# Patient Record
Sex: Female | Born: 1950
Health system: Southern US, Community
[De-identification: ages and names within clinical notes are randomized; demographics above are authoritative.]

## PROBLEM LIST (undated history)

## (undated) DIAGNOSIS — T7840XA Allergy, unspecified, initial encounter: Secondary | ICD-10-CM

## (undated) DIAGNOSIS — I10 Essential (primary) hypertension: Secondary | ICD-10-CM

## (undated) DIAGNOSIS — E119 Type 2 diabetes mellitus without complications: Secondary | ICD-10-CM

## (undated) DIAGNOSIS — R42 Dizziness and giddiness: Secondary | ICD-10-CM

## (undated) DIAGNOSIS — Z5189 Encounter for other specified aftercare: Secondary | ICD-10-CM

## (undated) DIAGNOSIS — T8859XA Other complications of anesthesia, initial encounter: Secondary | ICD-10-CM

## (undated) DIAGNOSIS — M1990 Unspecified osteoarthritis, unspecified site: Secondary | ICD-10-CM

## (undated) HISTORY — PX: FOOT SURGERY: SHX648

## (undated) HISTORY — DX: Encounter for other specified aftercare: Z51.89

## (undated) HISTORY — PX: ABDOMINAL HYSTERECTOMY: SHX81

## (undated) HISTORY — PX: TONSILLECTOMY: SUR1361

## (undated) HISTORY — DX: Allergy, unspecified, initial encounter: T78.40XA

---

## 2000-09-05 ENCOUNTER — Emergency Department (HOSPITAL_COMMUNITY): Admission: EM | Admit: 2000-09-05 | Discharge: 2000-09-05 | Payer: Self-pay | Admitting: *Deleted

## 2000-10-05 ENCOUNTER — Ambulatory Visit (HOSPITAL_COMMUNITY): Admission: RE | Admit: 2000-10-05 | Discharge: 2000-10-05 | Payer: Self-pay | Admitting: Internal Medicine

## 2001-03-16 ENCOUNTER — Encounter: Payer: Self-pay | Admitting: *Deleted

## 2001-03-16 ENCOUNTER — Encounter: Payer: Self-pay | Admitting: Emergency Medicine

## 2001-03-16 ENCOUNTER — Emergency Department (HOSPITAL_COMMUNITY): Admission: EM | Admit: 2001-03-16 | Discharge: 2001-03-16 | Payer: Self-pay | Admitting: *Deleted

## 2001-03-28 ENCOUNTER — Encounter: Payer: Self-pay | Admitting: Emergency Medicine

## 2001-03-28 ENCOUNTER — Emergency Department (HOSPITAL_COMMUNITY): Admission: EM | Admit: 2001-03-28 | Discharge: 2001-03-28 | Payer: Self-pay | Admitting: Emergency Medicine

## 2011-07-31 ENCOUNTER — Other Ambulatory Visit (HOSPITAL_COMMUNITY): Payer: Self-pay | Admitting: Internal Medicine

## 2011-07-31 DIAGNOSIS — Z1231 Encounter for screening mammogram for malignant neoplasm of breast: Secondary | ICD-10-CM

## 2011-08-26 ENCOUNTER — Ambulatory Visit (HOSPITAL_COMMUNITY): Payer: Self-pay

## 2011-09-16 ENCOUNTER — Ambulatory Visit (HOSPITAL_COMMUNITY): Payer: Self-pay | Attending: Internal Medicine

## 2012-05-03 ENCOUNTER — Encounter (HOSPITAL_BASED_OUTPATIENT_CLINIC_OR_DEPARTMENT_OTHER): Payer: Self-pay | Admitting: *Deleted

## 2012-05-03 ENCOUNTER — Emergency Department (HOSPITAL_BASED_OUTPATIENT_CLINIC_OR_DEPARTMENT_OTHER)
Admission: EM | Admit: 2012-05-03 | Discharge: 2012-05-03 | Disposition: A | Discharge status: Home / Self Care | Payer: Self-pay | Attending: Emergency Medicine | Admitting: Emergency Medicine

## 2012-05-03 ENCOUNTER — Emergency Department (HOSPITAL_BASED_OUTPATIENT_CLINIC_OR_DEPARTMENT_OTHER): Payer: Self-pay

## 2012-05-03 DIAGNOSIS — J9801 Acute bronchospasm: Secondary | ICD-10-CM | POA: Insufficient documentation

## 2012-05-03 DIAGNOSIS — B349 Viral infection, unspecified: Secondary | ICD-10-CM

## 2012-05-03 DIAGNOSIS — R05 Cough: Secondary | ICD-10-CM | POA: Insufficient documentation

## 2012-05-03 DIAGNOSIS — J3489 Other specified disorders of nose and nasal sinuses: Secondary | ICD-10-CM | POA: Insufficient documentation

## 2012-05-03 DIAGNOSIS — Z79899 Other long term (current) drug therapy: Secondary | ICD-10-CM | POA: Insufficient documentation

## 2012-05-03 DIAGNOSIS — I10 Essential (primary) hypertension: Secondary | ICD-10-CM | POA: Insufficient documentation

## 2012-05-03 DIAGNOSIS — R059 Cough, unspecified: Secondary | ICD-10-CM | POA: Insufficient documentation

## 2012-05-03 DIAGNOSIS — R062 Wheezing: Secondary | ICD-10-CM | POA: Insufficient documentation

## 2012-05-03 DIAGNOSIS — Z87891 Personal history of nicotine dependence: Secondary | ICD-10-CM | POA: Insufficient documentation

## 2012-05-03 DIAGNOSIS — E119 Type 2 diabetes mellitus without complications: Secondary | ICD-10-CM | POA: Insufficient documentation

## 2012-05-03 DIAGNOSIS — B9789 Other viral agents as the cause of diseases classified elsewhere: Secondary | ICD-10-CM | POA: Insufficient documentation

## 2012-05-03 HISTORY — DX: Dizziness and giddiness: R42

## 2012-05-03 HISTORY — DX: Essential (primary) hypertension: I10

## 2012-05-03 HISTORY — DX: Type 2 diabetes mellitus without complications: E11.9

## 2012-05-03 MED ORDER — ALBUTEROL SULFATE HFA 108 (90 BASE) MCG/ACT IN AERS
2.0000 | INHALATION_SPRAY | Freq: Once | RESPIRATORY_TRACT | Status: AC
  Administered 2012-05-03: 2 via RESPIRATORY_TRACT

## 2012-05-03 MED ORDER — PREDNISONE 20 MG PO TABS: ORAL_TABLET | ORAL | Status: DC

## 2012-05-03 MED ORDER — PREDNISONE 50 MG PO TABS
60.0000 mg | ORAL_TABLET | Freq: Once | ORAL | Status: AC
  Administered 2012-05-03: 60 mg via ORAL
  Filled 2012-05-03: qty 1

## 2012-05-03 MED ORDER — ALBUTEROL SULFATE HFA 108 (90 BASE) MCG/ACT IN AERS
INHALATION_SPRAY | RESPIRATORY_TRACT | Status: AC
  Filled 2012-05-03: qty 6.7

## 2012-05-03 NOTE — ED Notes (Signed)
Pt EMS transport for work= c/o SOB- 5 albuterol given pta

## 2012-05-03 NOTE — ED Provider Notes (Signed)
History     CSN: [id number]  Arrival date & time 05/03/12  0941   First MD Initiated Contact with Patient 05/03/12 [phone]      Chief Complaint  Patient presents with  . Shortness of Breath    (Consider location/radiation/quality/duration/timing/severity/associated sxs/prior treatment) HPI This 62 year old female has chronic nasal congestion but today history of increasing clear rhinorrhea and increasing is a congestion and a nonproductive cough and this morning felt like she was wheezing a little short of breath, EMS found her with a normal room-air pulse oximetry 100% for wheezing on examination which cleared with one albuterol nebulizer prior to arrival, patient is no longer short of breath, she does have a slight twinge of right posterior back pain with coughing or taking a deep breath but no anterior chest pain no exertional chest pain no abdominal pain no vomiting no diarrhea no fever no rashes no body aches no confusion. Treatment prior to arrival consisted of albuterol nebulizer. Patient is still coughing upon arrival but feels much better. Past Medical History  Diagnosis Date  . Hypertension   . Vertigo   . Diabetes mellitus without complication     "pre-diabetes"    Past Surgical History  Procedure Laterality Date  . Tonsillectomy    . Foot surgery    . Abdominal hysterectomy      No family history on file.  History  Substance Use Topics  . Smoking status: Former Games developer  . Smokeless tobacco: Never Used  . Alcohol Use: No    OB History   Grav Para Term Preterm Abortions TAB SAB Ect Mult Living                  Review of Systems 10 Systems reviewed and are negative for acute change except as noted in the HPI. Allergies  Red dye  Home Medications   Current Outpatient Rx  Name  Route  Sig  Dispense  Refill  . lisinopril (PRINIVIL,ZESTRIL) 10 MG tablet   Oral   Take 20 mg by mouth daily.         . predniSONE (DELTASONE) 20 MG tablet      2 tabs po  daily x 3 days   6 tablet   0     BP 135/86  Pulse 78  Temp(Src) 98.1 F (36.7 C) (Oral)  Resp 16  SpO2 99%  Physical Exam  Nursing note and vitals reviewed. Constitutional:  Awake, alert, nontoxic appearance.  HENT:  Head: Atraumatic.  Mouth/Throat: Oropharynx is clear and moist.  Eyes: Right eye exhibits no discharge. Left eye exhibits no discharge.  Neck: Neck supple.  Cardiovascular: Normal rate and regular rhythm.   No murmur heard. Pulmonary/Chest: Effort normal and breath sounds normal. No respiratory distress. She has no wheezes. She has no rales. She exhibits no tenderness.  Patient is still coughing upon arrival to the ED with slight prolonged exp phase but no more overt wheezing since neb from EMS  Abdominal: Soft. Bowel sounds are normal. She exhibits no distension and no mass. There is no tenderness. There is no rebound and no guarding.  Musculoskeletal: She exhibits no edema and no tenderness.  Baseline ROM, no obvious new focal weakness.  Neurological: She is alert.  Mental status and motor strength appears baseline for patient and situation.  Skin: No rash noted.  Psychiatric: She has a normal mood and affect.    ED Course  Procedures (including critical care time) Pt feels improved after observation and/or treatment in  ED. Labs Reviewed - No data to display No results found.   1. Acute bronchospasm due to viral infection       MDM   Patient / Family / Caregiver informed of clinical course, understand medical decision-making process, and agree with plan.  I doubt any other EMC precluding discharge at this time including, but not necessarily limited to the following: Sepsis.       Hurman Horn, MD 05/07/12 [phone]

## 2012-05-03 NOTE — ED Notes (Signed)
Patient arrived to ED via EMS receiving a nebulizer treatment with Albuterol 5.0 mg. Patient states she has no history of pulmonary disease. BBS-clear and patient is currently in no acute distress. Patient is stable and MD is currently at bedside assessing patient. Rt will continue to monitor.

## 2012-05-03 NOTE — ED Notes (Signed)
Patient transported to X-ray 

## 2012-06-28 ENCOUNTER — Ambulatory Visit: Payer: Self-pay | Admitting: Internal Medicine

## 2014-03-28 ENCOUNTER — Ambulatory Visit: Payer: Self-pay | Admitting: Family Medicine

## 2014-05-07 ENCOUNTER — Ambulatory Visit (INDEPENDENT_AMBULATORY_CARE_PROVIDER_SITE_OTHER): Payer: Managed Care, Other (non HMO) | Admitting: Family Medicine

## 2014-05-07 VITALS — BP 156/92 | HR 75 | Temp 97.7°F | Resp 19 | Ht 64.0 in | Wt 201.6 lb

## 2014-05-07 DIAGNOSIS — I10 Essential (primary) hypertension: Secondary | ICD-10-CM | POA: Insufficient documentation

## 2014-05-07 DIAGNOSIS — Z8639 Personal history of other endocrine, nutritional and metabolic disease: Secondary | ICD-10-CM

## 2014-05-07 DIAGNOSIS — E669 Obesity, unspecified: Secondary | ICD-10-CM

## 2014-05-07 DIAGNOSIS — R42 Dizziness and giddiness: Secondary | ICD-10-CM

## 2014-05-07 LAB — POCT URINALYSIS DIPSTICK
BILIRUBIN UA: NEGATIVE
Blood, UA: NEGATIVE
GLUCOSE UA: NEGATIVE
Ketones, UA: NEGATIVE
LEUKOCYTES UA: NEGATIVE
Nitrite, UA: NEGATIVE
Protein, UA: NEGATIVE
SPEC GRAV UA: 1.025
Urobilinogen, UA: 0.2
pH, UA: 5

## 2014-05-07 LAB — COMPLETE METABOLIC PANEL WITH GFR
ALT: 16 U/L (ref 0–35)
AST: 14 U/L (ref 0–37)
Albumin: 4.1 g/dL (ref 3.5–5.2)
Alkaline Phosphatase: 85 U/L (ref 39–117)
BILIRUBIN TOTAL: 0.3 mg/dL (ref 0.2–1.2)
BUN: 21 mg/dL (ref 6–23)
CALCIUM: 9.7 mg/dL (ref 8.4–10.5)
CO2: 26 mEq/L (ref 19–32)
Chloride: 105 mEq/L (ref 96–112)
Creat: 0.75 mg/dL (ref 0.50–1.10)
GFR, Est African American: >89 mL/min
GFR, Est Non African American: 85 mL/min
Glucose, Bld: 106 mg/dL — ABNORMAL HIGH (ref 70–99)
Potassium: 4.5 mEq/L (ref 3.5–5.3)
Sodium: 139 mEq/L (ref 135–145)
Total Protein: 7.4 g/dL (ref 6.0–8.3)

## 2014-05-07 LAB — POCT CBC
Granulocyte percent: 67.9 %G (ref 37–80)
HCT, POC: 40.9 % (ref 37.7–47.9)
Hemoglobin: 13.1 g/dL (ref 12.2–16.2)
LYMPH, POC: 1.7 (ref 0.6–3.4)
MCH: 29.3 pg (ref 27–31.2)
MCHC: 32.1 g/dL (ref 31.8–35.4)
MCV: 91.1 fL (ref 80–97)
MID (cbc): 0.3 (ref 0–0.9)
MPV: 8.8 fL (ref 0–99.8)
POC Granulocyte: 4.2 (ref 2–6.9)
POC LYMPH PERCENT: 26.9 %L (ref 10–50)
POC MID %: 5.2 %M (ref 0–12)
Platelet Count, POC: 239 10*3/uL (ref 142–424)
RBC: 4.49 M/uL (ref 4.04–5.48)
RDW, POC: 14.3 %
WBC: 6.2 10*3/uL (ref 4.6–10.2)

## 2014-05-07 LAB — LIPID PANEL
Cholesterol: 299 mg/dL — ABNORMAL HIGH (ref 0–200)
HDL: 88 mg/dL (ref >39)
LDL CALC: 196 mg/dL — AB (ref 0–99)
TRIGLYCERIDES: 76 mg/dL (ref <150)
Total CHOL/HDL Ratio: 3.4 Ratio
VLDL: 15 mg/dL (ref 0–40)

## 2014-05-07 LAB — POCT GLYCOSYLATED HEMOGLOBIN (HGB A1C): Hemoglobin A1C: 5.9

## 2014-05-07 LAB — GLUCOSE, POCT (MANUAL RESULT ENTRY): POC Glucose: 103 mg/dl — AB (ref 70–99)

## 2014-05-07 LAB — TSH: TSH: 1.171 u[IU]/mL (ref 0.350–4.500)

## 2014-05-07 MED ORDER — AMLODIPINE BESYLATE 10 MG PO TABS: 10.0000 mg | ORAL_TABLET | Freq: Every day | ORAL | Status: DC

## 2014-05-07 MED ORDER — MECLIZINE HCL 25 MG PO TABS: 25.0000 mg | ORAL_TABLET | Freq: Three times a day (TID) | ORAL | Status: DC | PRN

## 2014-05-07 NOTE — Patient Instructions (Addendum)
My fitness PAL - app for weight loss and calorie intake  Watch your salt intake for your blood pressure  I will contact you with your lab results as soon as they are available.   If you have not heard from me in 2 weeks, please contact me.  The fastest way to get your results is to register for My Chart (see the instructions on the last page of this printout).

## 2014-05-07 NOTE — Progress Notes (Signed)
History and physical examinations reviewed in detail with Benny LennertSarah Weber, PA-C.  EKG reviewed.  Agree with assessment and plan.

## 2014-05-07 NOTE — Progress Notes (Signed)
Subjective:    Patient ID: Wanda Newton, female    DOB: 1950-08-10, 64 y.o.   MRN: [id number]  HPI Pt presents to clinic with interest in getting her HTN treated.  She has been treated for HTN in the past with lisinopril but the medication made her feel bad so she decided to stop the medication and she has not had medical treatment in years.  She has also been diagnosed with vertigo in the past and she had medication at home but she just ran out and she had an episode yesterday and that is what brought her to the office. She also is aware of her overweight status and wants to do something about it - she has been a bodybuilder in the past.  She has an active job but does not do outside exercise.   Review of Systems  Eyes: Negative for visual disturbance.  Cardiovascular: Negative for chest pain, palpitations and leg swelling.  Neurological: Positive for dizziness (vertigo). Negative for light-headedness and headaches.    Patient Active Problem List   Diagnosis Date Noted  . Benign essential HTN 05/07/2014   Prior to Admission medications   Medication Sig Start Date End Date Taking? Authorizing Provider       Morrell Riddle, PA-C  meclizine (ANTIVERT) 25 MG tablet Take 1 tablet (25 mg total) by mouth 3 (three) times daily as needed for dizziness. 05/07/14   Morrell Riddle, PA-C   Allergies  Allergen Reactions  . Red Dye Shortness Of Breath   Medications, allergies, past medical history, surgical history, family history, social history and problem list reviewed and updated.     Objective:   Physical Exam  Constitutional: She is oriented to person, place, and time. She appears well-developed and well-nourished.  BP 156/92 mmHg  Pulse 75  Temp(Src) 97.7 F (36.5 C) (Oral)  Resp 19  Ht  (1.626 m)  Wt 201 lb 9.6 oz (91.445 kg)  BMI 34.59 kg/m2  SpO2 97%   HENT:  Head: Normocephalic and atraumatic.  Right Ear: External ear normal.  Left Ear: External ear normal.  Eyes:  Conjunctivae are normal.  Neck: Normal range of motion. No tracheal deviation present. No thyromegaly present.  Cardiovascular: Normal rate, regular rhythm and normal heart sounds.   No murmur heard. Pulmonary/Chest: Effort normal and breath sounds normal. She has no wheezes.  Neurological: She is alert and oriented to person, place, and time. She has normal strength and normal reflexes. No sensory deficit.  Skin: Skin is warm and dry.  Psychiatric: She has a normal mood and affect. Her behavior is normal. Judgment and thought content normal.   EKG - NSR without acute changes - read with Dr Katrinka Blazing    Assessment & Plan:  Benign essential HTN - Normal EKG today - restart medication Plan: POCT CBC, POCT urinalysis dipstick, EKG 12-Lead, COMPLETE METABOLIC PANEL WITH GFR, Lipid panel, TSH, amLODipine (NORVASC) 10 MG tablet  Vertigo - Plan: meclizine (ANTIVERT) 25 MG tablet  Obesity, Class I, BMI 30-34.9 - D/w pt ways to lose weight and what she could do for weight loss.  She plans to keep track of her food intake.  Plan: Lipid panel  History of elevated glucose - slightly elevated today but normal A1C - d/w pt that we will monitor and she should decrease her intake of simple carbs and sugars.  Plan: POCT glycosylated hemoglobin (Hb A1C), POCT glucose (manual entry)  F/u in 1 month with me  Maralyn Sago  Weber PA-C  [facility] 05/07/2014 3:04 PM

## 2014-05-29 ENCOUNTER — Telehealth: Payer: Self-pay

## 2014-05-29 ENCOUNTER — Ambulatory Visit (INDEPENDENT_AMBULATORY_CARE_PROVIDER_SITE_OTHER): Payer: Managed Care, Other (non HMO) | Admitting: Physician Assistant

## 2014-05-29 VITALS — BP 140/82 | HR 66 | Temp 97.9°F | Resp 16 | Ht 64.0 in | Wt 207.0 lb

## 2014-05-29 DIAGNOSIS — R600 Localized edema: Secondary | ICD-10-CM

## 2014-05-29 DIAGNOSIS — I10 Essential (primary) hypertension: Secondary | ICD-10-CM

## 2014-05-29 MED ORDER — HYDROCHLOROTHIAZIDE 12.5 MG PO TABS: 12.5000 mg | ORAL_TABLET | Freq: Every day | ORAL | Status: DC

## 2014-05-29 NOTE — Telephone Encounter (Signed)
PATIENT STATES SHE CAME INTO THE OFFICE AND SAW DR. Katrinka BlazingSMITH FOR HIGH BLOOD PRESSURE IN FEB. 2016. SHE WAS STARTED ON NEW MEDICATION AND WAS TOLD TO WATCH OUT FOR SWELLING OF HER FEET. LAST NIGHT SHE DOES NOT HAVE SWELLING OF HER FEET, BUT BOTH OF HER LEGS ARE VERY SWOLLEN AND TIGHT. SHE THINKS IT MAY BE FROM THE NORVASC 10 MG. SHE WOULD LIKE TO KNOW WHAT SHE SHOULD DO NEXT? SHE IS TRYING TO WORK. BEST PHONE [phone] (CELL)  PHARMACY CHOICE IS [facility] ON [address].  MBC

## 2014-05-29 NOTE — Progress Notes (Signed)
Subjective:    Patient ID: Wanda PoseyMary L Hain, female    DOB: 04/10/1950, 64 y.o.   MRN: [id number]  Chief Complaint  Patient presents with  . Leg Swelling   Patient Active Problem List   Diagnosis Date Noted  . Benign essential HTN 05/07/2014   Medications, allergies, past medical history, surgical history, family history, social history and problem list reviewed and updated.   HPI  Pt seen by Benny LennertSarah Weber 3 wks ago. Started on amlodipine 10 at that time for htn.   Approx 3-4 days ago started noticing bilateral le swelling. Throughout the day. No pain. Just felt socks and shoes were little tighter than normal. Denies leg pain. Denies unilateral pain, swelling. Denies recent immobilization.   Has had htn for several yrs. Had been on lisinopril for htn which she felt worked well but had some nausea with the med so stopped taking it couple yrs ago. Had not been on any bp meds from then until she started the amlodipine 3 wks ago. 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 She never took it for htn. She tolerated this med well. Stopped it approx 10 yrs ago.   She doesn't check bp at home. Last visit was 156/92, today is 140/82. Denies cp, sob, presyncope, syncope, palps, vision changes, ha.   Review of Systems See HPI.     Objective:   Physical Exam  Constitutional: She appears well-developed and well-nourished.  Non-toxic appearance. She does not have a sickly appearance. She does not appear ill. No distress.  BP 140/82 mmHg  Pulse 66  Temp(Src) 97.9 F (36.6 C) (Oral)  Resp 16  Ht 5\' 4"  (1.626 m)  Wt 207 lb (93.895 kg)  BMI 35.51 kg/m2  SpO2 98%   Neck: No hepatojugular reflux and no JVD present. Carotid bruit is not present.  Cardiovascular: Normal rate, regular rhythm and normal heart sounds.  Exam reveals no gallop, no S3 and no S4.   No murmur heard. Pulses:      Dorsalis pedis pulses are 2+ on the right side, and 2+ on the  left side.       Posterior tibial pulses are 2+ on the right side, and 2+ on the left side.  Mild swelling le bilaterally around upper feet, ankles, lower calves. Non pitting. No erythema. No discoloration.   Pulmonary/Chest: Effort normal and breath sounds normal. She has no decreased breath sounds. She has no wheezes. She has no rhonchi. She has no rales.      Assessment & Plan:   Pt seen by Benny LennertSarah Weber 3 wks ago. Started on amlodipine 10 at that time for htn.   Benign essential HTN Bilateral lower extremity edema - Plan: hydrochlorothiazide (HYDRODIURIL) 12.5 MG tablet --bilateral le edema - doubt from chf with no hx cad, no S4 on exam, no jvd, had normal cmp/ua 3 wks ago so doubt due to nephrotic syndrome or liver failure, normal tsh doubt thyroid cause --doubt dvt with no unilateral pain/swelling --most likely secondary to ccb which she started 3 wks ago --pt given option of trying acei again since se was nausea, starting arb, or starting hctz as she tolerated that well 10 yrs ago --starting hctz 12.5 mg qd --normal k/bun/cr 3 wks ago --f/u with Sarah 1-2 wks as per notes from previous appt with Cheral MarkerSarah  Peggie Hornak M. Emerald Shor, PA-C Physician Assistant-Certified [facility]  05/30/2014  12:15 PM

## 2014-05-29 NOTE — Telephone Encounter (Signed)
Patient has presented to office for evaluation.

## 2014-05-29 NOTE — Patient Instructions (Signed)
Please stop taking the amlodipine and start taking the hctz once daily. We're starting you at 12.5 mg once day. Please plan to follow up with Sarah in one month as previously advised and we will see how your bp is doing at that visit. We may increase the dose.  Be sure to limit your salt intake, be sure to drink plenty of water, be sure to elevate your legs when you are resting at home. Please wear compression stockings when you are going to be on your feet all day. This helps to get the blood flow back up from your legs.

## 2014-05-30 ENCOUNTER — Encounter: Payer: Self-pay | Admitting: Physician Assistant

## 2014-05-31 ENCOUNTER — Telehealth: Payer: Self-pay

## 2014-05-31 NOTE — Telephone Encounter (Signed)
Patient dropped off FMLA paperwork on to be filled out byTodd Home DepotM McVeigh, [place]   in 5-7 business days. Please return to Disability Department upon Completion.. I have scanned blank copies into Epic and set up a release under release # [id number]. Once completed Jasmine or myself will Scan Completed paperwork into this release and fax through Epic to [phone]. Release is currently on hold pending $15 payment.

## 2014-06-03 NOTE — Telephone Encounter (Signed)
Why does she need FMLA?

## 2014-06-05 NOTE — Telephone Encounter (Signed)
Pt did not fill out a request form giving us more specific details on why she needs FMLA , I attempted reaching pt on 3/9 as well as 3/14 with no success, and unable to leave VM. Pt was seen on 3/7 by Raelyn Ensignodd McVeigh If pt did not clarify during this visit her possible needs for FMLA, and we do not hear back from her, I can leave this on hold, and the ppw is on file.

## 2014-06-05 NOTE — Telephone Encounter (Signed)
Thank you! We will call and let the patient know and refund the $15.00 she paid to have the paperwork completed.

## 2014-06-05 NOTE — Telephone Encounter (Signed)
FMLA was not discussed at her appt.  Her medical conditions should not warrant FMLA need.

## 2014-06-09 DIAGNOSIS — Z0271 Encounter for disability determination: Secondary | ICD-10-CM

## 2014-06-13 ENCOUNTER — Telehealth: Payer: Self-pay

## 2014-06-13 NOTE — Telephone Encounter (Signed)
Pt called in regards to FMLA forms. There was some confusion as to why she was needing this. Pt states she saw Donnajean Lopesodd M McVeigh, PA at 05/29/2014 4:49 PM for swelling of the leg due to medication Benny LennertSarah Weber, PA had prescribed her previously. Pt states that she was not put out of work for this, but she left work to see a Dr. For this. Would like to know if the paper work can now be filled out. She was unaware at time of visit that FMLA would be required, and we have been unable to get a hold her sense filling out request form on 3/9. Blank copies of paperwork have been scanned into media tab through epic. Please advise   Best # [id number]  [phone]

## 2014-06-14 NOTE — Telephone Encounter (Signed)
I want to clarify that the FMLA need to be for the day she saw Tawanna Coolerodd because she left work?  I am happy to do this for the patient.

## 2014-06-15 ENCOUNTER — Encounter: Payer: Self-pay | Admitting: Physician Assistant

## 2014-06-15 ENCOUNTER — Ambulatory Visit (INDEPENDENT_AMBULATORY_CARE_PROVIDER_SITE_OTHER): Payer: Managed Care, Other (non HMO) | Admitting: Physician Assistant

## 2014-06-15 VITALS — BP 136/80 | HR 91 | Temp 98.5°F | Resp 18 | Ht 63.5 in | Wt 203.0 lb

## 2014-06-15 DIAGNOSIS — R600 Localized edema: Secondary | ICD-10-CM

## 2014-06-15 DIAGNOSIS — R42 Dizziness and giddiness: Secondary | ICD-10-CM

## 2014-06-15 MED ORDER — HYDROCHLOROTHIAZIDE 25 MG PO TABS: 25.0000 mg | ORAL_TABLET | Freq: Every day | ORAL | Status: DC

## 2014-06-15 NOTE — Telephone Encounter (Signed)
Copied paperwork, left message on pt's voicemail letting her know forms are ready to be picked up.

## 2014-06-15 NOTE — Progress Notes (Signed)
   Subjective:    Patient ID: Wanda PoseyMary L Newton, female    DOB: 06/22/1950, 64 y.o.   MRN: [id number]  HPI Pt presents to clinic for a recheck of her BP and leg swelling.  Since stopping the Norvasc, the swelling has completely resolved.  She is tolerating the HCTZ without problems.  She has been wearing the compression socks and they have helped.  She is still wanting to lose weight and is getting ready to see a nutritionist.  Patient Active Problem List   Diagnosis Date Noted  . Vertigo 06/15/2014  . Benign essential HTN 05/07/2014   Prior to Admission medications   Medication Sig Start Date End Date Taking? Authorizing Provider  hydrochlorothiazide (HYDRODIURIL) 25 MG tablet Take 1 tablet (25 mg total) by mouth daily. 06/15/14  Yes Morrell RiddleSarah L Gwen Edler, PA-C  meclizine (ANTIVERT) 25 MG tablet Take 1 tablet (25 mg total) by mouth 3 (three) times daily as needed for dizziness. 05/07/14  Yes Morrell RiddleSarah L Castle Lamons, PA-C   Allergies  Allergen Reactions  . Red Dye Shortness Of Breath  . Amlodipine Swelling    Bilateral LE swelling with medication.     Medications, allergies, past medical history, surgical history, family history, social history and problem list reviewed and updated.   Review of Systems  Constitutional: Negative.   Respiratory: Negative for cough.   Cardiovascular: Negative for chest pain and leg swelling.       Objective:   Physical Exam  Constitutional: She is oriented to person, place, and time. She appears well-developed and well-nourished.  BP 136/80 mmHg  Pulse 91  Temp(Src) 98.5 F (36.9 C) (Oral)  Resp 18  Ht 5' 3.5" (1.613 m)  Wt 203 lb (92.08 kg)  BMI 35.39 kg/m2  SpO2 96%   HENT:  Head: Normocephalic and atraumatic.  Right Ear: External ear normal.  Left Ear: External ear normal.  Cardiovascular: Normal rate, regular rhythm and normal heart sounds.   No murmur heard. Pulmonary/Chest: Effort normal and breath sounds normal. She has no wheezes.  Neurological: She  is alert and oriented to person, place, and time.  Skin: Skin is warm and dry.  Psychiatric: She has a normal mood and affect. Her behavior is normal. Judgment and thought content normal.       Assessment & Plan:  Bilateral lower extremity edema - Plan: hydrochlorothiazide (HYDRODIURIL) 25 MG tablet - continue the above medication but will increase the dosage due to not full HTN control at this time.  She will RTC in 6-8 weeks.    Benny LennertSarah Kaleya Douse PA-C  [facility] 06/15/2014 2:44 PM

## 2014-06-15 NOTE — Telephone Encounter (Signed)
Patient came in to be seen today and asked about her FMLA paper work. She was inform that it has not been forgotten about and that it is still in the process of being completed. Patient states that her employer's HR has been bugging her about turning in the paper work and that we can fax the forms once they're finish or she can come pick them up.

## 2014-06-15 NOTE — Telephone Encounter (Signed)
FMLA is filled out and in your box at [employer]

## 2014-06-20 NOTE — Progress Notes (Signed)
  Medical screening examination/treatment/procedure(s) were performed by non-physician practitioner and as supervising physician I was immediately available for consultation/collaboration.     

## 2014-08-16 ENCOUNTER — Encounter: Payer: Self-pay | Admitting: Physician Assistant

## 2014-08-16 ENCOUNTER — Ambulatory Visit (INDEPENDENT_AMBULATORY_CARE_PROVIDER_SITE_OTHER): Payer: Managed Care, Other (non HMO) | Admitting: Physician Assistant

## 2014-08-16 VITALS — BP 131/77 | HR 81 | Temp 98.4°F | Resp 16 | Ht 64.5 in | Wt 200.0 lb

## 2014-08-16 DIAGNOSIS — B351 Tinea unguium: Secondary | ICD-10-CM | POA: Diagnosis not present

## 2014-08-16 DIAGNOSIS — Z1239 Encounter for other screening for malignant neoplasm of breast: Secondary | ICD-10-CM

## 2014-08-16 DIAGNOSIS — R52 Pain, unspecified: Secondary | ICD-10-CM | POA: Diagnosis not present

## 2014-08-16 DIAGNOSIS — R600 Localized edema: Secondary | ICD-10-CM | POA: Diagnosis not present

## 2014-08-16 DIAGNOSIS — I10 Essential (primary) hypertension: Secondary | ICD-10-CM | POA: Diagnosis not present

## 2014-08-16 LAB — HEPATIC FUNCTION PANEL
ALK PHOS: 79 U/L (ref 39–117)
ALT: 18 U/L (ref 0–35)
AST: 16 U/L (ref 0–37)
Albumin: 4.1 g/dL (ref 3.5–5.2)
BILIRUBIN DIRECT: 0.1 mg/dL (ref 0.0–0.3)
BILIRUBIN TOTAL: 0.3 mg/dL (ref 0.2–1.2)
Indirect Bilirubin: 0.2 mg/dL (ref 0.2–1.2)
TOTAL PROTEIN: 7.4 g/dL (ref 6.0–8.3)

## 2014-08-16 MED ORDER — HYDROCHLOROTHIAZIDE 25 MG PO TABS: 25.0000 mg | ORAL_TABLET | Freq: Every day | ORAL | Status: DC

## 2014-08-16 MED ORDER — TERBINAFINE HCL 250 MG PO TABS: 250.0000 mg | ORAL_TABLET | Freq: Every day | ORAL | Status: DC

## 2014-08-16 NOTE — Progress Notes (Signed)
   Subjective:    Patient ID: Wanda PoseyMary L Newton, female    DOB: 02/06/1951, 64 y.o.   MRN: [id number]  HPI Pt presents to clinic for recheck of her HTN.  She is doing well.  She started wearing compression socks and that has helped her swelling and her leg tiredness.  She feels really good.  She is getting married and happy about that.  She has toenail fungus that bothers her - her nails are getting thick and they look terrible she would like to know what she can do about them.  Review of Systems  Constitutional: Negative for fever and chills.  Respiratory: Negative for shortness of breath.   Cardiovascular: Negative for chest pain, palpitations and leg swelling.       Objective:   Physical Exam  Constitutional: She is oriented to person, place, and time. She appears well-developed and well-nourished.  BP 131/77 mmHg  Pulse 81  Temp(Src) 98.4 F (36.9 C)  Resp 16  Ht 5' 4.5" (1.638 m)  Wt 200 lb (90.719 kg)  BMI 33.81 kg/m2  SpO2 97%   HENT:  Head: Normocephalic and atraumatic.  Right Ear: External ear normal.  Left Ear: External ear normal.  Eyes: Conjunctivae are normal.  Neck: Normal range of motion.  Cardiovascular: Normal rate, regular rhythm, normal heart sounds and intact distal pulses.   No murmur heard. Pulmonary/Chest: Effort normal and breath sounds normal.  Neurological: She is alert and oriented to person, place, and time.  Skin: Skin is warm and dry.  Thickened onychomycotic toenails all 5 on her left foot  Psychiatric: She has a normal mood and affect. Her behavior is normal. Judgment and thought content normal.      Assessment & Plan:  Benign essential HTN - Plan: hydrochlorothiazide (HYDRODIURIL) 25 MG tablet  Onychomycosis - Plan: Hepatic Function Panel, terbinafine (LAMISIL) 250 MG tablet  Bilateral lower extremity edema  Screening for breast cancer - Plan: MM Digital Screening   Pt will see me for her CPE at her next visit due to no screening exam for  many years.  We will order her mammo at this visit.  Benny LennertSarah Yanissa Michalsky PA-C  [facility] 08/16/2014 1:39 PM

## 2014-08-16 NOTE — Patient Instructions (Signed)
The medication lamisil is at your pharmacy if you want to get it filled.  If you fill it you will need to have you liver enzymes checked after 6 weeks.  Please mychart message me and I will have that order in the computer and you will be able to come into the office and just have your blood drawn.  I will see you in 6months unless you need me before then.

## 2014-10-23 ENCOUNTER — Encounter: Payer: Self-pay | Admitting: *Deleted

## 2014-10-30 ENCOUNTER — Other Ambulatory Visit: Payer: Self-pay | Admitting: Physician Assistant

## 2014-11-02 ENCOUNTER — Other Ambulatory Visit: Payer: Self-pay

## 2014-11-02 DIAGNOSIS — I10 Essential (primary) hypertension: Secondary | ICD-10-CM

## 2014-11-02 MED ORDER — HYDROCHLOROTHIAZIDE 25 MG PO TABS: 25.0000 mg | ORAL_TABLET | Freq: Every day | ORAL | Status: DC

## 2014-12-02 ENCOUNTER — Telehealth: Payer: Self-pay | Admitting: Family Medicine

## 2014-12-02 NOTE — Telephone Encounter (Signed)
Please inform patient that her appointment has been changed from 02/15/15 please make another OV 6 month f/u with Benny Lennert

## 2014-12-04 ENCOUNTER — Telehealth: Payer: Self-pay | Admitting: Family Medicine

## 2014-12-04 NOTE — Telephone Encounter (Signed)
Please inform patient that her appointment has been changed from 02/15/15 please make another OV 6 month f/u with sarah weber 

## 2015-01-29 ENCOUNTER — Telehealth: Payer: Self-pay

## 2015-01-29 DIAGNOSIS — I10 Essential (primary) hypertension: Secondary | ICD-10-CM

## 2015-01-29 MED ORDER — HYDROCHLOROTHIAZIDE 25 MG PO TABS: 25.0000 mg | ORAL_TABLET | Freq: Every day | ORAL | Status: DC

## 2015-01-29 NOTE — Telephone Encounter (Signed)
Sent Rx to pharmacy for 2 months only.

## 2015-01-29 NOTE — Telephone Encounter (Signed)
Pt is needing a refill on her blood pressure medication she has an appt on 03/15/15

## 2015-02-15 ENCOUNTER — Ambulatory Visit: Payer: Managed Care, Other (non HMO) | Admitting: Physician Assistant

## 2015-03-15 ENCOUNTER — Ambulatory Visit: Payer: Managed Care, Other (non HMO) | Admitting: Physician Assistant

## 2015-03-21 ENCOUNTER — Other Ambulatory Visit: Payer: Self-pay | Admitting: Physician Assistant

## 2015-04-06 ENCOUNTER — Telehealth: Payer: Self-pay | Admitting: Family Medicine

## 2015-04-06 NOTE — Telephone Encounter (Signed)
Left a message for patient to return call to find out if she has had a mammogram or a colonoscopy.  If she has where and when?   If not we need to schedule this for her before her CPE with Maralyn SagoSarah.

## 2015-04-12 ENCOUNTER — Encounter: Payer: Self-pay | Admitting: Physician Assistant

## 2015-04-12 ENCOUNTER — Ambulatory Visit (INDEPENDENT_AMBULATORY_CARE_PROVIDER_SITE_OTHER): Payer: Managed Care, Other (non HMO) | Admitting: Physician Assistant

## 2015-04-12 VITALS — BP 140/90 | HR 74 | Temp 98.5°F | Resp 16 | Ht 64.0 in | Wt 206.0 lb

## 2015-04-12 DIAGNOSIS — Z23 Encounter for immunization: Secondary | ICD-10-CM

## 2015-04-12 DIAGNOSIS — E669 Obesity, unspecified: Secondary | ICD-10-CM

## 2015-04-12 DIAGNOSIS — Z1211 Encounter for screening for malignant neoplasm of colon: Secondary | ICD-10-CM | POA: Diagnosis not present

## 2015-04-12 DIAGNOSIS — Z114 Encounter for screening for human immunodeficiency virus [HIV]: Secondary | ICD-10-CM

## 2015-04-12 DIAGNOSIS — Z1231 Encounter for screening mammogram for malignant neoplasm of breast: Secondary | ICD-10-CM

## 2015-04-12 DIAGNOSIS — Z1159 Encounter for screening for other viral diseases: Secondary | ICD-10-CM

## 2015-04-12 DIAGNOSIS — Z0000 Encounter for general adult medical examination without abnormal findings: Secondary | ICD-10-CM

## 2015-04-12 DIAGNOSIS — R42 Dizziness and giddiness: Secondary | ICD-10-CM

## 2015-04-12 DIAGNOSIS — Z1321 Encounter for screening for nutritional disorder: Secondary | ICD-10-CM

## 2015-04-12 DIAGNOSIS — Z130 Encounter for screening for diseases of the blood and blood-forming organs and certain disorders involving the immune mechanism: Secondary | ICD-10-CM

## 2015-04-12 DIAGNOSIS — I10 Essential (primary) hypertension: Secondary | ICD-10-CM | POA: Diagnosis not present

## 2015-04-12 DIAGNOSIS — E66812 Obesity, class 2: Secondary | ICD-10-CM | POA: Insufficient documentation

## 2015-04-12 DIAGNOSIS — Z0001 Encounter for general adult medical examination with abnormal findings: Secondary | ICD-10-CM | POA: Diagnosis not present

## 2015-04-12 LAB — CBC WITH DIFFERENTIAL/PLATELET
Basophils Absolute: 0 10*3/uL (ref 0.0–0.1)
Basophils Relative: 0 % (ref 0–1)
EOS ABS: 0.3 10*3/uL (ref 0.0–0.7)
Eosinophils Relative: 4 % (ref 0–5)
HEMATOCRIT: 38.4 % (ref 36.0–46.0)
Hemoglobin: 12.8 g/dL (ref 12.0–15.0)
LYMPHS PCT: 33 % (ref 12–46)
Lymphs Abs: 2.1 10*3/uL (ref 0.7–4.0)
MCH: 29.5 pg (ref 26.0–34.0)
MCHC: 33.3 g/dL (ref 30.0–36.0)
MCV: 88.5 fL (ref 78.0–100.0)
MONOS PCT: 5 % (ref 3–12)
MPV: 11 fL (ref 8.6–12.4)
Monocytes Absolute: 0.3 10*3/uL (ref 0.1–1.0)
NEUTROS PCT: 58 % (ref 43–77)
Neutro Abs: 3.7 10*3/uL (ref 1.7–7.7)
PLATELETS: 290 10*3/uL (ref 150–400)
RBC: 4.34 MIL/uL (ref 3.87–5.11)
RDW: 13.7 % (ref 11.5–15.5)
WBC: 6.4 10*3/uL (ref 4.0–10.5)

## 2015-04-12 LAB — COMPLETE METABOLIC PANEL WITH GFR
ALT: 16 U/L (ref 6–29)
AST: 15 U/L (ref 10–35)
Albumin: 4 g/dL (ref 3.6–5.1)
Alkaline Phosphatase: 61 U/L (ref 33–130)
BUN: 20 mg/dL (ref 7–25)
CALCIUM: 9.8 mg/dL (ref 8.6–10.4)
CO2: 25 mmol/L (ref 20–31)
Chloride: 103 mmol/L (ref 98–110)
Creat: 0.73 mg/dL (ref 0.50–0.99)
GFR, Est Non African American: 87 mL/min (ref >=60)
Glucose, Bld: 96 mg/dL (ref 65–99)
Potassium: 3.7 mmol/L (ref 3.5–5.3)
Sodium: 138 mmol/L (ref 135–146)
TOTAL PROTEIN: 7.2 g/dL (ref 6.1–8.1)
Total Bilirubin: 0.3 mg/dL (ref 0.2–1.2)

## 2015-04-12 LAB — LIPID PANEL
CHOLESTEROL: 305 mg/dL — AB (ref 125–200)
HDL: 77 mg/dL (ref >=46)
LDL Cholesterol: 199 mg/dL — ABNORMAL HIGH (ref <130)
TRIGLYCERIDES: 147 mg/dL (ref <150)
Total CHOL/HDL Ratio: 4 Ratio (ref <=5.0)
VLDL: 29 mg/dL (ref <30)

## 2015-04-12 MED ORDER — HYDROCHLOROTHIAZIDE 25 MG PO TABS: 25.0000 mg | ORAL_TABLET | Freq: Every day | ORAL | Status: DC

## 2015-04-12 MED ORDER — MECLIZINE HCL 25 MG PO TABS: 25.0000 mg | ORAL_TABLET | Freq: Three times a day (TID) | ORAL | Status: DC | PRN

## 2015-04-12 NOTE — Progress Notes (Signed)
MEGAHN KILLINGS  MRN: [id number] DOB: 1950/11/06  Subjective:  Pt presents to clinic for a CPE.  She has no concerns today. Decided not to get married because leading up to the wedding she started to realize that her future husband had not changed from when she had been married to him in the past.  She realized that being around him made her stressed and anxious.  She is no longer involved with him and her insomnia and stress is all getting better. 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  She has just joined a gym and she is looking forward to getting back in shape.   She never treated her nail fungus because she felt the risks were not worth the benefits.  Last dental exam: wears contacts - 02/2015 Last vision exam: last week Last pap smear: years ago - 1999 she thinks - hyst 2nd to fibroid -  Last mammogram: years Last colonoscopy: Vaccinations      Tetanus      HPV      Zostavax  Patient Active Problem List   Diagnosis Date Noted  . Obesity, Class II, BMI 35-39.9 04/12/2015  . Vertigo 06/15/2014  . Benign essential HTN 05/07/2014    No current outpatient prescriptions on file prior to visit.   No current facility-administered medications on file prior to visit.    Allergies  Allergen Reactions  . Red Dye Shortness Of Breath  . Amlodipine Swelling    Bilateral LE swelling with medication.     Social History   Social History  . Marital Status: Single    Spouse Name: N/A  . Number of Children: N/A  . Years of Education: N/A   Occupational History  . Psychologist, educational    Social History Main Topics  . Smoking status: Former Smoker    Quit date: 02/21/2001  . Smokeless tobacco: Never Used  . Alcohol Use: No  . Drug Use: No  . Sexual Activity: Not Currently   Other Topics Concern  . 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 Tonsillectomy    . Foot surgery    . Abdominal hysterectomy      Family History  Problem Relation Age of Onset  . Diabetes Mother   . Hypertension Mother   . Diabetes Sister   . Diabetes Brother   . Hypertension Brother     Review of Systems  Constitutional: Negative.   HENT: Negative.   Eyes: Negative.   Respiratory: Negative.   Cardiovascular: Negative.   Gastrointestinal: Negative.   Endocrine: Negative.   Genitourinary: Negative.   Musculoskeletal: Negative.   Skin: Negative.   Allergic/Immunologic: Negative.   Neurological: Negative.   Hematological: Negative.   Psychiatric/Behavioral: Negative.    Objective:  BP 140/90 mmHg  Pulse 74  Temp(Src) 98.5 F (36.9 C) (Oral)  Resp 16  Ht  (1.626 m)  Wt 206 lb (93.441 kg)  BMI 35.34 kg/m2  SpO2 98%  Physical Exam  Constitutional: She is oriented to person, place, and time and well-developed, well-nourished, and in no distress.  HENT:  Head: Normocephalic and atraumatic.  Right Ear: Hearing, tympanic membrane, external ear and ear canal normal.  Left  Ear: Hearing, tympanic membrane, external ear and ear canal normal.  Nose: Nose normal.  Mouth/Throat: Uvula is midline, oropharynx is clear and moist and mucous membranes are normal.  Eyes: Conjunctivae and EOM are normal. Pupils are equal, round, and reactive to light.  Neck: Trachea normal and normal range of motion. Neck supple. No thyroid mass and no thyromegaly present.  Cardiovascular: Normal rate, regular rhythm and normal heart sounds.   No murmur heard. Pulmonary/Chest: Effort normal and breath sounds normal. She has no wheezes. Right breast exhibits no inverted nipple, no mass, no nipple discharge, no skin change and no tenderness. Left breast exhibits no inverted nipple, no mass, no nipple discharge, no skin change and no tenderness. Breasts are symmetrical.    Abdominal: Soft. Bowel sounds are normal. There is no tenderness.  Musculoskeletal: Normal range of motion.  Lymphadenopathy:    She has no cervical adenopathy.  Neurological: She is alert and oriented to person, place, and time. She has normal motor skills, normal sensation, normal strength and normal reflexes. Gait normal.  Skin: Skin is warm and dry.  Psychiatric: Mood, memory, affect and judgment normal.    Visual Acuity Screening   Right eye Left eye Both eyes  Without correction:     With correction: 20/20    Comments: Pt states "she can not see out of the left eye, contact must have fallen out."   Assessment and Plan :  Annual physical exam - anticipatory guidance  Benign essential HTN - Plan: COMPLETE METABOLIC PANEL WITH GFR, Lipid panel, TSH, hydrochlorothiazide (HYDRODIURIL) 25 MG tablet - well controlled on current medications which she will continue  Vertigo - Plan: meclizine (ANTIVERT) 25 MG tablet - continue medication prn  Encounter for screening mammogram for breast cancer - Plan: MM Digital Screening - she will schedule   Colon cancer screening - Plan: Ambulatory referral to Gastroenterology - she will schedule  Screening for deficiency anemia - Plan: CBC with Differential/Platelet  Need for Tdap vaccination - Plan: Tdap vaccine greater than or equal to 7yo IM  Encounter for vitamin deficiency screening - Plan: VITAMIN D 25 Hydroxy (Vit-D Deficiency, Fractures)  Screening for HIV (human immunodeficiency virus) - Plan: HIV antibody  Need for hepatitis C screening test - Plan: Hepatitis C antibody  Obesity, Class II, BMI 35-39.9 - encourage lifestyle modifications or weight loss  Benny Lennert PA-C  [facility] 04/12/2015 4:10 PM

## 2015-04-12 NOTE — Patient Instructions (Signed)
Health Maintenance, Female Adopting a healthy lifestyle and getting preventive care can go a long way to promote health and wellness. Talk with your health care provider about what schedule of regular examinations is right for you. This is a good chance for you to check in with your provider about disease prevention and staying healthy. In between checkups, there are plenty of things you can do on your own. Experts have done a lot of research about which lifestyle changes and preventive measures are most likely to keep you healthy. Ask your health care provider for more information. WEIGHT AND DIET  Eat a healthy diet  Be sure to include plenty of vegetables, fruits, low-fat dairy products, and lean protein.  Do not eat a lot of foods high in solid fats, added sugars, or salt.  Get regular exercise. This is one of the most important things you can do for your health.  Most adults should exercise for at least 150 minutes each week. The exercise should increase your heart rate and make you sweat (moderate-intensity exercise).  Most adults should also do strengthening exercises at least twice a week. This is in addition to the moderate-intensity exercise.  Maintain a healthy weight  Body mass index (BMI) is a measurement that can be used to identify possible weight problems. It estimates body fat based on height and weight. Your health care provider can help determine your BMI and help you achieve or maintain a healthy weight.  For females 24 years of age and older:   A BMI below 18.5 is considered underweight.  A BMI of 18.5 to 24.9 is normal.  A BMI of 25 to 29.9 is considered overweight.  A BMI of 30 and above is considered obese.  Watch levels of cholesterol and blood lipids  You should start having your blood tested for lipids and cholesterol at 65 years of age, then have this test every 5 years.  You may need to have your cholesterol levels checked more often if:  Your lipid  or cholesterol levels are high.  You are older than 65 years of age.  You are at high risk for heart disease.  CANCER SCREENING   Lung Cancer  Lung cancer screening is recommended for adults 50-77 years old who are at high risk for lung cancer because of a history of smoking.  A yearly low-dose CT scan of the lungs is recommended for people who:  Currently smoke.  Have quit within the past 15 years.  Have at least a 30-pack-year history of smoking. A pack year is smoking an average of one pack of cigarettes a day for 1 year.  Yearly screening should continue until it has been 15 years since you quit.  Yearly screening should stop if you develop a health problem that would prevent you from having lung cancer treatment.  Breast Cancer  Practice breast self-awareness. This means understanding how your breasts normally appear and feel.  It also means doing regular breast self-exams. Let your health care provider know about any changes, no matter how small.  If you are in your 20s or 30s, you should have a clinical breast exam (CBE) by a health care provider every 1-3 years as part of a regular health exam.  If you are 29 or older, have a CBE every year. Also consider having a breast X-ray (mammogram) every year.  If you have a family history of breast cancer, talk to your health care provider about genetic screening.  If you  are at high risk for breast cancer, talk to your health care provider about having an MRI and a mammogram every year.  Breast cancer gene (BRCA) assessment is recommended for women who have family members with BRCA-related cancers. BRCA-related cancers include:  Breast.  Ovarian.  Tubal.  Peritoneal cancers.  Results of the assessment will determine the need for genetic counseling and BRCA1 and BRCA2 testing. Cervical Cancer Your health care provider may recommend that you be screened regularly for cancer of the pelvic organs (ovaries, uterus, and  vagina). This screening involves a pelvic examination, including checking for microscopic changes to the surface of your cervix (Pap test). You may be encouraged to have this screening done every 3 years, beginning at age 21.  For women ages 30-65, health care providers may recommend pelvic exams and Pap testing every 3 years, or they may recommend the Pap and pelvic exam, combined with testing for human papilloma virus (HPV), every 5 years. Some types of HPV increase your risk of cervical cancer. Testing for HPV may also be done on women of any age with unclear Pap test results.  Other health care providers may not recommend any screening for nonpregnant women who are considered low risk for pelvic cancer and who do not have symptoms. Ask your health care provider if a screening pelvic exam is right for you.  If you have had past treatment for cervical cancer or a condition that could lead to cancer, you need Pap tests and screening for cancer for at least 20 years after your treatment. If Pap tests have been discontinued, your risk factors (such as having a new sexual partner) need to be reassessed to determine if screening should resume. Some women have medical problems that increase the chance of getting cervical cancer. In these cases, your health care provider may recommend more frequent screening and Pap tests. Colorectal Cancer  This type of cancer can be detected and often prevented.  Routine colorectal cancer screening usually begins at 65 years of age and continues through 65 years of age.  Your health care provider may recommend screening at an earlier age if you have risk factors for colon cancer.  Your health care provider may also recommend using home test kits to check for hidden blood in the stool.  A small camera at the end of a tube can be used to examine your colon directly (sigmoidoscopy or colonoscopy). This is done to check for the earliest forms of colorectal  cancer.  Routine screening usually begins at age 50.  Direct examination of the colon should be repeated every 5-10 years through 65 years of age. However, you may need to be screened more often if early forms of precancerous polyps or small growths are found. Skin Cancer  Check your skin from head to toe regularly.  Tell your health care provider about any new moles or changes in moles, especially if there is a change in a mole's shape or color.  Also tell your health care provider if you have a mole that is larger than the size of a pencil eraser.  Always use sunscreen. Apply sunscreen liberally and repeatedly throughout the day.  Protect yourself by wearing long sleeves, pants, a wide-brimmed hat, and sunglasses whenever you are outside. HEART DISEASE, DIABETES, AND HIGH BLOOD PRESSURE   High blood pressure causes heart disease and increases the risk of stroke. High blood pressure is more likely to develop in:  People who have blood pressure in the high end   of the normal range (130-139/85-89 mm Hg).  People who are overweight or obese.  People who are African American.  If you are 38-23 years of age, have your blood pressure checked every 3-5 years. If you are 61 years of age or older, have your blood pressure checked every year. You should have your blood pressure measured twice--once when you are at a hospital or clinic, and once when you are not at a hospital or clinic. Record the average of the two measurements. To check your blood pressure when you are not at a hospital or clinic, you can use:  An automated blood pressure machine at a pharmacy.  A home blood pressure monitor.  If you are between 45 years and 39 years old, ask your health care provider if you should take aspirin to prevent strokes.  Have regular diabetes screenings. This involves taking a blood sample to check your fasting blood sugar level.  If you are at a normal weight and have a low risk for diabetes,  have this test once every three years after 65 years of age.  If you are overweight and have a high risk for diabetes, consider being tested at a younger age or more often. PREVENTING INFECTION  Hepatitis B  If you have a higher risk for hepatitis B, you should be screened for this virus. You are considered at high risk for hepatitis B if:  You were born in a country where hepatitis B is common. Ask your health care provider which countries are considered high risk.  Your parents were born in a high-risk country, and you have not been immunized against hepatitis B (hepatitis B vaccine).  You have HIV or AIDS.  You use needles to inject street drugs.  You live with someone who has hepatitis B.  You have had sex with someone who has hepatitis B.  You get hemodialysis treatment.  You take certain medicines for conditions, including cancer, organ transplantation, and autoimmune conditions. Hepatitis C  Blood testing is recommended for:  Everyone born from [birth year] through 1965.  Anyone with known risk factors for hepatitis C. Sexually transmitted infections (STIs)  You should be screened for sexually transmitted infections (STIs) including gonorrhea and chlamydia if:  You are sexually active and are younger than 65 years of age.  You are older than 65 years of age and your health care provider tells you that you are at risk for this type of infection.  Your sexual activity has changed since you were last screened and you are at an increased risk for chlamydia or gonorrhea. Ask your health care provider if you are at risk.  If you do not have HIV, but are at risk, it may be recommended that you take a prescription medicine daily to prevent HIV infection. This is called pre-exposure prophylaxis (PrEP). You are considered at risk if:  You are sexually active and do not regularly use condoms or know the HIV status of your partner(s).  You take drugs by injection.  You are sexually  active with a partner who has HIV. Talk with your health care provider about whether you are at high risk of being infected with HIV. If you choose to begin PrEP, you should first be tested for HIV. You should then be tested every 3 months for as long as you are taking PrEP.  PREGNANCY   If you are premenopausal and you may become pregnant, ask your health care provider about preconception counseling.  If you may  become pregnant, take 400 to 800 micrograms (mcg) of folic acid every day.  If you want to prevent pregnancy, talk to your health care provider about birth control (contraception). OSTEOPOROSIS AND MENOPAUSE   Osteoporosis is a disease in which the bones lose minerals and strength with aging. This can result in serious bone fractures. Your risk for osteoporosis can be identified using a bone density scan.  If you are 61 years of age or older, or if you are at risk for osteoporosis and fractures, ask your health care provider if you should be screened.  Ask your health care provider whether you should take a calcium or vitamin D supplement to lower your risk for osteoporosis.  Menopause may have certain physical symptoms and risks.  Hormone replacement therapy may reduce some of these symptoms and risks. Talk to your health care provider about whether hormone replacement therapy is right for you.  HOME CARE INSTRUCTIONS   Schedule regular health, dental, and eye exams.  Stay current with your immunizations.   Do not use any tobacco products including cigarettes, chewing tobacco, or electronic cigarettes.  If you are pregnant, do not drink alcohol.  If you are breastfeeding, limit how much and how often you drink alcohol.  Limit alcohol intake to no more than 1 drink per day for nonpregnant women. One drink equals 12 ounces of beer, 5 ounces of wine, or 1 ounces of hard liquor.  Do not use street drugs.  Do not share needles.  Ask your health care provider for help if  you need support or information about quitting drugs.  Tell your health care provider if you often feel depressed.  Tell your health care provider if you have ever been abused or do not feel safe at home.   This information is not intended to replace advice given to you by your health care provider. Make sure you discuss any questions you have with your health care provider.   Document Released: 09/23/2010 Document Revised: 03/31/2014 Document Reviewed: 02/09/2013 Elsevier Interactive Patient Education [employer].

## 2015-04-13 ENCOUNTER — Other Ambulatory Visit: Payer: Self-pay | Admitting: Physician Assistant

## 2015-04-13 DIAGNOSIS — E7800 Pure hypercholesterolemia, unspecified: Secondary | ICD-10-CM | POA: Insufficient documentation

## 2015-04-13 LAB — HIV ANTIBODY (ROUTINE TESTING W REFLEX): HIV: NONREACTIVE

## 2015-04-13 LAB — HEPATITIS C ANTIBODY: HCV AB: NEGATIVE

## 2015-04-13 LAB — TSH: TSH: 1.477 u[IU]/mL (ref 0.350–4.500)

## 2015-04-13 LAB — VITAMIN D 25 HYDROXY (VIT D DEFICIENCY, FRACTURES): Vit D, 25-Hydroxy: 9 ng/mL — ABNORMAL LOW (ref 30–100)

## 2015-04-13 MED ORDER — ATORVASTATIN CALCIUM 20 MG PO TABS: 20.0000 mg | ORAL_TABLET | Freq: Every day | ORAL | Status: DC

## 2015-04-17 NOTE — Addendum Note (Signed)
Addended by: Morrell Riddle on: 04/17/2015 08:29 AM   Modules accepted: Kipp Brood

## 2015-04-18 ENCOUNTER — Other Ambulatory Visit: Payer: Self-pay

## 2015-04-18 DIAGNOSIS — Z1231 Encounter for screening mammogram for malignant neoplasm of breast: Secondary | ICD-10-CM

## 2015-05-02 ENCOUNTER — Ambulatory Visit: Payer: Self-pay

## 2015-05-29 ENCOUNTER — Ambulatory Visit
Admission: RE | Admit: 2015-05-29 | Discharge: 2015-05-29 | Disposition: A | Discharge status: Home / Self Care | Payer: Medicare HMO | Source: Ambulatory Visit | Attending: Physician Assistant | Admitting: Physician Assistant

## 2015-05-29 DIAGNOSIS — Z1231 Encounter for screening mammogram for malignant neoplasm of breast: Secondary | ICD-10-CM

## 2015-05-31 ENCOUNTER — Encounter: Payer: Self-pay | Admitting: Physician Assistant

## 2015-05-31 ENCOUNTER — Ambulatory Visit (INDEPENDENT_AMBULATORY_CARE_PROVIDER_SITE_OTHER): Payer: Medicare HMO | Admitting: Physician Assistant

## 2015-05-31 VITALS — BP 127/79 | HR 87 | Temp 98.1°F | Resp 16 | Ht 64.0 in | Wt 206.0 lb

## 2015-05-31 DIAGNOSIS — I10 Essential (primary) hypertension: Secondary | ICD-10-CM | POA: Diagnosis not present

## 2015-05-31 DIAGNOSIS — E669 Obesity, unspecified: Secondary | ICD-10-CM

## 2015-05-31 DIAGNOSIS — E7800 Pure hypercholesterolemia, unspecified: Secondary | ICD-10-CM

## 2015-05-31 NOTE — Patient Instructions (Signed)
Fish oil Red yeast rice --

## 2015-05-31 NOTE — Progress Notes (Signed)
   Wanda PoseyMary L Scotto  MRN: [id number] DOB: 02/04/1951  Subjective:  Pt presents to clinic for a recheck. She decided not to do the lipitor after she read the side effects.  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  For the 1st time in months she is really happy with where she is and he feels like other things will start to fall into place.  Patient Active Problem List   Diagnosis Date Noted  . Elevated cholesterol 04/13/2015  . Obesity, Class II, BMI 35-39.9 04/12/2015  . Vertigo 06/15/2014  . Benign essential HTN 05/07/2014    Current Outpatient Prescriptions on File Prior to Visit  Medication Sig Dispense Refill  . hydrochlorothiazide (HYDRODIURIL) 25 MG tablet Take 1 tablet (25 mg total) by mouth daily. 90 tablet 1  . meclizine (ANTIVERT) 25 MG tablet Take 1 tablet (25 mg total) by mouth 3 (three) times daily as needed for dizziness. 90 tablet 0   No current facility-administered medications on file prior to visit.    Allergies  Allergen Reactions  . Red Dye Shortness Of Breath  . Amlodipine Swelling    Bilateral LE swelling with medication.     Review of Systems  Constitutional: Negative for fever and chills.  Respiratory: Negative for shortness of breath.   Cardiovascular: Negative for chest pain, palpitations and leg swelling.   Objective:  BP 127/79 mmHg  Pulse 87  Temp(Src) 98.1 F (36.7 C)  Resp 16  Ht 5\' 4"  (1.626 m)  Wt 206 lb (93.441 kg)  BMI 35.34 kg/m2  Physical Exam  Constitutional: She is oriented to person, place, and time and well-developed, well-nourished, and in no distress.  HENT:  Head: Normocephalic and atraumatic.  Right Ear: Hearing and external ear normal.  Left Ear: Hearing and external ear normal.  Eyes: Conjunctivae are normal.  Neck: Normal range of motion.  Pulmonary/Chest: Effort normal.  Neurological: She is alert and oriented to person, place,  and time. Gait normal.  Skin: Skin is warm and dry.  Psychiatric: Mood, memory, affect and judgment normal.  Vitals reviewed.   Assessment and Plan :  Benign essential HTN- well controlled - she will continue her medication  Obesity, Class II, BMI 35-39.9  Elevated cholesterol - pt wants to try diet and exercise and other changes - and we will recheck in 6 months.  Benny LennertSarah Lorriann Hansmann PA-C  [facility] 05/31/2015 4:38 PM

## 2015-06-01 ENCOUNTER — Encounter: Payer: Self-pay | Admitting: Physician Assistant

## 2015-07-25 DIAGNOSIS — M1711 Unilateral primary osteoarthritis, right knee: Secondary | ICD-10-CM | POA: Diagnosis not present

## 2015-08-10 ENCOUNTER — Ambulatory Visit (INDEPENDENT_AMBULATORY_CARE_PROVIDER_SITE_OTHER): Payer: Medicare Other | Admitting: Physician Assistant

## 2015-08-10 ENCOUNTER — Encounter: Payer: Self-pay | Admitting: Physician Assistant

## 2015-08-10 DIAGNOSIS — K429 Umbilical hernia without obstruction or gangrene: Secondary | ICD-10-CM | POA: Diagnosis not present

## 2015-08-10 NOTE — Progress Notes (Signed)
Patient ID: Wanda PoseyMary L Kush, female    DOB: 04/11/1950, 65 y.o.   MRN: [id number]  PCP: Virgilio BellingWEBER,SARAH, PA-C  Subjective:   Chief Complaint  Patient presents with  . Mass    abdomen    HPI Presents for evaluation of a lump on her abdomen.  First noticed this last night when she returned from the gym. Put her hand there ("right at my navel") because it was feeling tight. "If it wasn't for the [employer], I wouldn't even know it was there." No pain.   Review of Systems  Constitutional: Negative for fever, chills, diaphoresis and fatigue.  Gastrointestinal: Negative for nausea, vomiting, abdominal pain, diarrhea, constipation, blood in stool, abdominal distention, anal bleeding and rectal pain.  Genitourinary: Negative for dysuria, urgency, frequency and hematuria.       Patient Active Problem List   Diagnosis Date Noted  . Elevated cholesterol 04/13/2015  . Obesity, Class II, BMI 35-39.9 04/12/2015  . Vertigo 06/15/2014  . Benign essential HTN 05/07/2014     Prior to Admission medications   Medication Sig Start Date End Date Taking? Authorizing Provider  hydrochlorothiazide (HYDRODIURIL) 25 MG tablet Take 1 tablet (25 mg total) by mouth daily. 04/12/15  Yes Morrell RiddleSarah L Weber, PA-C  meclizine (ANTIVERT) 25 MG tablet Take 1 tablet (25 mg total) by mouth 3 (three) times daily as needed for dizziness. Patient not taking: Reported on 08/10/2015 04/12/15   Morrell RiddleSarah L Weber, PA-C     Allergies  Allergen Reactions  . Red Dye Shortness Of Breath  . Amlodipine Swelling    Bilateral LE swelling with medication.        Objective:  Physical Exam  Constitutional: She is oriented to person, place, and time. She appears well-developed and well-nourished. She is active and cooperative. No distress.  There were no vitals taken for this visit.  HENT:  Head: Normocephalic and atraumatic.  Right Ear: Hearing normal.  Left Ear: Hearing normal.  Eyes: Conjunctivae are normal. No scleral  icterus.  Neck: Normal range of motion. Neck supple. No thyromegaly present.  Cardiovascular: Normal rate, regular rhythm and normal heart sounds.   Pulses:      Radial pulses are 2+ on the right side, and 2+ on the left side.  Pulmonary/Chest: Effort normal and breath sounds normal.  Abdominal: Normal appearance and bowel sounds are normal. She exhibits no shifting dullness, no distension, no pulsatile liver, no fluid wave, no abdominal bruit, no ascites, no pulsatile midline mass and no mass. There is no hepatosplenomegaly. There is no tenderness. A hernia is present. Hernia confirmed positive in the ventral area.    Lymphadenopathy:       Head (right side): No tonsillar, no preauricular, no posterior auricular and no occipital adenopathy present.       Head (left side): No tonsillar, no preauricular, no posterior auricular and no occipital adenopathy present.    She has no cervical adenopathy.       Right: No supraclavicular adenopathy present.       Left: No supraclavicular adenopathy present.  Neurological: She is alert and oriented to person, place, and time. No sensory deficit.  Skin: Skin is warm, dry and intact. No rash noted. No cyanosis or erythema. Nails show no clubbing.  Psychiatric: She has a normal mood and affect. Her speech is normal and behavior is normal.           Assessment & Plan:   1. Umbilical hernia without obstruction and without gangrene  As this is not at all tender, she can observe it for now. Anticipatory guidance provided. If she develops pain, or if she just decides she's ready, she'll let us know and we will proceed with referral to [facility]. Otherwise she will follow-up with Ms. Weber as planned in 10/2015.   Fernande Bras, PA-C Physician Assistant-Certified [facility]

## 2015-08-10 NOTE — Patient Instructions (Signed)
If you have pain, back off on the exercises and let us know. Otherwise, follow-up with Maralyn SagoSarah in August.    IF you received an x-ray today, you will receive an invoice from [facility]. Please contact [facility] at [phone] with questions or concerns regarding your invoice.   IF you received labwork today, you will receive an invoice from [facility]. Please contact [facility] at [phone] with questions or concerns regarding your invoice.   Our billing staff will not be able to assist you with questions regarding bills from these companies.  You will be contacted with the lab results as soon as they are available. The fastest way to get your results is to activate your My Chart account. Instructions are located on the last page of this paperwork. If you have not heard from us regarding the results in 2 weeks, please contact this office.

## 2015-08-28 ENCOUNTER — Encounter: Payer: Self-pay | Admitting: Physician Assistant

## 2015-08-28 ENCOUNTER — Telehealth: Payer: Self-pay

## 2015-08-28 DIAGNOSIS — I10 Essential (primary) hypertension: Secondary | ICD-10-CM

## 2015-08-28 MED ORDER — HYDROCHLOROTHIAZIDE 25 MG PO TABS: 25.0000 mg | ORAL_TABLET | Freq: Every day | ORAL | Status: DC

## 2015-08-28 NOTE — Telephone Encounter (Signed)
Patient is calling to request a refill for hydrochlorothiazide. She states she sent an email. [facility] [address] [phone]

## 2015-08-30 NOTE — Telephone Encounter (Signed)
Wanda Newton already responded to email and sent Rf.

## 2015-09-16 ENCOUNTER — Encounter: Payer: Self-pay | Admitting: Physician Assistant

## 2015-11-17 ENCOUNTER — Encounter: Payer: Medicare Other | Admitting: Physician Assistant

## 2015-11-17 NOTE — Progress Notes (Signed)
This encounter was created in error - please disregard.

## 2015-11-17 NOTE — Patient Instructions (Signed)
     IF you received an x-ray today, you will receive an invoice from [facility]. Please contact [facility] at [phone] with questions or concerns regarding your invoice.   IF you received labwork today, you will receive an invoice from [facility]. Please contact [facility] at [phone] with questions or concerns regarding your invoice.   Our billing staff will not be able to assist you with questions regarding bills from these companies.  You will be contacted with the lab results as soon as they are available. The fastest way to get your results is to activate your My Chart account. Instructions are located on the last page of this paperwork. If you have not heard from us regarding the results in 2 weeks, please contact this office.      

## 2015-11-30 ENCOUNTER — Encounter: Payer: Self-pay | Admitting: Physician Assistant

## 2015-11-30 ENCOUNTER — Ambulatory Visit (INDEPENDENT_AMBULATORY_CARE_PROVIDER_SITE_OTHER): Payer: Medicare Other | Admitting: Physician Assistant

## 2015-11-30 VITALS — BP 130/80 | HR 95 | Temp 98.8°F | Resp 17 | Ht 65.0 in | Wt 213.0 lb

## 2015-11-30 DIAGNOSIS — I10 Essential (primary) hypertension: Secondary | ICD-10-CM | POA: Diagnosis not present

## 2015-11-30 DIAGNOSIS — R109 Unspecified abdominal pain: Secondary | ICD-10-CM | POA: Diagnosis not present

## 2015-11-30 DIAGNOSIS — E7800 Pure hypercholesterolemia, unspecified: Secondary | ICD-10-CM | POA: Diagnosis not present

## 2015-11-30 LAB — POC MICROSCOPIC URINALYSIS (UMFC): Mucus: ABSENT

## 2015-11-30 LAB — LIPID PANEL
CHOLESTEROL: 313 mg/dL — AB (ref 125–200)
HDL: 80 mg/dL (ref >=46)
LDL Cholesterol: 208 mg/dL — ABNORMAL HIGH (ref <130)
Total CHOL/HDL Ratio: 3.9 Ratio (ref <=5.0)
Triglycerides: 126 mg/dL (ref <150)
VLDL: 25 mg/dL (ref <30)

## 2015-11-30 LAB — POCT URINALYSIS DIP (MANUAL ENTRY)
BILIRUBIN UA: NEGATIVE
BILIRUBIN UA: NEGATIVE
Blood, UA: NEGATIVE
Glucose, UA: NEGATIVE
LEUKOCYTES UA: NEGATIVE
Nitrite, UA: NEGATIVE
PROTEIN UA: NEGATIVE
Urobilinogen, UA: 0.2
pH, UA: 5.5

## 2015-11-30 LAB — COMPLETE METABOLIC PANEL WITH GFR
ALBUMIN: 4.2 g/dL (ref 3.6–5.1)
ALK PHOS: 58 U/L (ref 33–130)
ALT: 20 U/L (ref 6–29)
AST: 22 U/L (ref 10–35)
BUN: 19 mg/dL (ref 7–25)
CALCIUM: 9.8 mg/dL (ref 8.6–10.4)
CO2: 25 mmol/L (ref 20–31)
CREATININE: 0.81 mg/dL (ref 0.50–0.99)
Chloride: 102 mmol/L (ref 98–110)
GFR, Est African American: 88 mL/min (ref >=60)
GFR, Est Non African American: 76 mL/min (ref >=60)
GLUCOSE: 125 mg/dL — AB (ref 65–99)
Potassium: 3.1 mmol/L — ABNORMAL LOW (ref 3.5–5.3)
SODIUM: 138 mmol/L (ref 135–146)
Total Bilirubin: 0.5 mg/dL (ref 0.2–1.2)
Total Protein: 7.6 g/dL (ref 6.1–8.1)

## 2015-11-30 MED ORDER — HYDROCHLOROTHIAZIDE 25 MG PO TABS: 50.0000 mg | ORAL_TABLET | Freq: Every day | ORAL | 1 refills | Status: DC

## 2015-11-30 NOTE — Progress Notes (Signed)
Wanda Newton  MRN: [id number] DOB: [birth year]  Subjective:  Pt presents to clinic with left side flank pain that started last night with the chills and she is worried that she might have a kidney infection.  She is having no urinary symptoms.  The pain does seem to be worse when she is moving and during physical activity.  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  She is right handed.  She is working out - it has decreased since since she started back at work due to time.  Red yeast rice, calcium (600) and Vit D (200) daily since her last lab work.  She checks her BP at home and it seems to be better when she takes HCTZ 50mg  which she has been doing for the last week or so.  Review of Systems  Genitourinary: Negative for dysuria, hematuria and urgency.  Musculoskeletal: Positive for back pain.    Patient Active Problem List   Diagnosis Date Noted  . Umbilical hernia 08/10/2015  . Elevated cholesterol 04/13/2015  . Obesity, Class II, BMI 35-39.9 04/12/2015  . Vertigo 06/15/2014  . Benign essential HTN 05/07/2014    Current Outpatient Prescriptions on File Prior to Visit  Medication Sig Dispense Refill  . meclizine (ANTIVERT) 25 MG tablet Take 1 tablet (25 mg total) by mouth 3 (three) times daily as needed for dizziness. 90 tablet 0   No current facility-administered medications on file prior to visit.     Allergies  Allergen Reactions  . Red Dye Shortness Of Breath  . Amlodipine Swelling    Bilateral LE swelling with medication.     Pt patients past, family and social history were reviewed and updated.  Objective:  BP 130/80 (BP Location: Right Arm, Patient Position: Sitting, Cuff Size: Normal)   Pulse 95   Temp 98.8 F (37.1 C)  (Oral)   Resp 17   Ht 5\' 5"  (1.651 m)   Wt 213 lb (96.6 kg)   SpO2 96%   BMI 35.45 kg/m   Physical Exam  Constitutional: She is oriented to person, place, and time and well-developed, well-nourished, and in no distress.  HENT:  Head: Normocephalic and atraumatic.  Right Ear: Hearing and external ear normal.  Left Ear: Hearing and external ear normal.  Eyes: Conjunctivae are normal.  Neck: Normal range of motion.  Cardiovascular: Normal rate, regular rhythm and normal heart sounds.   No murmur heard. Pulmonary/Chest: Effort normal and breath sounds normal.  Abdominal: Soft. There is no CVA tenderness.  Musculoskeletal:       Lumbar back: Normal.       Right lower leg: She exhibits no edema.       Left lower leg: She exhibits no edema.  Neurological: She is alert and oriented to person, place, and time. She has normal sensation, normal strength and normal reflexes. Gait normal.  Skin: Skin is warm and dry.  Psychiatric: Mood, memory, affect and judgment normal.  Vitals reviewed.  Results for orders placed or performed in visit on 11/30/15  COMPLETE METABOLIC PANEL WITH GFR  Result Value Ref Range   Sodium 138 135 - 146 mmol/L   Potassium 3.1 (L) 3.5 - 5.3 mmol/L   Chloride 102  98 - 110 mmol/L   CO2 25 20 - 31 mmol/L   Glucose, Bld 125 (H) 65 - 99 mg/dL   BUN 19 7 - 25 mg/dL   Creat 0.450.81 4.090.50 - 8.110.99 mg/dL   Total Bilirubin 0.5 0.2 - 1.2 mg/dL   Alkaline Phosphatase 58 33 - 130 U/L   AST 22 10 - 35 U/L   ALT 20 6 - 29 U/L   Total Protein 7.6 6.1 - 8.1 g/dL   Albumin 4.2 3.6 - 5.1 g/dL   Calcium 9.8 8.6 - 91.410.4 mg/dL   GFR, Est African American 88 >=60 mL/min   GFR, Est Non African American 76 >=60 mL/min  Lipid panel  Result Value Ref Range   Cholesterol 313 (H) 125 - 200 mg/dL   Triglycerides 782126 <956<150 mg/dL   HDL 80 >=21>=46 mg/dL   Total CHOL/HDL Ratio 3.9 <=5.0 Ratio   VLDL 25 <30 mg/dL   LDL Cholesterol 308208 (H) <130 mg/dL  POCT Microscopic Urinalysis ([facility])  Result  Value Ref Range   WBC,UR,HPF,POC None None WBC/hpf   RBC,UR,HPF,POC None None RBC/hpf   Bacteria None None, Too numerous to count   Mucus Absent Absent   Epithelial Cells, UR Per Microscopy None None, Too numerous to count cells/hpf  POCT urinalysis dipstick  Result Value Ref Range   Color, UA yellow yellow   Clarity, UA clear clear   Glucose, UA negative negative   Bilirubin, UA negative negative   Ketones, POC UA negative negative   Spec Grav, UA >=1.030    Blood, UA negative negative   pH, UA 5.5    Protein Ur, POC negative negative   Urobilinogen, UA 0.2    Nitrite, UA Negative Negative   Leukocytes, UA Negative Negative    Assessment and Plan :  Elevated cholesterol - Plan: Lipid panel - check labs  Benign essential HTN - Plan: COMPLETE METABOLIC PANEL WITH GFR, hydrochlorothiazide (HYDRODIURIL) 25 MG tablet - well controlled -- continue current dosing of medications  Left flank pain - Plan: POCT Microscopic Urinalysis ([facility]), POCT urinalysis dipstick - expect her back pain to be related to change in physical activity. She will use motrin and or tylenol to help with the pain and she will stretch.    Benny LennertSarah Maytal Mijangos PA-C  [facility] 11/30/2015 2:22 PM

## 2015-11-30 NOTE — Patient Instructions (Addendum)
Red yeast rice - continue once a day unless I tell you otherwise Ok to increase HCTZ to 50mg  a day to help with you BP Calcium 600mg  take 1 pill 2x/day Vit D take 1 pill a day   IF you received an x-ray today, you will receive an invoice from [facility]. Please contact [facility] at [phone] with questions or concerns regarding your invoice.   IF you received labwork today, you will receive an invoice from [facility]. Please contact [facility] at [phone] with questions or concerns regarding your invoice.   Our billing staff will not be able to assist you with questions regarding bills from these companies.  You will be contacted with the lab results as soon as they are available. The fastest way to get your results is to activate your My Chart account. Instructions are located on the last page of this paperwork. If you have not heard from us regarding the results in 2 weeks, please contact this office.

## 2015-12-06 ENCOUNTER — Encounter: Payer: Self-pay | Admitting: Physician Assistant

## 2015-12-10 DIAGNOSIS — M1711 Unilateral primary osteoarthritis, right knee: Secondary | ICD-10-CM | POA: Diagnosis not present

## 2015-12-11 ENCOUNTER — Other Ambulatory Visit: Payer: Self-pay | Admitting: Physician Assistant

## 2015-12-11 DIAGNOSIS — I10 Essential (primary) hypertension: Secondary | ICD-10-CM

## 2015-12-11 MED ORDER — POTASSIUM CHLORIDE ER 10 MEQ PO TBCR: 10.0000 meq | EXTENDED_RELEASE_TABLET | Freq: Every day | ORAL | 0 refills | Status: DC

## 2015-12-11 MED ORDER — HYDROCHLOROTHIAZIDE 50 MG PO TABS: 50.0000 mg | ORAL_TABLET | Freq: Every day | ORAL | 0 refills | Status: DC

## 2016-01-19 ENCOUNTER — Other Ambulatory Visit: Payer: Self-pay | Admitting: Physician Assistant

## 2016-01-30 DIAGNOSIS — M1711 Unilateral primary osteoarthritis, right knee: Secondary | ICD-10-CM | POA: Diagnosis not present

## 2016-01-30 DIAGNOSIS — G8929 Other chronic pain: Secondary | ICD-10-CM | POA: Diagnosis not present

## 2016-01-30 DIAGNOSIS — M25561 Pain in right knee: Secondary | ICD-10-CM | POA: Diagnosis not present

## 2016-01-31 ENCOUNTER — Ambulatory Visit (INDEPENDENT_AMBULATORY_CARE_PROVIDER_SITE_OTHER): Payer: Medicare Other | Admitting: Physician Assistant

## 2016-01-31 VITALS — BP 118/76 | HR 83 | Temp 98.0°F | Resp 16 | Wt 217.6 lb

## 2016-01-31 DIAGNOSIS — E876 Hypokalemia: Secondary | ICD-10-CM | POA: Diagnosis not present

## 2016-01-31 DIAGNOSIS — Z01810 Encounter for preprocedural cardiovascular examination: Secondary | ICD-10-CM | POA: Diagnosis not present

## 2016-01-31 DIAGNOSIS — M1711 Unilateral primary osteoarthritis, right knee: Secondary | ICD-10-CM | POA: Diagnosis not present

## 2016-01-31 LAB — CBC WITH DIFFERENTIAL/PLATELET
BASOS PCT: 0 %
Basophils Absolute: 0 cells/uL (ref 0–200)
EOS ABS: 195 {cells}/uL (ref 15–500)
Eosinophils Relative: 3 %
HCT: 37.9 % (ref 35.0–45.0)
Hemoglobin: 12.5 g/dL (ref 11.7–15.5)
Lymphocytes Relative: 28 %
Lymphs Abs: 1820 cells/uL (ref 850–3900)
MCH: 29.1 pg (ref 27.0–33.0)
MCHC: 33 g/dL (ref 32.0–36.0)
MCV: 88.1 fL (ref 80.0–100.0)
MONO ABS: 390 {cells}/uL (ref 200–950)
MONOS PCT: 6 %
MPV: 11.1 fL (ref 7.5–12.5)
NEUTROS ABS: 4095 {cells}/uL (ref 1500–7800)
Neutrophils Relative %: 63 %
PLATELETS: 280 10*3/uL (ref 140–400)
RBC: 4.3 MIL/uL (ref 3.80–5.10)
RDW: 14.5 % (ref 11.0–15.0)
WBC: 6.5 10*3/uL (ref 3.8–10.8)

## 2016-01-31 NOTE — Patient Instructions (Addendum)
  So far it looks like you will be good for surgery - we are waiting to make sure your potassium is within normal range as it was low last time - once it is normal then surgery will be ok - I will call and get the form from the ortho office   IF you received an x-ray today, you will receive an invoice from [facility]. Please contact [facility] at [phone] with questions or concerns regarding your invoice.   IF you received labwork today, you will receive an invoice from [facility]. Please contact [facility] at [phone] with questions or concerns regarding your invoice.   Our billing staff will not be able to assist you with questions regarding bills from these companies.  You will be contacted with the lab results as soon as they are available. The fastest way to get your results is to activate your My Chart account. Instructions are located on the last page of this paperwork. If you have not heard from us regarding the results in 2 weeks, please contact this office.

## 2016-01-31 NOTE — Progress Notes (Signed)
Wanda PoseyMary L Mccoll  MRN: [id number] DOB: 04/10/1950  Subjective:  Pt presents to clinic for pre-surgical clearance.  She is doing really well.  She is taking her BP medication daily and her potassium supplement daily.  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  Past smoker -- quit in 2002 - full activity - the knee hinders her activity level  Planning on spinal block and not general anesthesia for her knee replacement.  Review of Systems  Respiratory: Negative for cough.   Cardiovascular: Negative for chest pain and leg swelling.  Musculoskeletal: Positive for gait problem (right knee pain).    Patient Active Problem List   Diagnosis Date Noted  . Umbilical hernia 08/10/2015  . Elevated cholesterol 04/13/2015  . Obesity, Class II, BMI 35-39.9 04/12/2015  . Vertigo 06/15/2014  . Benign essential HTN 05/07/2014    Current Outpatient Prescriptions on File Prior to Visit  Medication Sig Dispense Refill  . calcium carbonate (CALCIUM 600) 600 MG TABS tablet Take 600 mg by mouth 2 (two) times daily with a meal.    . Cholecalciferol (VITAMIN D) 2000 units CAPS Take 2,000 Units by mouth 2 (two) times daily.    . hydrochlorothiazide (HYDRODIURIL) 50 MG tablet TAKE ONE TABLET BY MOUTH ONCE DAILY 30 tablet 0  . meclizine (ANTIVERT) 25 MG tablet Take 1 tablet (25 mg total) by mouth 3 (three) times daily as needed for dizziness. 90 tablet 0  . potassium chloride (K-DUR) 10 MEQ tablet Take 1 tablet (10 mEq total) by mouth daily. 30 tablet 0  . Red Yeast Rice 600 MG TABS Take 600 mg by mouth daily.     No current facility-administered medications on file prior to visit.     Allergies  Allergen Reactions  . Red Dye Shortness Of Breath  . Amlodipine Swelling    Bilateral LE swelling with medication.     Pt patients past, family and social history were reviewed and updated.   Objective:  BP 118/76 (BP  Location: Right Arm, Cuff Size: Large)   Pulse 83   Temp 98 F (36.7 C) (Oral)   Resp 16   Wt 217 lb 9.6 oz (98.7 kg)   SpO2 96%   BMI 36.21 kg/m   Physical Exam  Constitutional: She is oriented to person, place, and time and well-developed, well-nourished, and in no distress.  HENT:  Head: Normocephalic and atraumatic.  Right Ear: Hearing and external ear normal.  Left Ear: Hearing and external ear normal.  Eyes: Conjunctivae are normal.  Neck: Normal range of motion.  Cardiovascular: Normal rate, regular rhythm and normal heart sounds.   No murmur heard. Pulmonary/Chest: Effort normal and breath sounds normal. She has no wheezes.  Musculoskeletal:       Right lower leg: She exhibits no edema.       Left lower leg: She exhibits no edema.  Neurological: She is alert and oriented to person, place, and time. Gait normal.  Skin: Skin is warm and dry.  Psychiatric: Mood, memory, affect and judgment normal.  Vitals reviewed.   EKG - low voltage - no acute change read with Dr Cleta Albertsaub Assessment and Plan :  Pre-operative cardiovascular examination - Plan: EKG 12-Lead, CBC with Differential/Platelet, COMPLETE METABOLIC PANEL WITH GFR  Arthritis of right knee   Check potassium as long as that is normal patient is at no increase risk for major  adverse cardiac events.  She will continue her HTN medications. If her potassium is still low we need to make a decision of change in BP medication or increase in potassium - we will determine that when the results come back.  Benny LennertSarah Weber PA-C  [facility] 01/31/2016 12:48 PM

## 2016-02-01 LAB — COMPLETE METABOLIC PANEL WITH GFR
ALT: 23 U/L (ref 6–29)
AST: 20 U/L (ref 10–35)
Albumin: 4 g/dL (ref 3.6–5.1)
Alkaline Phosphatase: 74 U/L (ref 33–130)
BILIRUBIN TOTAL: 0.4 mg/dL (ref 0.2–1.2)
BUN: 12 mg/dL (ref 7–25)
CO2: 23 mmol/L (ref 20–31)
CREATININE: 0.71 mg/dL (ref 0.50–0.99)
Calcium: 9.4 mg/dL (ref 8.6–10.4)
Chloride: 102 mmol/L (ref 98–110)
GFR, Est Non African American: >89 mL/min (ref >=60)
GLUCOSE: 108 mg/dL — AB (ref 65–99)
Potassium: 4 mmol/L (ref 3.5–5.3)
Sodium: 140 mmol/L (ref 135–146)
TOTAL PROTEIN: 7 g/dL (ref 6.1–8.1)

## 2016-03-06 ENCOUNTER — Telehealth: Payer: Self-pay

## 2016-03-06 NOTE — Telephone Encounter (Signed)
Surgical clearance faxed to [place] [facility]

## 2016-03-07 ENCOUNTER — Other Ambulatory Visit (HOSPITAL_COMMUNITY): Payer: Self-pay | Admitting: *Deleted

## 2016-03-07 NOTE — Progress Notes (Signed)
ekg 01-31-16 dr Valarie Conesweber on chart  medical clearance dr Valarie Conesweber 01-31-16 on chart

## 2016-03-07 NOTE — Patient Instructions (Addendum)
Wanda PoseyMary L Newton  03/07/2016   Your procedure is scheduled on: 03-18-16  [facility]  [facility] 3rd floor to  Short Stay Center at 1245 PM  Call this number if you have problems the morning of surgery [phone]   Remember: ONLY 1 PERSON MAY GO WITH YOU TO SHORT STAY TO GET  READY MORNING OF YOUR SURGERY.   Do not eat food :After Midnight, clear liquids from midnight until 945 am day of surgery, nothing by mouth after 945 am day of surgery.     Take these medicines the morning of surgery with A SIP OF WATER: none                               You may not have any metal on your body including hair pins and              piercings  Do not wear jewelry, make-up, lotions, powders or perfumes, deodorant             Do not wear nail polish.  Do not shave  48 hours prior to surgery.              Men may shave face and neck.   Do not bring valuables to the hospital. [facility] IS NOT             RESPONSIBLE   FOR VALUABLES.  Contacts, dentures or bridgework may not be worn into surgery.  Leave suitcase in the car. After surgery it may be brought to your room.                  Please read over the following fact sheets you were given: _____________________________________________________________________                CLEAR LIQUID DIET   Foods Allowed                                                                     Foods Excluded  Coffee and tea, regular and decaf                             liquids that you cannot  Plain Jell-O in any flavor                                             see through such as: Fruit ices (not with fruit pulp)                                     milk, soups, orange juice  Iced Popsicles                                    All solid food Carbonated  beverages, regular and diet                                    Cranberry, grape and apple juices Sports drinks like Gatorade Lightly seasoned  clear broth or consume(fat free) Sugar, honey syrup  Sample Menu Breakfast                                Lunch                                     Supper Cranberry juice                    Beef broth                            Chicken broth Jell-O                                     Grape juice                           Apple juice Coffee or tea                        Jell-O                                      Popsicle                                                Coffee or tea                        Coffee or tea  _____________________________________________________________________  [facility] - Preparing for [facility] Before surgery, you can play an important role.  Because skin is not sterile, your skin needs to be as free of germs as possible.  You can reduce the number of germs on your skin by washing with CHG (chlorahexidine gluconate) soap before surgery.  CHG is an antiseptic cleaner which kills germs and bonds with the skin to continue killing germs even after washing. Please DO NOT use if you have an allergy to CHG or antibacterial soaps.  If your skin becomes reddened/irritated stop using the CHG and inform your nurse when you arrive at Short Stay. Do not shave (including legs and underarms) for at least 48 hours prior to the first CHG shower.  You may shave your face/neck. Please follow these instructions carefully:  1.  Shower with CHG Soap the night before surgery and the  morning of Surgery.  2.  If you choose to wash your hair, wash your hair first as usual with your  normal  shampoo.  3.  After you shampoo, rinse your hair and body thoroughly to remove the  shampoo.  4.  Use CHG as you would any other liquid soap.  You can apply chg directly  to the skin and wash                       Gently with a scrungie or clean washcloth.  5.  Apply the CHG Soap to your body ONLY FROM THE NECK DOWN.   Do not use on face/ open                           Wound or  open sores. Avoid contact with eyes, ears mouth and genitals (private parts).                       Wash face,  Genitals (private parts) with your normal soap.             6.  Wash thoroughly, paying special attention to the area where your surgery  will be performed.  7.  Thoroughly rinse your body with warm water from the neck down.  8.  DO NOT shower/wash with your normal soap after using and rinsing off  the CHG Soap.                9.  Pat yourself dry with a clean towel.            10.  Wear clean pajamas.            11.  Place clean sheets on your bed the night of your first shower and do not  sleep with pets. Day of Surgery : Do not apply any lotions/deodorants the morning of surgery.  Please wear clean clothes to the hospital/surgery center.  FAILURE TO FOLLOW THESE INSTRUCTIONS MAY RESULT IN THE CANCELLATION OF YOUR SURGERY PATIENT SIGNATURE_________________________________  NURSE SIGNATURE__________________________________  ________________________________________________________________________   Adam Phenix  An incentive spirometer is a tool that can help keep your lungs clear and active. This tool measures how well you are filling your lungs with each breath. Taking long deep breaths may help reverse or decrease the chance of developing breathing (pulmonary) problems (especially infection) following:  A long period of time when you are unable to move or be active. BEFORE THE PROCEDURE   If the spirometer includes an indicator to show your best effort, your nurse or respiratory therapist will set it to a desired goal.  If possible, sit up straight or lean slightly forward. Try not to slouch.  Hold the incentive spirometer in an upright position. INSTRUCTIONS FOR USE  1. Sit on the edge of your bed if possible, or sit up as far as you can in bed or on a chair. 2. Hold the incentive spirometer in an upright position. 3. Breathe out normally. 4. Place the  mouthpiece in your mouth and seal your lips tightly around it. 5. Breathe in slowly and as deeply as possible, raising the piston or the ball toward the top of the column. 6. Hold your breath for 3-5 seconds or for as long as possible. Allow the piston or ball to fall to the bottom of the column. 7. Remove the mouthpiece from your mouth and breathe out normally. 8. Rest for a few seconds and repeat Steps 1 through 7 at least 10 times every 1-2 hours when you are awake. Take your time and take a few normal breaths between deep breaths. 9. The spirometer may include an indicator to  show your best effort. Use the indicator as a goal to work toward during each repetition. 10. After each set of 10 deep breaths, practice coughing to be sure your lungs are clear. If you have an incision (the cut made at the time of surgery), support your incision when coughing by placing a pillow or rolled up towels firmly against it. Once you are able to get out of bed, walk around indoors and cough well. You may stop using the incentive spirometer when instructed by your caregiver.  RISKS AND COMPLICATIONS  Take your time so you do not get dizzy or light-headed.  If you are in pain, you may need to take or ask for pain medication before doing incentive spirometry. It is harder to take a deep breath if you are having pain. AFTER USE  Rest and breathe slowly and easily.  It can be helpful to keep track of a log of your progress. Your caregiver can provide you with a simple table to help with this. If you are using the spirometer at home, follow these instructions: [place] IF:   You are having difficultly using the spirometer.  You have trouble using the spirometer as often as instructed.  Your pain medication is not giving enough relief while using the spirometer.  You develop fever of 100.5 F (38.1 C) or higher. SEEK IMMEDIATE MEDICAL CARE IF:   You cough up bloody sputum that had not been present  before.  You develop fever of 102 F (38.9 C) or greater.  You develop worsening pain at or near the incision site. MAKE SURE YOU:   Understand these instructions.  Will watch your condition.  Will get help right away if you are not doing well or get worse. Document Released: 07/21/2006 Document Revised: 06/02/2011 Document Reviewed: 09/21/2006 ExitCare Patient Information 2014 Exit[employer], [place].   ________________________________________________________________________  WHAT IS A BLOOD TRANSFUSION? Blood Transfusion Information  A transfusion is the replacement of blood or some of its parts. Blood is made up of multiple cells which provide different functions.  Red blood cells carry oxygen and are used for blood loss replacement.  White blood cells fight against infection.  Platelets control bleeding.  Plasma helps clot blood.  Other blood products are available for specialized needs, such as hemophilia or other clotting disorders. BEFORE THE TRANSFUSION  Who gives blood for transfusions?   Healthy volunteers who are fully evaluated to make sure their blood is safe. This is blood bank blood. Transfusion therapy is the safest it has ever been in the practice of medicine. Before blood is taken from a donor, a complete history is taken to make sure that person has no history of diseases nor engages in risky social behavior (examples are intravenous drug use or sexual activity with multiple partners). The donor's travel history is screened to minimize risk of transmitting infections, such as malaria. The donated blood is tested for signs of infectious diseases, such as HIV and hepatitis. The blood is then tested to be sure it is compatible with you in order to minimize the chance of a transfusion reaction. If you or a relative donates blood, this is often done in anticipation of surgery and is not appropriate for emergency situations. It takes many days to process the donated  blood. RISKS AND COMPLICATIONS Although transfusion therapy is very safe and saves many lives, the main dangers of transfusion include:   Getting an infectious disease.  Developing a transfusion reaction. This is an allergic reaction  to something in the blood you were given. Every precaution is taken to prevent this. The decision to have a blood transfusion has been considered carefully by your caregiver before blood is given. Blood is not given unless the benefits outweigh the risks. AFTER THE TRANSFUSION  Right after receiving a blood transfusion, you will usually feel much better and more energetic. This is especially true if your red blood cells have gotten low (anemic). The transfusion raises the level of the red blood cells which carry oxygen, and this usually causes an energy increase.  The nurse administering the transfusion will monitor you carefully for complications. HOME CARE INSTRUCTIONS  No special instructions are needed after a transfusion. You may find your energy is better. Speak with your caregiver about any limitations on activity for underlying diseases you may have. SEEK MEDICAL CARE IF:   Your condition is not improving after your transfusion.  You develop redness or irritation at the intravenous (IV) site. SEEK IMMEDIATE MEDICAL CARE IF:  Any of the following symptoms occur over the next 12 hours:  Shaking chills.  You have a temperature by mouth above 102 F (38.9 C), not controlled by medicine.  Chest, back, or muscle pain.  People around you feel you are not acting correctly or are confused.  Shortness of breath or difficulty breathing.  Dizziness and fainting.  You get a rash or develop hives.  You have a decrease in urine output.  Your urine turns a dark color or changes to pink, red, or brown. Any of the following symptoms occur over the next 10 days:  You have a temperature by mouth above 102 F (38.9 C), not controlled by  medicine.  Shortness of breath.  Weakness after normal activity.  The white part of the eye turns yellow (jaundice).  You have a decrease in the amount of urine or are urinating less often.  Your urine turns a dark color or changes to pink, red, or brown. Document Released: 03/07/2000 Document Revised: 06/02/2011 Document Reviewed: 10/25/2007 [facility] Patient Information 2014 [place], [place].  _______________________________________________________________________

## 2016-03-09 NOTE — H&P (Signed)
TOTAL KNEE ADMISSION H&P  Patient is being admitted for right total knee arthroplasty.  Subjective:  Chief Complaint:  Right knee primary OA / pain  HPI: Wanda PoseyMary L Newton, 65 y.o. female, has a history of pain and functional disability in the right knee due to arthritis and has failed non-surgical conservative treatments for greater than 12 weeks to includeNSAID's and/or analgesics, viscosupplementation injections, use of assistive devices and activity modification.  Onset of symptoms was gradual, starting 4-5 years ago with gradually worsening course since that time. The patient noted no past surgery on the right knee(s).  Patient currently rates pain in the right knee(s) at 9 out of 10 with activity. Patient has worsening of pain with activity and weight bearing, pain that interferes with activities of daily living and pain with passive range of motion.  Patient has evidence of periarticular osteophytes and joint space narrowing by imaging studies.  There is no active infection.   Risks, benefits and expectations were discussed with the patient.  Risks including but not limited to the risk of anesthesia, blood clots, nerve damage, blood vessel damage, failure of the prosthesis, infection and up to and including death.  Patient understand the risks, benefits and expectations and wishes to proceed with surgery.   PCP: Benny LennertWEBER,SARAH, PA-C  D/C Plans:      Home  Post-op Meds:       No Rx given  Tranexamic Acid:      To be given - IV   Decadron:      Is to be given  FYI:     ASA  Norco  Vistaril (allergic to red dye in Benadryl)    Patient Active Problem List   Diagnosis Date Noted  . Umbilical hernia 08/10/2015  . Elevated cholesterol 04/13/2015  . Obesity, Class II, BMI 35-39.9 04/12/2015  . Vertigo 06/15/2014  . Benign essential HTN 05/07/2014   Past Medical History:  Diagnosis Date  . Allergy   . Blood transfusion without reported diagnosis   . Diabetes mellitus without complication  (HCC)    "pre-diabetes"  . Hypertension   . Vertigo     Past Surgical History:  Procedure Laterality Date  . ABDOMINAL HYSTERECTOMY    . FOOT SURGERY    . TONSILLECTOMY      No prescriptions prior to admission.   Allergies  Allergen Reactions  . Red Dye Shortness Of Breath    Patient states this reaction ONLY occurred with BENADRYL TABLET  . Amlodipine Swelling    Bilateral LE swelling with medication.     Social History  Substance Use Topics  . Smoking status: Former Smoker    Quit date: 02/21/2001  . Smokeless tobacco: Never Used  . Alcohol use No    Family History  Problem Relation Age of Onset  . Diabetes Mother   . Hypertension Mother   . Diabetes Brother   . Hypertension Brother   . Diabetes Sister   . Mental illness Sister [id number]    schizophrenia  . Gout Brother      Review of Systems  Constitutional: Negative.   HENT: Positive for tinnitus.   Eyes: Negative.   Respiratory: Negative.   Cardiovascular: Positive for leg swelling.  Gastrointestinal: Negative.   Genitourinary: Negative.   Musculoskeletal: Positive for joint pain.  Skin: Negative.   Neurological: Negative.   Endo/Heme/Allergies: Positive for environmental allergies.  Psychiatric/Behavioral: Negative.     Objective:  Physical Exam  Constitutional: She is oriented to person, place, and time. She  appears well-developed.  HENT:  Head: Normocephalic.  Mouth/Throat: She has dentures.  Eyes: Pupils are equal, round, and reactive to light.  Neck: Neck supple. No JVD present. No tracheal deviation present. No thyromegaly present.  Cardiovascular: Normal rate, regular rhythm, normal heart sounds and intact distal pulses.   Respiratory: Effort normal and breath sounds normal. No respiratory distress. She has no wheezes.  GI: Soft. There is no tenderness. There is no guarding.  Musculoskeletal:       Right knee: She exhibits decreased range of motion, swelling and bony tenderness. She exhibits no  ecchymosis, no deformity, no laceration and no erythema. Tenderness found.  Lymphadenopathy:    She has no cervical adenopathy.  Neurological: She is alert and oriented to person, place, and time.  Skin: Skin is warm and dry.  Psychiatric: She has a normal mood and affect.       Labs:  Estimated body mass index is 36.21 kg/m as calculated from the following:   Height as of 11/30/15: 5\' 5"  (1.651 m).   Weight as of 01/31/16: 98.7 kg (217 lb 9.6 oz).   Imaging Review Plain radiographs demonstrate severe degenerative joint disease of the right knee(s). The bone quality appears to be good for age and reported activity level.  Assessment/Plan:  End stage arthritis, right knee   The patient history, physical examination, clinical judgment of the provider and imaging studies are consistent with end stage degenerative joint disease of the right knee(s) and total knee arthroplasty is deemed medically necessary. The treatment options including medical management, injection therapy arthroscopy and arthroplasty were discussed at length. The risks and benefits of total knee arthroplasty were presented and reviewed. The risks due to aseptic loosening, infection, stiffness, patella tracking problems, thromboembolic complications and other imponderables were discussed. The patient acknowledged the explanation, agreed to proceed with the plan and consent was signed. Patient is being admitted for inpatient treatment for surgery, pain control, PT, OT, prophylactic antibiotics, VTE prophylaxis, progressive ambulation and ADL's and discharge planning. The patient is planning to be discharged home.      Anastasio AuerbachMatthew S. Rheagan Nayak   PA-C  03/09/2016, 2:12 PM

## 2016-03-11 ENCOUNTER — Encounter (HOSPITAL_COMMUNITY): Payer: Self-pay

## 2016-03-11 ENCOUNTER — Encounter (HOSPITAL_COMMUNITY)
Admission: RE | Admit: 2016-03-11 | Discharge: 2016-03-11 | Disposition: A | Discharge status: Home / Self Care | Payer: Medicare Other | Source: Ambulatory Visit | Attending: Orthopedic Surgery | Admitting: Orthopedic Surgery

## 2016-03-11 DIAGNOSIS — I10 Essential (primary) hypertension: Secondary | ICD-10-CM | POA: Diagnosis not present

## 2016-03-11 DIAGNOSIS — M1711 Unilateral primary osteoarthritis, right knee: Secondary | ICD-10-CM | POA: Diagnosis not present

## 2016-03-11 DIAGNOSIS — Z01812 Encounter for preprocedural laboratory examination: Secondary | ICD-10-CM | POA: Insufficient documentation

## 2016-03-11 DIAGNOSIS — Z01818 Encounter for other preprocedural examination: Secondary | ICD-10-CM | POA: Insufficient documentation

## 2016-03-11 DIAGNOSIS — Z0183 Encounter for blood typing: Secondary | ICD-10-CM | POA: Diagnosis not present

## 2016-03-11 HISTORY — DX: Unspecified osteoarthritis, unspecified site: M19.90

## 2016-03-11 LAB — CBC
HEMATOCRIT: 37.1 % (ref 36.0–46.0)
Hemoglobin: 12.7 g/dL (ref 12.0–15.0)
MCH: 29.5 pg (ref 26.0–34.0)
MCHC: 34.2 g/dL (ref 30.0–36.0)
MCV: 86.1 fL (ref 78.0–100.0)
Platelets: 268 10*3/uL (ref 150–400)
RBC: 4.31 MIL/uL (ref 3.87–5.11)
RDW: 14 % (ref 11.5–15.5)
WBC: 7.5 10*3/uL (ref 4.0–10.5)

## 2016-03-11 LAB — BASIC METABOLIC PANEL
ANION GAP: 9 (ref 5–15)
BUN: 14 mg/dL (ref 6–20)
CHLORIDE: 104 mmol/L (ref 101–111)
CO2: 28 mmol/L (ref 22–32)
Calcium: 9.5 mg/dL (ref 8.9–10.3)
Creatinine, Ser: 0.78 mg/dL (ref 0.44–1.00)
GFR calc Af Amer: >60 mL/min (ref >60)
GLUCOSE: 133 mg/dL — AB (ref 65–99)
POTASSIUM: 3.2 mmol/L — AB (ref 3.5–5.1)
Sodium: 141 mmol/L (ref 135–145)

## 2016-03-11 LAB — SURGICAL PCR SCREEN
MRSA, PCR: NEGATIVE
STAPHYLOCOCCUS AUREUS: NEGATIVE

## 2016-03-11 LAB — ABO/RH: ABO/RH(D): O POS

## 2016-03-18 ENCOUNTER — Other Ambulatory Visit: Payer: Self-pay | Admitting: Physician Assistant

## 2016-03-18 ENCOUNTER — Encounter (HOSPITAL_COMMUNITY): Payer: Self-pay | Admitting: *Deleted

## 2016-03-18 ENCOUNTER — Inpatient Hospital Stay (HOSPITAL_COMMUNITY): Payer: Medicare Other | Admitting: Registered Nurse

## 2016-03-18 ENCOUNTER — Inpatient Hospital Stay (HOSPITAL_COMMUNITY)
Admission: RE | Admit: 2016-03-18 | Discharge: 2016-03-19 | DRG: 470 | Disposition: A | Discharge status: Home Health | Payer: Medicare Other | Source: Ambulatory Visit | Attending: Orthopedic Surgery | Admitting: Orthopedic Surgery

## 2016-03-18 ENCOUNTER — Encounter (HOSPITAL_COMMUNITY)
Admission: RE | Disposition: A | Discharge status: Home Health | Payer: Self-pay | Source: Ambulatory Visit | Attending: Orthopedic Surgery

## 2016-03-18 DIAGNOSIS — E669 Obesity, unspecified: Secondary | ICD-10-CM | POA: Diagnosis present

## 2016-03-18 DIAGNOSIS — G8918 Other acute postprocedural pain: Secondary | ICD-10-CM | POA: Diagnosis not present

## 2016-03-18 DIAGNOSIS — M65861 Other synovitis and tenosynovitis, right lower leg: Secondary | ICD-10-CM | POA: Diagnosis not present

## 2016-03-18 DIAGNOSIS — Z96651 Presence of right artificial knee joint: Secondary | ICD-10-CM

## 2016-03-18 DIAGNOSIS — Z8249 Family history of ischemic heart disease and other diseases of the circulatory system: Secondary | ICD-10-CM | POA: Diagnosis not present

## 2016-03-18 DIAGNOSIS — I10 Essential (primary) hypertension: Secondary | ICD-10-CM | POA: Diagnosis not present

## 2016-03-18 DIAGNOSIS — M1711 Unilateral primary osteoarthritis, right knee: Secondary | ICD-10-CM | POA: Diagnosis not present

## 2016-03-18 DIAGNOSIS — Z87891 Personal history of nicotine dependence: Secondary | ICD-10-CM

## 2016-03-18 DIAGNOSIS — Z888 Allergy status to other drugs, medicaments and biological substances status: Secondary | ICD-10-CM

## 2016-03-18 DIAGNOSIS — R269 Unspecified abnormalities of gait and mobility: Secondary | ICD-10-CM | POA: Diagnosis not present

## 2016-03-18 DIAGNOSIS — Z6836 Body mass index (BMI) 36.0-36.9, adult: Secondary | ICD-10-CM

## 2016-03-18 DIAGNOSIS — E7800 Pure hypercholesterolemia, unspecified: Secondary | ICD-10-CM | POA: Diagnosis present

## 2016-03-18 DIAGNOSIS — Z96659 Presence of unspecified artificial knee joint: Secondary | ICD-10-CM

## 2016-03-18 HISTORY — PX: TOTAL KNEE ARTHROPLASTY: SHX125

## 2016-03-18 LAB — TYPE AND SCREEN
ABO/RH(D): O POS
Antibody Screen: NEGATIVE

## 2016-03-18 LAB — GLUCOSE, CAPILLARY
GLUCOSE-CAPILLARY: 162 mg/dL — AB (ref 65–99)
Glucose-Capillary: 121 mg/dL — ABNORMAL HIGH (ref 65–99)

## 2016-03-18 SURGERY — ARTHROPLASTY, KNEE, TOTAL
Anesthesia: Monitor Anesthesia Care | Laterality: Right

## 2016-03-18 MED ORDER — ONDANSETRON HCL 4 MG PO TABS: 4.0000 mg | ORAL_TABLET | Freq: Four times a day (QID) | ORAL | Status: DC | PRN

## 2016-03-18 MED ORDER — FERROUS SULFATE 325 (65 FE) MG PO TABS
325.0000 mg | ORAL_TABLET | Freq: Three times a day (TID) | ORAL | Status: DC
  Administered 2016-03-18 – 2016-03-19 (×3): 325 mg via ORAL
  Filled 2016-03-18 (×3): qty 1

## 2016-03-18 MED ORDER — HYDROCODONE-ACETAMINOPHEN 7.5-325 MG PO TABS
1.0000 | ORAL_TABLET | ORAL | Status: DC
  Administered 2016-03-18 – 2016-03-19 (×6): 1 via ORAL
  Filled 2016-03-18 (×3): qty 1
  Filled 2016-03-18 (×2): qty 2
  Filled 2016-03-18: qty 1

## 2016-03-18 MED ORDER — POLYETHYLENE GLYCOL 3350 17 G PO PACK
17.0000 g | PACK | Freq: Two times a day (BID) | ORAL | Status: DC
  Administered 2016-03-18 – 2016-03-19 (×2): 17 g via ORAL
  Filled 2016-03-18 (×2): qty 1

## 2016-03-18 MED ORDER — DEXAMETHASONE SODIUM PHOSPHATE 10 MG/ML IJ SOLN
INTRAMUSCULAR | Status: DC | PRN
  Administered 2016-03-18: 10 mg via INTRAVENOUS

## 2016-03-18 MED ORDER — ROPIVACAINE HCL 7.5 MG/ML IJ SOLN
INTRAMUSCULAR | Status: DC | PRN
  Administered 2016-03-18: 20 mL via PERINEURAL

## 2016-03-18 MED ORDER — SODIUM CHLORIDE 0.9 % IR SOLN
Status: DC | PRN
  Administered 2016-03-18: 1000 mL

## 2016-03-18 MED ORDER — ONDANSETRON HCL 4 MG/2ML IJ SOLN
INTRAMUSCULAR | Status: AC
  Filled 2016-03-18: qty 2

## 2016-03-18 MED ORDER — ONDANSETRON HCL 4 MG/2ML IJ SOLN: 4.0000 mg | Freq: Four times a day (QID) | INTRAMUSCULAR | Status: DC | PRN

## 2016-03-18 MED ORDER — DEXAMETHASONE SODIUM PHOSPHATE 10 MG/ML IJ SOLN
INTRAMUSCULAR | Status: AC
  Filled 2016-03-18: qty 1

## 2016-03-18 MED ORDER — SODIUM CHLORIDE 0.9 % IV SOLN
INTRAVENOUS | Status: DC
  Administered 2016-03-18: 19:00:00 via INTRAVENOUS
  Filled 2016-03-18 (×4): qty 1000

## 2016-03-18 MED ORDER — DOCUSATE SODIUM 100 MG PO CAPS
100.0000 mg | ORAL_CAPSULE | Freq: Two times a day (BID) | ORAL | Status: DC
  Administered 2016-03-18 – 2016-03-19 (×2): 100 mg via ORAL
  Filled 2016-03-18 (×2): qty 1

## 2016-03-18 MED ORDER — BUPIVACAINE HCL (PF) 0.75 % IJ SOLN
INTRAMUSCULAR | Status: DC | PRN
  Administered 2016-03-18: 15 mg via INTRATHECAL

## 2016-03-18 MED ORDER — PHENOL 1.4 % MT LIQD
1.0000 | OROMUCOSAL | Status: DC | PRN
  Filled 2016-03-18: qty 177

## 2016-03-18 MED ORDER — CHLORHEXIDINE GLUCONATE 4 % EX LIQD: 60.0000 mL | Freq: Once | CUTANEOUS | Status: DC

## 2016-03-18 MED ORDER — BUPIVACAINE HCL (PF) 0.25 % IJ SOLN
INTRAMUSCULAR | Status: DC | PRN
  Administered 2016-03-18: 30 mL

## 2016-03-18 MED ORDER — DEXTROSE 5 % IV SOLN
500.0000 mg | Freq: Four times a day (QID) | INTRAVENOUS | Status: DC | PRN
  Filled 2016-03-18: qty 5

## 2016-03-18 MED ORDER — PROPOFOL 10 MG/ML IV BOLUS
INTRAVENOUS | Status: AC
  Filled 2016-03-18: qty 20

## 2016-03-18 MED ORDER — CELECOXIB 200 MG PO CAPS
200.0000 mg | ORAL_CAPSULE | Freq: Two times a day (BID) | ORAL | Status: DC
  Administered 2016-03-18 – 2016-03-19 (×2): 200 mg via ORAL
  Filled 2016-03-18 (×2): qty 1

## 2016-03-18 MED ORDER — FENTANYL CITRATE (PF) 100 MCG/2ML IJ SOLN
INTRAMUSCULAR | Status: DC | PRN
  Administered 2016-03-18 (×2): 50 ug via INTRAVENOUS

## 2016-03-18 MED ORDER — CEFAZOLIN SODIUM-DEXTROSE 2-4 GM/100ML-% IV SOLN
2.0000 g | INTRAVENOUS | Status: AC
  Administered 2016-03-18: 2 g via INTRAVENOUS
  Filled 2016-03-18: qty 100

## 2016-03-18 MED ORDER — ONDANSETRON HCL 4 MG/2ML IJ SOLN
INTRAMUSCULAR | Status: DC | PRN
  Administered 2016-03-18: 4 mg via INTRAVENOUS

## 2016-03-18 MED ORDER — PROPOFOL 500 MG/50ML IV EMUL
INTRAVENOUS | Status: DC | PRN
  Administered 2016-03-18: 100 ug/kg/min via INTRAVENOUS

## 2016-03-18 MED ORDER — OXYCODONE HCL 5 MG/5ML PO SOLN
5.0000 mg | Freq: Once | ORAL | Status: DC | PRN
  Filled 2016-03-18: qty 5

## 2016-03-18 MED ORDER — METHOCARBAMOL 500 MG PO TABS
500.0000 mg | ORAL_TABLET | Freq: Four times a day (QID) | ORAL | Status: DC | PRN
  Administered 2016-03-19 (×2): 500 mg via ORAL
  Filled 2016-03-18 (×2): qty 1

## 2016-03-18 MED ORDER — HYDROCHLOROTHIAZIDE 25 MG PO TABS
50.0000 mg | ORAL_TABLET | Freq: Every day | ORAL | Status: DC
  Filled 2016-03-18: qty 2

## 2016-03-18 MED ORDER — MIDAZOLAM HCL 5 MG/5ML IJ SOLN
INTRAMUSCULAR | Status: DC | PRN
  Administered 2016-03-18: 2 mg via INTRAVENOUS

## 2016-03-18 MED ORDER — STERILE WATER FOR IRRIGATION IR SOLN
Status: DC | PRN
  Administered 2016-03-18: 2000 mL

## 2016-03-18 MED ORDER — METOCLOPRAMIDE HCL 5 MG/ML IJ SOLN: 5.0000 mg | Freq: Three times a day (TID) | INTRAMUSCULAR | Status: DC | PRN

## 2016-03-18 MED ORDER — LACTATED RINGERS IV SOLN
INTRAVENOUS | Status: DC | PRN
  Administered 2016-03-18 (×2): via INTRAVENOUS

## 2016-03-18 MED ORDER — ROPIVACAINE HCL 7.5 MG/ML IJ SOLN
INTRAMUSCULAR | Status: AC
  Filled 2016-03-18: qty 20

## 2016-03-18 MED ORDER — KETOROLAC TROMETHAMINE 30 MG/ML IJ SOLN
INTRAMUSCULAR | Status: AC
  Filled 2016-03-18: qty 1

## 2016-03-18 MED ORDER — TRANEXAMIC ACID 1000 MG/10ML IV SOLN
1000.0000 mg | INTRAVENOUS | Status: AC
  Administered 2016-03-18: 1000 mg via INTRAVENOUS
  Filled 2016-03-18: qty 10

## 2016-03-18 MED ORDER — CEFAZOLIN SODIUM-DEXTROSE 2-4 GM/100ML-% IV SOLN
INTRAVENOUS | Status: AC
  Filled 2016-03-18: qty 100

## 2016-03-18 MED ORDER — FENTANYL CITRATE (PF) 100 MCG/2ML IJ SOLN: 25.0000 ug | INTRAMUSCULAR | Status: DC | PRN

## 2016-03-18 MED ORDER — SODIUM CHLORIDE 0.9 % IJ SOLN
INTRAMUSCULAR | Status: DC | PRN
  Administered 2016-03-18: 30 mL

## 2016-03-18 MED ORDER — SODIUM CHLORIDE 0.9 % IJ SOLN
INTRAMUSCULAR | Status: AC
  Filled 2016-03-18: qty 50

## 2016-03-18 MED ORDER — PROPOFOL 10 MG/ML IV BOLUS
INTRAVENOUS | Status: AC
  Filled 2016-03-18: qty 60

## 2016-03-18 MED ORDER — 0.9 % SODIUM CHLORIDE (POUR BTL) OPTIME
TOPICAL | Status: DC | PRN
  Administered 2016-03-18: 1000 mL

## 2016-03-18 MED ORDER — ASPIRIN 81 MG PO CHEW
81.0000 mg | CHEWABLE_TABLET | Freq: Two times a day (BID) | ORAL | Status: DC
  Administered 2016-03-18 – 2016-03-19 (×2): 81 mg via ORAL
  Filled 2016-03-18 (×2): qty 1

## 2016-03-18 MED ORDER — MIDAZOLAM HCL 2 MG/2ML IJ SOLN
INTRAMUSCULAR | Status: AC
  Filled 2016-03-18: qty 2

## 2016-03-18 MED ORDER — OXYCODONE HCL 5 MG PO TABS: 5.0000 mg | ORAL_TABLET | Freq: Once | ORAL | Status: DC | PRN

## 2016-03-18 MED ORDER — DEXAMETHASONE SODIUM PHOSPHATE 10 MG/ML IJ SOLN
10.0000 mg | Freq: Once | INTRAMUSCULAR | Status: AC
  Administered 2016-03-19: 10 mg via INTRAVENOUS
  Filled 2016-03-18: qty 1

## 2016-03-18 MED ORDER — BISACODYL 10 MG RE SUPP: 10.0000 mg | Freq: Every day | RECTAL | Status: DC | PRN

## 2016-03-18 MED ORDER — CEFAZOLIN SODIUM-DEXTROSE 2-4 GM/100ML-% IV SOLN
2.0000 g | Freq: Four times a day (QID) | INTRAVENOUS | Status: AC
  Administered 2016-03-18 – 2016-03-19 (×2): 2 g via INTRAVENOUS
  Filled 2016-03-18 (×2): qty 100

## 2016-03-18 MED ORDER — ALUM & MAG HYDROXIDE-SIMETH 200-200-20 MG/5ML PO SUSP
30.0000 mL | ORAL | Status: DC | PRN
Start: 2016-03-18 — End: 2016-03-19

## 2016-03-18 MED ORDER — MENTHOL 3 MG MT LOZG: 1.0000 | LOZENGE | OROMUCOSAL | Status: DC | PRN

## 2016-03-18 MED ORDER — FENTANYL CITRATE (PF) 100 MCG/2ML IJ SOLN
INTRAMUSCULAR | Status: AC
  Filled 2016-03-18: qty 2

## 2016-03-18 MED ORDER — KETOROLAC TROMETHAMINE 30 MG/ML IJ SOLN
INTRAMUSCULAR | Status: DC | PRN
  Administered 2016-03-18: 30 mg

## 2016-03-18 MED ORDER — MECLIZINE HCL 25 MG PO TABS
25.0000 mg | ORAL_TABLET | Freq: Three times a day (TID) | ORAL | Status: DC | PRN
  Filled 2016-03-18: qty 1

## 2016-03-18 MED ORDER — HYDROMORPHONE HCL 1 MG/ML IJ SOLN: 0.5000 mg | INTRAMUSCULAR | Status: DC | PRN

## 2016-03-18 MED ORDER — METOCLOPRAMIDE HCL 5 MG PO TABS: 5.0000 mg | ORAL_TABLET | Freq: Three times a day (TID) | ORAL | Status: DC | PRN

## 2016-03-18 MED ORDER — HYDROXYZINE HCL 25 MG PO TABS: 25.0000 mg | ORAL_TABLET | Freq: Three times a day (TID) | ORAL | Status: DC | PRN

## 2016-03-18 MED ORDER — PROMETHAZINE HCL 25 MG/ML IJ SOLN
INTRAMUSCULAR | Status: AC
  Filled 2016-03-18: qty 1

## 2016-03-18 MED ORDER — BUPIVACAINE HCL (PF) 0.25 % IJ SOLN
INTRAMUSCULAR | Status: AC
  Filled 2016-03-18: qty 30

## 2016-03-18 MED ORDER — MAGNESIUM CITRATE PO SOLN: 1.0000 | Freq: Once | ORAL | Status: DC | PRN

## 2016-03-18 SURGICAL SUPPLY — 45 items
ADH SKN CLS APL DERMABOND .7 (GAUZE/BANDAGES/DRESSINGS) ×1
BAG DECANTER FOR FLEXI CONT (MISCELLANEOUS) IMPLANT
BAG SPEC THK2 15X12 ZIP CLS (MISCELLANEOUS)
BAG ZIPLOCK 12X15 (MISCELLANEOUS) IMPLANT
BANDAGE ACE 6X5 VEL STRL LF (GAUZE/BANDAGES/DRESSINGS) ×2 IMPLANT
BLADE SAW SGTL 13.0X1.19X90.0M (BLADE) ×2 IMPLANT
BOWL SMART MIX CTS (DISPOSABLE) ×2 IMPLANT
CAPT KNEE TOTAL 3 ATTUNE ×1 IMPLANT
CEMENT HV SMART SET (Cement) ×2 IMPLANT
CLOTH BEACON ORANGE TIMEOUT ST (SAFETY) ×2 IMPLANT
CUFF TOURN SGL QUICK 34 (TOURNIQUET CUFF) ×2
CUFF TRNQT CYL 34X4X40X1 (TOURNIQUET CUFF) ×1 IMPLANT
DECANTER SPIKE VIAL GLASS SM (MISCELLANEOUS) ×2 IMPLANT
DERMABOND ADVANCED (GAUZE/BANDAGES/DRESSINGS) ×1
DERMABOND ADVANCED .7 DNX12 (GAUZE/BANDAGES/DRESSINGS) ×1 IMPLANT
DRAPE U-SHAPE 47X51 STRL (DRAPES) ×2 IMPLANT
DRESSING AQUACEL AG SP 3.5X10 (GAUZE/BANDAGES/DRESSINGS) ×1 IMPLANT
DRSG AQUACEL AG SP 3.5X10 (GAUZE/BANDAGES/DRESSINGS) ×2
DURAPREP 26ML APPLICATOR (WOUND CARE) ×4 IMPLANT
ELECT REM PT RETURN 9FT ADLT (ELECTROSURGICAL) ×2
ELECTRODE REM PT RTRN 9FT ADLT (ELECTROSURGICAL) ×1 IMPLANT
GLOVE BIOGEL M 7.0 STRL (GLOVE) IMPLANT
GLOVE BIOGEL PI IND STRL 7.5 (GLOVE) ×1 IMPLANT
GLOVE BIOGEL PI IND STRL 8.5 (GLOVE) ×1 IMPLANT
GLOVE BIOGEL PI INDICATOR 7.5 (GLOVE) ×1
GLOVE BIOGEL PI INDICATOR 8.5 (GLOVE) ×1
GLOVE ECLIPSE 8.0 STRL XLNG CF (GLOVE) ×2 IMPLANT
GLOVE ORTHO TXT STRL SZ7.5 (GLOVE) ×4 IMPLANT
GOWN STRL REUS W/TWL LRG LVL3 (GOWN DISPOSABLE) ×2 IMPLANT
GOWN STRL REUS W/TWL XL LVL3 (GOWN DISPOSABLE) ×2 IMPLANT
HANDPIECE INTERPULSE COAX TIP (DISPOSABLE) ×2
MANIFOLD NEPTUNE II (INSTRUMENTS) ×2 IMPLANT
PACK TOTAL KNEE CUSTOM (KITS) ×2 IMPLANT
POSITIONER SURGICAL ARM (MISCELLANEOUS) ×2 IMPLANT
SET HNDPC FAN SPRY TIP SCT (DISPOSABLE) ×1 IMPLANT
SET PAD KNEE POSITIONER (MISCELLANEOUS) ×2 IMPLANT
SUT MNCRL AB 4-0 PS2 18 (SUTURE) ×2 IMPLANT
SUT VIC AB 1 CT1 36 (SUTURE) ×2 IMPLANT
SUT VIC AB 2-0 CT1 27 (SUTURE) ×6
SUT VIC AB 2-0 CT1 TAPERPNT 27 (SUTURE) ×3 IMPLANT
SUT VLOC 180 0 24IN GS25 (SUTURE) ×2 IMPLANT
SYR 50ML LL SCALE MARK (SYRINGE) ×2 IMPLANT
TRAY FOLEY CATH 14FR (SET/KITS/TRAYS/PACK) ×1 IMPLANT
WRAP KNEE MAXI GEL POST OP (GAUZE/BANDAGES/DRESSINGS) ×2 IMPLANT
YANKAUER SUCT BULB TIP 10FT TU (MISCELLANEOUS) ×2 IMPLANT

## 2016-03-18 NOTE — Discharge Instructions (Signed)

## 2016-03-18 NOTE — Anesthesia Procedure Notes (Signed)
Spinal  Start time: 03/18/2016 2:44 PM End time: 03/18/2016 2:46 PM Staffing Resident/CRNA: ALDAY, STEPHEN R Preanesthetic Checklist Completed: patient identified, site marked, surgical consent, pre-op evaluation, timeout performed, IV checked, risks and benefits discussed and monitors and equipment checked Spinal Block Patient position: sitting Prep: Betadine Patient monitoring: heart rate, cardiac monitor, continuous pulse ox and blood pressure Approach: midline Location: L3-4 Injection technique: single-shot Needle Needle type: Spinocan  Needle gauge: 24 G Needle length: 10 cm Needle insertion depth: 8 cm Assessment Sensory level: T6 Additional Notes Timeout performed. SAB kit date checked. SAB without difficulty     

## 2016-03-18 NOTE — Transfer of Care (Signed)
Immediate Anesthesia Transfer of Care Note  Patient: Wanda Newton  Procedure(s) Performed: Procedure(s): TOTAL KNEE ARTHROPLASTY (Right)  Patient Location: PACU  Anesthesia Type:Spinal  Level of Consciousness:  sedated, patient cooperative and responds to stimulation  Airway & Oxygen Therapy:Patient Spontanous Breathing and Patient connected to face mask oxgen  Post-op Assessment:  Report given to PACU RN and Post -op Vital signs reviewed and stable  Post vital signs:  Reviewed and stable  Last Vitals:  Vitals:   03/18/16 1241  BP: (!) 142/83  Pulse: 85  Resp: 18  Temp: 36.6 C    Complications: No apparent anesthesia complications

## 2016-03-18 NOTE — Op Note (Signed)
NAME:  Wanda Newton                      MEDICAL RECORD NO.:  [id number]                             FACILITY:  [facility]      PHYSICIAN:  Madlyn Frankel. Charlann Boxer, M.D.  DATE OF BIRTH:  1951/01/19      DATE OF PROCEDURE:  03/18/2016                                     OPERATIVE REPORT         PREOPERATIVE DIAGNOSIS:  Right knee osteoarthritis.      POSTOPERATIVE DIAGNOSIS:  Right knee osteoarthritis.      FINDINGS:  The patient was noted to have complete loss of cartilage and   bone-on-bone arthritis with associated osteophytes in all three compartments of   the knee worse medially with a significant synovitis and associated effusion.      PROCEDURE:  Right total knee replacement.      COMPONENTS USED:  DePuy Attune rotating platform posterior stabilized knee   system, a size 4 standard femur, 3 tibia, size 12 PS AOX insert, and 35 anatomic patellar   button.      SURGEON:  Madlyn Frankel. Charlann Boxer, M.D.      ASSISTANT:  Lanney Gins, PA-C.      ANESTHESIA:  Spinal.      SPECIMENS:  None.      COMPLICATION:  None.      DRAINS:  None.  EBL: <100cc      TOURNIQUET TIME:   Total Tourniquet Time Documented: Thigh (Right) - 30 minutes Total: Thigh (Right) - 30 minutes  .      The patient was stable to the recovery room.      INDICATION FOR PROCEDURE:  Wanda Newton is a 65 y.o. female patient of   mine.  The patient had been seen, evaluated, and treated conservatively in the   office with medication, activity modification, and injections.  The patient had   radiographic changes of bone-on-bone arthritis with endplate sclerosis and osteophytes noted.      The patient failed conservative measures including medication, injections, and activity modification, and at this point was ready for more definitive measures.   Based on the radiographic changes and failed conservative measures, the patient   decided to proceed with total knee replacement.  Risks of infection,   DVT, component  failure, need for revision surgery, postop course, and   expectations were all   discussed and reviewed.  Consent was obtained for benefit of pain   relief.      PROCEDURE IN DETAIL:  The patient was brought to the operative theater.   Once adequate anesthesia, preoperative antibiotics, 2 gm of Ancef, 1 gm of Tranexamic Acid, and 10 mg of Decadron administered, the patient was positioned supine with the right thigh tourniquet placed.  The  right lower extremity was prepped and draped in sterile fashion.  A time-   out was performed identifying the patient, planned procedure, and   extremity.      The right lower extremity was placed in the [facility] leg holder.  The leg was   exsanguinated, tourniquet elevated to 250 mmHg.  A midline incision was   made  followed by median parapatellar arthrotomy.  Following initial   exposure, attention was first directed to the patella.  Precut   measurement was noted to be 20 mm.  I resected down to 13 mm and used a   35 anatomic patellar button to restore patellar height as well as cover the cut   surface.      The lug holes were drilled and a metal shim was placed to protect the   patella from retractors and saw blades.      At this point, attention was now directed to the femur.  The femoral   canal was opened with a drill, irrigated to try to prevent fat emboli.  An   intramedullary rod was passed at 3 degrees valgus, 9 mm of bone was   resected off the distal femur.  Following this resection, the tibia was   subluxated anteriorly.  Using the extramedullary guide, 2 mm of bone was resected off   the proximal medial tibia.  We confirmed the gap would be   stable medially and laterally with a size 6 spacer block as well as confirmed   the cut was perpendicular in the coronal plane, checking with an alignment rod.      Once this was done, I sized the femur to be a size 4 in the anterior-   posterior dimension, chose a standard component based on  medial and   lateral dimension.  The size 4 rotation block was then pinned in   position anterior referenced using the C-clamp to set rotation.  The   anterior, posterior, and  chamfer cuts were made without difficulty nor   notching making certain that I was along the anterior cortex to help   with flexion gap stability.      The final box cut was made off the lateral aspect of distal femur.      At this point, the tibia was sized to be a size 3, the size 3 tray was   then pinned in position through the medial third of the tubercle,   drilled, and keel punched.  Trial reduction was now carried with a size 4 femur,  3 tibia, a size 10 then 12 PS insert, and the 35 anatomic patella botton.  The knee was brought to   extension, full extension with good flexion stability with the patella   tracking through the trochlea without application of pressure.  Given   all these findings the femoral lugs were drilled and then the trial components removed.  Final components were   opened and cement was mixed.  The knee was irrigated with normal saline   solution and pulse lavage.  The synovial lining was   then injected with 30 cc of 0.25% Marcaine without epinephrine and 1 cc of Toradol plus 30 cc of NS for a total of 61 cc.      The knee was irrigated.  Final implants were then cemented onto clean and   dried cut surfaces of bone with the knee brought to extension with a 12 PS trial insert.      Once the cement had fully cured, the excess cement was removed   throughout the knee.  I confirmed I was satisfied with the range of   motion and stability, and the final 12 PS AOX insert was chosen.  It was   placed into the knee.      The tourniquet had been let down at 30 minutes.  No  significant   hemostasis required.  The   extensor mechanism was then reapproximated using #1 Vicryl and #0 V-lock sutures with the knee   in flexion.  The   remaining wound was closed with 2-0 Vicryl and running 4-0  Monocryl.   The knee was cleaned, dried, dressed sterilely using Dermabond and   Aquacel dressing.  The patient was then   brought to recovery room in stable condition, tolerating the procedure   well.   Please note that Physician Assistant, Lanney GinsMatthew Babish, PA-C, was present for the entirety of the case, and was utilized for pre-operative positioning, peri-operative retractor management, general facilitation of the procedure.  He was also utilized for primary wound closure at the end of the case.              Madlyn FrankelMatthew D. Charlann Boxerlin, M.D.    03/18/2016 4:31 PM

## 2016-03-18 NOTE — Anesthesia Preprocedure Evaluation (Signed)
Anesthesia Evaluation  Patient identified by MRN, date of birth, ID band Patient awake    Reviewed: Allergy & Precautions, H&P , NPO status , Patient's Chart, lab work & pertinent test results  Airway Mallampati: II   Neck ROM: full    Dental   Pulmonary former smoker,    breath sounds clear to auscultation       Cardiovascular hypertension,  Rhythm:regular Rate:Normal     Neuro/Psych    GI/Hepatic   Endo/Other  diabetesobese  Renal/GU      Musculoskeletal  (+) Arthritis ,   Abdominal   Peds  Hematology   Anesthesia Other Findings   Reproductive/Obstetrics                             Anesthesia Physical Anesthesia Plan  ASA: II  Anesthesia Plan: MAC and Spinal   Post-op Pain Management:  Regional for Post-op pain   Induction: Intravenous  Airway Management Planned: Simple Face Mask  Additional Equipment:   Intra-op Plan:   Post-operative Plan:   Informed Consent: I have reviewed the patients History and Physical, chart, labs and discussed the procedure including the risks, benefits and alternatives for the proposed anesthesia with the patient or authorized representative who has indicated his/her understanding and acceptance.     Plan Discussed with: CRNA, Anesthesiologist and Surgeon  Anesthesia Plan Comments:         Anesthesia Quick Evaluation

## 2016-03-18 NOTE — Anesthesia Procedure Notes (Signed)
Anesthesia Regional Block:  Adductor canal block  Pre-Anesthetic Checklist: ,, timeout performed, Correct Patient, Correct Site, Correct Laterality, Correct Procedure, Correct Position, site marked, Risks and benefits discussed,  Surgical consent,  Pre-op evaluation,  At surgeon's request and post-op pain management  Laterality: Right  Prep: chloraprep       Needles:  Injection technique: Single-shot  Needle Type: Echogenic Needle     Needle Length: 9cm 9 cm Needle Gauge: 21 and 21 G    Additional Needles:  Procedures: ultrasound guided (picture in chart) Adductor canal block Narrative:  Start time: 03/18/2016 2:05 PM End time: 03/18/2016 2:12 PM Injection made incrementally with aspirations every 5 mL.  Performed by: Personally  Anesthesiologist: Moneka Mcquinn  Additional Notes: Pt tolerated the procedure well.

## 2016-03-18 NOTE — Anesthesia Postprocedure Evaluation (Signed)
Anesthesia Post Note  Patient: Wanda PoseyMary L Newton  Procedure(s) Performed: Procedure(s) (LRB): TOTAL KNEE ARTHROPLASTY (Right)  Patient location during evaluation: PACU Anesthesia Type: Spinal Level of consciousness: oriented and awake and alert Pain management: pain level controlled Vital Signs Assessment: post-procedure vital signs reviewed and stable Respiratory status: spontaneous breathing, respiratory function stable and patient connected to nasal cannula oxygen Cardiovascular status: blood pressure returned to baseline and stable Postop Assessment: no headache and no backache Anesthetic complications: no       Last Vitals:  Vitals:   03/18/16 1745 03/18/16 1845  BP: 140/82 (!) 149/82  Pulse: 62 66  Resp: 16 16  Temp: 36.9 C 36.8 C    Last Pain:  Vitals:   03/18/16 1915  TempSrc:   PainSc: 0-No pain                 Jazir Newey S

## 2016-03-18 NOTE — Interval H&P Note (Signed)
History and Physical Interval Note:  03/18/2016 1:44 PM  Wanda Newton  has presented today for surgery, with the diagnosis of RIGHT KNEE OA  The various methods of treatment have been discussed with the patient and family. After consideration of risks, benefits and other options for treatment, the patient has consented to  Procedure(s): TOTAL KNEE ARTHROPLASTY (Right) as a surgical intervention .  The patient's history has been reviewed, patient examined, no change in status, stable for surgery.  I have reviewed the patient's chart and labs.  Questions were answered to the patient's satisfaction.     Shelda PalLIN,Nashly Olsson D

## 2016-03-19 DIAGNOSIS — E669 Obesity, unspecified: Secondary | ICD-10-CM | POA: Diagnosis present

## 2016-03-19 LAB — CBC
HEMATOCRIT: 31.5 % — AB (ref 36.0–46.0)
HEMOGLOBIN: 10.7 g/dL — AB (ref 12.0–15.0)
MCH: 29.8 pg (ref 26.0–34.0)
MCHC: 34 g/dL (ref 30.0–36.0)
MCV: 87.7 fL (ref 78.0–100.0)
PLATELETS: 251 10*3/uL (ref 150–400)
RBC: 3.59 MIL/uL — ABNORMAL LOW (ref 3.87–5.11)
RDW: 14.1 % (ref 11.5–15.5)
WBC: 11.4 10*3/uL — ABNORMAL HIGH (ref 4.0–10.5)

## 2016-03-19 LAB — BASIC METABOLIC PANEL
ANION GAP: 7 (ref 5–15)
BUN: 14 mg/dL (ref 6–20)
CO2: 26 mmol/L (ref 22–32)
Calcium: 8.5 mg/dL — ABNORMAL LOW (ref 8.9–10.3)
Chloride: 102 mmol/L (ref 101–111)
Creatinine, Ser: 0.85 mg/dL (ref 0.44–1.00)
GLUCOSE: 163 mg/dL — AB (ref 65–99)
POTASSIUM: 3.8 mmol/L (ref 3.5–5.1)
Sodium: 135 mmol/L (ref 135–145)

## 2016-03-19 MED ORDER — POLYETHYLENE GLYCOL 3350 17 G PO PACK: 17.0000 g | PACK | Freq: Two times a day (BID) | ORAL | 0 refills | Status: DC

## 2016-03-19 MED ORDER — CELECOXIB 200 MG PO CAPS: 200.0000 mg | ORAL_CAPSULE | Freq: Two times a day (BID) | ORAL | 0 refills | Status: DC

## 2016-03-19 MED ORDER — METHOCARBAMOL 500 MG PO TABS: 500.0000 mg | ORAL_TABLET | Freq: Four times a day (QID) | ORAL | 0 refills | Status: DC | PRN

## 2016-03-19 MED ORDER — FERROUS SULFATE 325 (65 FE) MG PO TABS: 325.0000 mg | ORAL_TABLET | Freq: Three times a day (TID) | ORAL | 3 refills | Status: DC

## 2016-03-19 MED ORDER — DOCUSATE SODIUM 100 MG PO CAPS: 100.0000 mg | ORAL_CAPSULE | Freq: Two times a day (BID) | ORAL | 0 refills | Status: DC

## 2016-03-19 MED ORDER — ASPIRIN 81 MG PO CHEW: 81.0000 mg | CHEWABLE_TABLET | Freq: Two times a day (BID) | ORAL | 0 refills | Status: DC

## 2016-03-19 MED ORDER — HYDROCODONE-ACETAMINOPHEN 7.5-325 MG PO TABS: 1.0000 | ORAL_TABLET | ORAL | 0 refills | Status: DC | PRN

## 2016-03-19 NOTE — Evaluation (Signed)
Physical Therapy Evaluation Patient Details Name: Daylene PoseyMary L Killough MRN: [id number] DOB: 12/29/1950 Today's Date: 03/19/2016   History of Present Illness  Pt s/p R TKR and with hx of vertigo and DM  Clinical Impression  Pt s/p R TKR presents with decreased R LE strength/ROM and post op pain limiting functional mobility.  Pt should progress to dc home with assist of family/friends and HHPT follow up.    Follow Up Recommendations Home health PT    Equipment Recommendations  Rolling walker with 5" wheels    Recommendations for Other Services OT consult     Precautions / Restrictions Precautions Precautions: Fall;Knee Restrictions Weight Bearing Restrictions: No Other Position/Activity Restrictions: WBAT      Mobility  Bed Mobility Overal bed mobility: Needs Assistance Bed Mobility: Supine to Sit     Supine to sit: Min assist     General bed mobility comments: cues for sequence and use of L LE to self assist  Transfers Overall transfer level: Needs assistance Equipment used: Rolling walker (2 wheeled) Transfers: Sit to/from Stand Sit to Stand: Min assist         General transfer comment: cues for LE management and use of UEs to self assist  Ambulation/Gait Ambulation/Gait assistance: Min assist;Min guard Ambulation Distance (Feet): 78 Feet Assistive device: Rolling walker (2 wheeled) Gait Pattern/deviations: Step-to pattern;Decreased step length - right;Decreased step length - left;Shuffle;Trunk flexed Gait velocity: decr Gait velocity interpretation: Below normal speed for age/gender General Gait Details: cues for sequence, posture and position from [employer]  Stairs            Wheelchair Mobility    Modified Rankin (Stroke Patients Only)       Balance Overall balance assessment: No apparent balance deficits (not formally assessed)                                           Pertinent Vitals/Pain Pain Assessment: 0-10 Pain Score: 3   Pain Location: R knee Pain Descriptors / Indicators: Aching;Sore Pain Intervention(s): Limited activity within patient's tolerance;Monitored during session;Premedicated before session;Ice applied    Home Living Family/patient expects to be discharged to:: Private residence Living Arrangements: Children Available Help at Discharge: Family;Friend(s) Type of Home: Apartment Home Access: Stairs to enter Entrance Stairs-Rails: Right Entrance Stairs-Number of Steps: 14 Home Layout: One level Home Equipment: None Additional Comments: Pt says she can borrow RW from friend    Prior Function Level of Independence: Independent               Hand Dominance        Extremity/Trunk Assessment   Upper Extremity Assessment Upper Extremity Assessment: Overall WFL for tasks assessed    Lower Extremity Assessment Lower Extremity Assessment: RLE deficits/detail RLE Deficits / Details: 3-/5 quads with AAROM at knee -10 - 75    Cervical / Trunk Assessment Cervical / Trunk Assessment: Normal  Communication   Communication: No difficulties  Cognition Arousal/Alertness: Awake/alert Behavior During Therapy: WFL for tasks assessed/performed Overall Cognitive Status: Within Functional Limits for tasks assessed                      General Comments      Exercises Total Joint Exercises Ankle Circles/Pumps: AROM;Both;15 reps;Supine Quad Sets: AROM;Both;10 reps;Supine Heel Slides: AAROM;Right;15 reps;Supine Straight Leg Raises: AAROM;Right;15 reps;Supine   Assessment/Plan    PT Assessment Patient needs continued  PT services  PT Problem List Decreased strength;Decreased range of motion;Decreased activity tolerance;Decreased mobility;Decreased knowledge of use of DME;Pain;Obesity          PT Treatment Interventions DME instruction;Gait training;Stair training;Functional mobility training;Therapeutic activities;Therapeutic exercise;Patient/family education    PT Goals  (Current goals can be found in the Care Plan section)  Acute Rehab PT Goals Patient Stated Goal: Regain IND  PT Goal Formulation: With patient Time For Goal Achievement: 03/22/16 Potential to Achieve Goals: Good    Frequency 7X/week   Barriers to discharge        Co-evaluation               End of Session Equipment Utilized During Treatment: Gait belt Activity Tolerance: Patient tolerated treatment well Patient left: in chair;with call bell/phone within reach;with chair alarm set Nurse Communication: Mobility status         Time: [id number] PT Time Calculation (min) (ACUTE ONLY): 33 min   Charges:   PT Evaluation $PT Eval Low Complexity: 1 Procedure PT Treatments $Therapeutic Exercise: 8-22 mins   PT G Codes:        Jaynee Winters 03/19/2016, 12:10 PM

## 2016-03-19 NOTE — Progress Notes (Signed)
Physical Therapy Treatment Patient Details Name: Wanda PoseyMary L Hamberger MRN: [id number] DOB: 03/26/1950 Today's Date: 03/19/2016    History of Present Illness Pt s/p R TKR and with hx of vertigo and DM    PT Comments    Pt very motivated and progressing well with mobility.  Reviewed stairs with written instructions provided.  Follow Up Recommendations  Home health PT     Equipment Recommendations  Rolling walker with 5" wheels    Recommendations for Other Services OT consult     Precautions / Restrictions Precautions Precautions: Fall;Knee Restrictions Weight Bearing Restrictions: No Other Position/Activity Restrictions: WBAT    Mobility  Bed Mobility Overal bed mobility: Needs Assistance Bed Mobility: Supine to Sit     Supine to sit: Min assist     General bed mobility comments: up in chair   Transfers Overall transfer level: Needs assistance Equipment used: None Transfers: Sit to/from Stand Sit to Stand: Supervision         General transfer comment: cues for LE management and use of UEs to self assist  Ambulation/Gait Ambulation/Gait assistance: Min guard;Supervision Ambulation Distance (Feet): 100 Feet (and 75') Assistive device: Rolling walker (2 wheeled) Gait Pattern/deviations: Step-to pattern;Decreased step length - right;Decreased step length - left;Shuffle;Trunk flexed Gait velocity: decr Gait velocity interpretation: Below normal speed for age/gender General Gait Details: cues for sequence, posture and position from RW   Stairs Stairs: Yes   Stair Management: One rail Left;Step to pattern;Forwards;With crutches Number of Stairs: 4 General stair comments: cues for sequence and foot/crutch placement  Wheelchair Mobility    Modified Rankin (Stroke Patients Only)       Balance Overall balance assessment: No apparent balance deficits (not formally assessed)                                  Cognition Arousal/Alertness:  Awake/alert Behavior During Therapy: WFL for tasks assessed/performed Overall Cognitive Status: Within Functional Limits for tasks assessed                      Exercises Total Joint Exercises Ankle Circles/Pumps: AROM;Both;15 reps;Supine Quad Sets: AROM;Both;10 reps;Supine Heel Slides: AAROM;Right;15 reps;Supine Straight Leg Raises: AAROM;Right;15 reps;Supine    General Comments        Pertinent Vitals/Pain Pain Assessment: 0-10 Pain Score: 3  Pain Location: R knee Pain Descriptors / Indicators: Aching;Sore Pain Intervention(s): Limited activity within patient's tolerance;Monitored during session;Premedicated before session;Ice applied    Home Living Family/patient expects to be discharged to:: Private residence Living Arrangements: Children Available Help at Discharge: Family;Friend(s) Type of Home: Apartment Home Access: Stairs to enter Entrance Stairs-Rails: Right Home Layout: One level Home Equipment: None Additional Comments: Pt says she can borrow RW from friend    Prior Function Level of Independence: Independent          PT Goals (current goals can now be found in the care plan section) Acute Rehab PT Goals Patient Stated Goal: to dance and 'get my walk back"  PT Goal Formulation: With patient Time For Goal Achievement: 03/22/16 Potential to Achieve Goals: Good Progress towards PT goals: Progressing toward goals    Frequency    7X/week      PT Plan Current plan remains appropriate    Co-evaluation             End of Session Equipment Utilized During Treatment: Gait belt Activity Tolerance: Patient tolerated treatment well Patient left:  in chair;with call bell/phone within reach;with chair alarm set     Time: 1428-1500 PT Time Calculation (min) (ACUTE ONLY): 32 min  Charges:  $Gait Training: 23-37 mins $Therapeutic Exercise: 8-22 mins                    G Codes:      Jordie Schreur 03/19/2016, 3:14 PM

## 2016-03-19 NOTE — Care Management Note (Signed)
Case Management Note  Patient Details  Name: TARAOLUWA THAKUR MRN: [id number] Date of Birth: 1950-06-02  Subjective/Objective:                  TOTAL KNEE ARTHROPLASTY (Right)  Action/Plan: Discharge planning Expected Discharge Date:  03/19/16               Expected Discharge Plan:  [place]  In-House Referral:     Discharge planning Services  CM Consult  Post Acute Care Choice:  Home Health Choice offered to:  Patient  DME Arranged:  3-N-1, Walker rolling DME Agency:  [place]:  PT [place] Agency:  [facility]  Status of Service:  Completed, signed off  If discussed at [employer], dates discussed:    Additional Comments: Cm met with pt in room to offer choice of home health agency. Pt chooses [facility] to render HHPT.  Referral given to [facility] rep, Tim.  CM notified [place] DME rep, Larene Beach to please deliver there rolling walker and 3n1 to room prior to discharge.  No other Cm needs were communicated. Dellie Catholic, RN 03/19/2016, 1:39 PM

## 2016-03-19 NOTE — Progress Notes (Signed)
     Subjective: 1 Day Post-Op Procedure(s) (LRB): TOTAL KNEE ARTHROPLASTY (Right)   Patient reports pain as mild, pain controlled. No events throughout the night. Looking forward to working with PT.  Ready to be discharged home if she does well with PT.   Objective:   VITALS:   Vitals:   03/19/16 0318 03/19/16 0452  BP:  (!) 115/56  Pulse:  (!) 54  Resp:  16  Temp: 97.4 F (36.3 C) 97.5 F (36.4 C)    Dorsiflexion/Plantar flexion intact Incision: dressing C/D/I No cellulitis present Compartment soft  LABS  Recent Labs  03/19/16 0436  HGB 10.7*  HCT 31.5*  WBC 11.4*  PLT 251     Recent Labs  03/19/16 0436  NA 135  K 3.8  BUN 14  CREATININE 0.85  GLUCOSE 163*     Assessment/Plan: 1 Day Post-Op Procedure(s) (LRB): TOTAL KNEE ARTHROPLASTY (Right) Foley cath d/c'ed Advance diet Up with therapy D/C IV fluids Discharge home [facility]. Follow up with OLIN,Obrien Huskins D in 2 weeks.  Contact information:  [facility] [address] [place] [id number] [phone]    Obese (BMI 30-39.9) Estimated body mass index is 36.44 kg/m as calculated from the following:   Height as of this encounter: 5\' 5"  (1.651 m).   Weight as of this encounter: 99.3 kg (219 lb). Patient also counseled that weight may inhibit the healing process Patient counseled that losing weight will help with future health issues         Anastasio AuerbachMatthew S. Sharbel Sahagun   PAC  03/19/2016, 8:12 AM

## 2016-03-19 NOTE — Evaluation (Signed)
Occupational Therapy Evaluation Patient Details Name: Wanda Newton MRN: [id number] [id number] DOB: 06/21/1950 Today's Date: 03/19/2016    History of Present Illness Pt s/p R TKR and with hx of vertigo and DM   Clinical Impression   Patient evaluated by Occupational Therapy with no further acute OT needs identified. All education has been completed and the patient has no further questions. Pt demonstrates good safety awareness and is supervision for ADLs.   See below for any follow-up Occupational Therapy or equipment needs. OT is signing off. Thank you for this referral.      Follow Up Recommendations  No OT follow up;Supervision - Intermittent    Equipment Recommendations  3 in 1 bedside commode    Recommendations for Other Services       Precautions / Restrictions Precautions Precautions: Fall;Knee Restrictions Weight Bearing Restrictions: No Other Position/Activity Restrictions: WBAT      Mobility Bed Mobility            General bed mobility comments: up in chair   Transfers Overall transfer level: Needs assistance Equipment used: Rolling walker (2 wheeled) Transfers: Sit to/from Stand Sit to Stand: Supervision             Balance Overall balance assessment: No apparent balance deficits (not formally assessed)                                          ADL Overall ADL's : Needs assistance/impaired Eating/Feeding: Independent   Grooming: Wash/dry hands;Wash/dry face;Oral care;Brushing hair;Supervision/safety;Standing   Upper Body Bathing: Supervision/ safety;Sitting   Lower Body Bathing: Set up;Sit to/from stand   Upper Body Dressing : Set up;Sitting   Lower Body Dressing: Supervision/safety;Sit to/from stand   Toilet Transfer: Supervision/safety;Ambulation;Comfort height toilet;Grab bars;RW   Toileting- Clothing Manipulation and Hygiene: Supervision/safety;Sit to/from stand   Tub/ Shower Transfer: Tub transfer;3 in 1;Rolling  walker;Ambulation Tub/Shower Transfer Details (indicate cue type and reason): Pt instructed how to use 3in1 in tub  Functional mobility during ADLs: Supervision/safety;Rolling walker General ADL Comments: Discussed use of walker bag/basket and to secure area rugs.  All questions answered.  Pt demonstrates good safety awareness      Vision     Perception     Praxis      Pertinent Vitals/Pain Pain Assessment: 0-10 Pain Score: 3  Pain Location: R knee Pain Descriptors / Indicators: Aching;Sore Pain Intervention(s): Monitored during session;Ice applied     Hand Dominance     Extremity/Trunk Assessment Upper Extremity Assessment Upper Extremity Assessment: Overall WFL for tasks assessed   Lower Extremity Assessment Lower Extremity Assessment: Defer to PT evaluation    Cervical / Trunk Assessment Cervical / Trunk Assessment: Normal   Communication Communication Communication: No difficulties   Cognition Arousal/Alertness: Awake/alert Behavior During Therapy: WFL for tasks assessed/performed Overall Cognitive Status: Within Functional Limits for tasks assessed                     General Comments       Exercises      Shoulder Instructions      Home Living Family/patient expects to be discharged to:: Private residence Living Arrangements: Children Available Help at Discharge: Family;Friend(s) Type of Home: Apartment Home Access: Stairs to enter Entergy CorporationEntrance Stairs-Number of Steps: 14 Entrance Stairs-Rails: Right Home Layout: One level               Home  Equipment: None   Additional Comments: Pt says she can borrow RW from friend      Prior Functioning/Environment Level of Independence: Independent                 OT Problem List: Pain;Decreased knowledge of use of DME or AE   OT Treatment/Interventions:      OT Goals(Current goals can be found in the care plan section) Acute Rehab OT Goals Patient Stated Goal: to dance and 'get my walk  back"  OT Goal Formulation: All assessment and education complete, DC therapy  OT Frequency:     Barriers to D/C:            Co-evaluation              End of Session Equipment Utilized During Treatment: Rolling walker Nurse Communication: Mobility status  Activity Tolerance: Patient tolerated treatment well Patient left: in chair;with call bell/phone within reach   Time: 1042-1105 OT Time Calculation (min): 23 min Charges:  OT General Charges $OT Visit: 1 Procedure OT Evaluation $OT Eval Low Complexity: 1 Procedure OT Treatments $Self Care/Home Management : 8-22 mins G-Codes:    Wanda Newton 03/19/2016, 12:17 PM

## 2016-03-20 DIAGNOSIS — Z96651 Presence of right artificial knee joint: Secondary | ICD-10-CM | POA: Diagnosis not present

## 2016-03-20 DIAGNOSIS — I10 Essential (primary) hypertension: Secondary | ICD-10-CM | POA: Diagnosis not present

## 2016-03-20 DIAGNOSIS — Z7982 Long term (current) use of aspirin: Secondary | ICD-10-CM | POA: Diagnosis not present

## 2016-03-20 DIAGNOSIS — Z87891 Personal history of nicotine dependence: Secondary | ICD-10-CM | POA: Diagnosis not present

## 2016-03-20 DIAGNOSIS — Z471 Aftercare following joint replacement surgery: Secondary | ICD-10-CM | POA: Diagnosis not present

## 2016-03-20 DIAGNOSIS — Z79891 Long term (current) use of opiate analgesic: Secondary | ICD-10-CM | POA: Diagnosis not present

## 2016-03-20 DIAGNOSIS — R42 Dizziness and giddiness: Secondary | ICD-10-CM | POA: Diagnosis not present

## 2016-03-20 NOTE — Discharge Summary (Signed)
Physician Discharge Summary  Patient ID: Wanda PoseyMary L Cavalieri MRN: [id number] DOB/AGE: 65/05/1950 65 y.o.  Admit date: 03/18/2016 Discharge date: 03/19/2016   Procedures:  Procedure(s) (LRB): TOTAL KNEE ARTHROPLASTY (Right)  Attending Physician:  Dr. Durene RomansMatthew Olin   Admission Diagnoses:   Right knee primary OA / pain  Discharge Diagnoses:  Principal Problem:   S/P right TKA Active Problems:   Obese  Past Medical History:  Diagnosis Date  . Allergy   . Arthritis   . Blood transfusion without reported diagnosis   . Diabetes mellitus without complication (HCC)    "pre-diabetes"- Patient states no pre-diabetes  . Hypertension   . Vertigo     HPI:    Wanda PoseyMary L Greenstein, 65 y.o. female, has a history of pain and functional disability in the right knee due to arthritis and has failed non-surgical conservative treatments for greater than 12 weeks to includeNSAID's and/or analgesics, viscosupplementation injections, use of assistive devices and activity modification.  Onset of symptoms was gradual, starting 4-5 years ago with gradually worsening course since that time. The patient noted no past surgery on the right knee(s).  Patient currently rates pain in the right knee(s) at 9 out of 10 with activity. Patient has worsening of pain with activity and weight bearing, pain that interferes with activities of daily living and pain with passive range of motion.  Patient has evidence of periarticular osteophytes and joint space narrowing by imaging studies.  There is no active infection.   Risks, benefits and expectations were discussed with the patient.  Risks including but not limited to the risk of anesthesia, blood clots, nerve damage, blood vessel damage, failure of the prosthesis, infection and up to and including death.  Patient understand the risks, benefits and expectations and wishes to proceed with surgery.  PCP: Virgilio BellingWEBER,SARAH, PA-C   Discharged Condition: good  Hospital Course:  Patient  underwent the above stated procedure on 03/18/2016. Patient tolerated the procedure well and brought to the recovery room in good condition and subsequently to the floor.  POD #1 BP: 115/56 ; Pulse: 54 ; Temp: 97.5 F (36.4 C) ; Resp: 16 Patient reports pain as mild, pain controlled. No events throughout the night. Looking forward to working with PT.  Ready to be discharged home. Dorsiflexion/plantar flexion intact, incision: dressing C/D/I, no cellulitis present and compartment soft.   LABS  Basename    HGB     10.7  HCT     31.5    Discharge Exam: General appearance: alert, cooperative and no distress Extremities: Homans sign is negative, no sign of DVT, no edema, redness or tenderness in the calves or thighs and no ulcers, gangrene or trophic changes  Disposition: Home with follow up in 2 weeks   Follow-up Information    Shelda PalLIN,Damarrion Mimbs D, MD. Schedule an appointment as soon as possible for a visit in 2 week(s).   Specialty:  Orthopedic Surgery Contact information: [address] [place] [address] [phone]        [facility] Follow up.   Specialty:  Home Health Services Why:  home health physical therapy Contact information: [address] [place] [place] [address] [phone]        [facility] Follow up.   Why:  rolling walker and 3n1 Contact information: [address] [place] [address] [phone]           Discharge Instructions    Call MD / Call 911  Complete by:  As directed    If you experience chest pain or shortness of breath, CALL 911 and be transported to the hospital emergency room.  If you develope a fever above 101 F, pus (white drainage) or increased drainage or redness at the wound, or calf pain, call your surgeon's office.   Change dressing    Complete by:  As directed    Maintain surgical dressing until follow up in the clinic. If the edges start to pull up, may reinforce  with tape. If the dressing is no longer working, may remove and cover with gauze and tape, but must keep the area dry and clean.  Call with any questions or concerns.   Constipation Prevention    Complete by:  As directed    Drink plenty of fluids.  Prune juice may be helpful.  You may use a stool softener, such as Colace (over the counter) 100 mg twice a day.  Use MiraLax (over the counter) for constipation as needed.   Diet - low sodium heart healthy    Complete by:  As directed    Discharge instructions    Complete by:  As directed    Maintain surgical dressing until follow up in the clinic. If the edges start to pull up, may reinforce with tape. If the dressing is no longer working, may remove and cover with gauze and tape, but must keep the area dry and clean.  Follow up in 2 weeks at [facility]. Call with any questions or concerns.   Increase activity slowly as tolerated    Complete by:  As directed    Weight bearing as tolerated with assist device (walker, cane, etc) as directed, use it as long as suggested by your surgeon or therapist, typically at least 4-6 weeks.   TED hose    Complete by:  As directed    Use stockings (TED hose) for 2 weeks on both leg(s).  You may remove them at night for sleeping.      Allergies as of 03/19/2016      Reactions   Red Dye Shortness Of Breath   Patient states this reaction ONLY occurred with BENADRYL TABLET   Amlodipine Swelling   Bilateral LE swelling with medication.       Medication List    TAKE these medications   aspirin 81 MG chewable tablet Chew 1 tablet (81 mg total) by mouth 2 (two) times daily. Take for 4 weeks.   CALCIUM 600 600 MG Tabs tablet Generic drug:  calcium carbonate Take 600 mg by mouth 2 (two) times daily with a meal.   celecoxib 200 MG capsule Commonly known as:  CELEBREX Take 1 capsule (200 mg total) by mouth every 12 (twelve) hours. Take with Pepcid to help avoid IG upset / ulcer.   docusate  sodium 100 MG capsule Commonly known as:  COLACE Take 1 capsule (100 mg total) by mouth 2 (two) times daily.   ferrous sulfate 325 (65 FE) MG tablet Take 1 tablet (325 mg total) by mouth 3 (three) times daily after meals.   HYDROcodone-acetaminophen 7.5-325 MG tablet Commonly known as:  NORCO Take 1-2 tablets by mouth every 4 (four) hours as needed for moderate pain.   meclizine 25 MG tablet Commonly known as:  ANTIVERT Take 1 tablet (25 mg total) by mouth 3 (three) times daily as needed for dizziness.   methocarbamol 500 MG tablet Commonly known as:  ROBAXIN Take 1 tablet (500 mg total) by  mouth every 6 (six) hours as needed for muscle spasms.   polyethylene glycol packet Commonly known as:  MIRALAX / GLYCOLAX Take 17 g by mouth 2 (two) times daily.   Potassium 99 MG Tabs Take 99 mg by mouth daily with lunch.   potassium chloride 10 MEQ tablet Commonly known as:  K-DUR Take 1 tablet (10 mEq total) by mouth daily.   Vitamin D 2000 units Caps Take 2,000 Units by mouth 2 (two) times daily.     ASK your doctor about these medications   hydrochlorothiazide 50 MG tablet Commonly known as:  HYDRODIURIL TAKE ONE TABLET BY MOUTH ONCE DAILY Ask about: Which instructions should I use?        Signed: Anastasio AuerbachMatthew S. Millissa Deese   PA-C  03/20/2016, 1:02 PM

## 2016-03-21 DIAGNOSIS — Z471 Aftercare following joint replacement surgery: Secondary | ICD-10-CM | POA: Diagnosis not present

## 2016-03-21 DIAGNOSIS — R42 Dizziness and giddiness: Secondary | ICD-10-CM | POA: Diagnosis not present

## 2016-03-21 DIAGNOSIS — I10 Essential (primary) hypertension: Secondary | ICD-10-CM | POA: Diagnosis not present

## 2016-03-21 DIAGNOSIS — Z7982 Long term (current) use of aspirin: Secondary | ICD-10-CM | POA: Diagnosis not present

## 2016-03-21 DIAGNOSIS — Z87891 Personal history of nicotine dependence: Secondary | ICD-10-CM | POA: Diagnosis not present

## 2016-03-21 DIAGNOSIS — Z79891 Long term (current) use of opiate analgesic: Secondary | ICD-10-CM | POA: Diagnosis not present

## 2016-03-21 DIAGNOSIS — Z96651 Presence of right artificial knee joint: Secondary | ICD-10-CM | POA: Diagnosis not present

## 2016-03-25 DIAGNOSIS — Z79891 Long term (current) use of opiate analgesic: Secondary | ICD-10-CM | POA: Diagnosis not present

## 2016-03-25 DIAGNOSIS — R42 Dizziness and giddiness: Secondary | ICD-10-CM | POA: Diagnosis not present

## 2016-03-25 DIAGNOSIS — Z87891 Personal history of nicotine dependence: Secondary | ICD-10-CM | POA: Diagnosis not present

## 2016-03-25 DIAGNOSIS — Z471 Aftercare following joint replacement surgery: Secondary | ICD-10-CM | POA: Diagnosis not present

## 2016-03-25 DIAGNOSIS — I10 Essential (primary) hypertension: Secondary | ICD-10-CM | POA: Diagnosis not present

## 2016-03-25 DIAGNOSIS — Z7982 Long term (current) use of aspirin: Secondary | ICD-10-CM | POA: Diagnosis not present

## 2016-03-25 DIAGNOSIS — Z96651 Presence of right artificial knee joint: Secondary | ICD-10-CM | POA: Diagnosis not present

## 2016-03-27 DIAGNOSIS — Z96651 Presence of right artificial knee joint: Secondary | ICD-10-CM | POA: Diagnosis not present

## 2016-03-27 DIAGNOSIS — I10 Essential (primary) hypertension: Secondary | ICD-10-CM | POA: Diagnosis not present

## 2016-03-27 DIAGNOSIS — Z7982 Long term (current) use of aspirin: Secondary | ICD-10-CM | POA: Diagnosis not present

## 2016-03-27 DIAGNOSIS — R42 Dizziness and giddiness: Secondary | ICD-10-CM | POA: Diagnosis not present

## 2016-03-27 DIAGNOSIS — Z471 Aftercare following joint replacement surgery: Secondary | ICD-10-CM | POA: Diagnosis not present

## 2016-03-27 DIAGNOSIS — Z87891 Personal history of nicotine dependence: Secondary | ICD-10-CM | POA: Diagnosis not present

## 2016-03-27 DIAGNOSIS — Z79891 Long term (current) use of opiate analgesic: Secondary | ICD-10-CM | POA: Diagnosis not present

## 2016-03-28 DIAGNOSIS — I10 Essential (primary) hypertension: Secondary | ICD-10-CM | POA: Diagnosis not present

## 2016-03-28 DIAGNOSIS — Z96651 Presence of right artificial knee joint: Secondary | ICD-10-CM | POA: Diagnosis not present

## 2016-03-28 DIAGNOSIS — Z471 Aftercare following joint replacement surgery: Secondary | ICD-10-CM | POA: Diagnosis not present

## 2016-03-28 DIAGNOSIS — Z7982 Long term (current) use of aspirin: Secondary | ICD-10-CM | POA: Diagnosis not present

## 2016-03-28 DIAGNOSIS — Z79891 Long term (current) use of opiate analgesic: Secondary | ICD-10-CM | POA: Diagnosis not present

## 2016-03-28 DIAGNOSIS — R42 Dizziness and giddiness: Secondary | ICD-10-CM | POA: Diagnosis not present

## 2016-03-28 DIAGNOSIS — Z87891 Personal history of nicotine dependence: Secondary | ICD-10-CM | POA: Diagnosis not present

## 2016-04-03 DIAGNOSIS — Z7409 Other reduced mobility: Secondary | ICD-10-CM | POA: Diagnosis not present

## 2016-04-05 ENCOUNTER — Encounter: Payer: Self-pay | Admitting: Physician Assistant

## 2016-04-07 DIAGNOSIS — Z7409 Other reduced mobility: Secondary | ICD-10-CM | POA: Diagnosis not present

## 2016-04-11 DIAGNOSIS — Z7409 Other reduced mobility: Secondary | ICD-10-CM | POA: Diagnosis not present

## 2016-04-14 DIAGNOSIS — Z7409 Other reduced mobility: Secondary | ICD-10-CM | POA: Diagnosis not present

## 2016-04-16 DIAGNOSIS — Z7409 Other reduced mobility: Secondary | ICD-10-CM | POA: Diagnosis not present

## 2016-04-28 DIAGNOSIS — Z7409 Other reduced mobility: Secondary | ICD-10-CM | POA: Diagnosis not present

## 2016-05-05 DIAGNOSIS — Z7409 Other reduced mobility: Secondary | ICD-10-CM | POA: Diagnosis not present

## 2016-05-07 DIAGNOSIS — Z471 Aftercare following joint replacement surgery: Secondary | ICD-10-CM | POA: Diagnosis not present

## 2016-05-07 DIAGNOSIS — Z96651 Presence of right artificial knee joint: Secondary | ICD-10-CM | POA: Diagnosis not present

## 2016-05-13 ENCOUNTER — Encounter: Payer: Self-pay | Admitting: Physician Assistant

## 2016-05-13 ENCOUNTER — Ambulatory Visit (INDEPENDENT_AMBULATORY_CARE_PROVIDER_SITE_OTHER): Payer: Medicare Other | Admitting: Physician Assistant

## 2016-05-13 VITALS — BP 138/80 | HR 88 | Temp 98.4°F | Resp 16 | Ht 65.0 in | Wt 215.0 lb

## 2016-05-13 DIAGNOSIS — H9311 Tinnitus, right ear: Secondary | ICD-10-CM

## 2016-05-13 DIAGNOSIS — R739 Hyperglycemia, unspecified: Secondary | ICD-10-CM | POA: Diagnosis not present

## 2016-05-13 DIAGNOSIS — H6123 Impacted cerumen, bilateral: Secondary | ICD-10-CM

## 2016-05-13 NOTE — Patient Instructions (Signed)
     IF you received an x-ray today, you will receive an invoice from [facility]. Please contact [facility] at [phone] with questions or concerns regarding your invoice.   IF you received labwork today, you will receive an invoice from [facility]. Please contact [facility] at [phone] with questions or concerns regarding your invoice.   Our billing staff will not be able to assist you with questions regarding bills from these companies.  You will be contacted with the lab results as soon as they are available. The fastest way to get your results is to activate your My Chart account. Instructions are located on the last page of this paperwork. If you have not heard from us regarding the results in 2 weeks, please contact this office.     

## 2016-05-13 NOTE — Progress Notes (Signed)
Wanda Newton  MRN: [id number] DOB: 05/14/1950  Subjective:  Pt presents to clinic with ear ringing in her right ear for the years.  Not related to anything that she knows of.  No problems with her left ear.  She has had something like in the past and she has cerumen impaction.  Feels great with her new knee - she has been released from medical care from her total knee.  She had elevated glucose while in the hospital.  Review of Systems  HENT: Negative for congestion, ear discharge and ear pain (random).     Patient Active Problem List   Diagnosis Date Noted  . Obese 03/19/2016  . S/P right TKA 03/18/2016  . Umbilical hernia 08/10/2015  . Elevated cholesterol 04/13/2015  . Obesity, Class II, BMI 35-39.9 04/12/2015  . Vertigo 06/15/2014  . Benign essential HTN 05/07/2014    Current Outpatient Prescriptions on File Prior to Visit  Medication Sig Dispense Refill  . calcium carbonate (CALCIUM 600) 600 MG TABS tablet Take 600 mg by mouth 2 (two) times daily with a meal.    . Cholecalciferol (VITAMIN D) 2000 units CAPS Take 2,000 Units by mouth 2 (two) times daily.    . ferrous sulfate 325 (65 FE) MG tablet Take 1 tablet (325 mg total) by mouth 3 (three) times daily after meals.  3  . hydrochlorothiazide (HYDRODIURIL) 50 MG tablet TAKE ONE TABLET BY MOUTH ONCE DAILY 90 tablet 0  . HYDROcodone-acetaminophen (NORCO) 7.5-325 MG tablet Take 1-2 tablets by mouth every 4 (four) hours as needed for moderate pain. 60 tablet 0  . meclizine (ANTIVERT) 25 MG tablet Take 1 tablet (25 mg total) by mouth 3 (three) times daily as needed for dizziness. 90 tablet 0  . Potassium 99 MG TABS Take 99 mg by mouth daily with lunch.    . potassium chloride (K-DUR) 10 MEQ tablet Take 1 tablet (10 mEq total) by mouth daily. (Patient not taking: Reported on 03/06/2016) 30 tablet 0   No current facility-administered medications on file prior to visit.     Allergies  Allergen Reactions  . Red Dye Shortness  Of Breath    Patient states this reaction ONLY occurred with BENADRYL TABLET  . Amlodipine Swelling    Bilateral LE swelling with medication.     Pt patients past, family and social history were reviewed and updated.   Objective:  BP 138/80   Pulse 88   Temp 98.4 F (36.9 C) (Oral)   Resp 16   Ht 5\' 5"  (1.651 m)   Wt 215 lb (97.5 kg)   SpO2 97%   BMI 35.78 kg/m   Physical Exam  Constitutional: She is oriented to person, place, and time and well-developed, well-nourished, and in no distress.  HENT:  Head: Normocephalic and atraumatic.  Right Ear: Hearing and external ear normal. A foreign body is present.  Left Ear: Hearing and external ear normal. A foreign body is present.  Bilateral cerumen impaction - removed with lavage - afterwards canal clear with TM normal appearance  Eyes: Conjunctivae are normal.  Neck: Normal range of motion.  Cardiovascular: Normal rate, regular rhythm and normal heart sounds.   No murmur heard. Pulmonary/Chest: Effort normal and breath sounds normal. She has no wheezes.  Neurological: She is alert and oriented to person, place, and time. Gait normal.  Skin: Skin is warm and dry.  Psychiatric: Mood, memory, affect and judgment normal.  Vitals reviewed.   Assessment and Plan :  Hyperglycemia - Plan: Basic metabolic panel, Hemoglobin A1c - check labs as her glucose was elevated in the hospital.  She is currently fasting.  Bilateral impacted cerumen - resolved at visit  Tinnitus of right ear - should resolve with cerumen impation removed - she will f/u if not resolved  Benny LennertSarah Avner Stroder PA-C  Primary Care at [facility] 05/13/2016 2:37 PM

## 2016-05-14 LAB — BASIC METABOLIC PANEL
BUN / CREAT RATIO: 25 (ref 12–28)
BUN: 15 mg/dL (ref 8–27)
CO2: 25 mmol/L (ref 18–29)
Calcium: 9.8 mg/dL (ref 8.7–10.3)
Chloride: 95 mmol/L — ABNORMAL LOW (ref 96–106)
Creatinine, Ser: 0.61 mg/dL (ref 0.57–1.00)
GFR, EST AFRICAN AMERICAN: 110 (ref >59)
GFR, EST NON AFRICAN AMERICAN: 95 (ref >59)
Glucose: 101 mg/dL — ABNORMAL HIGH (ref 65–99)
POTASSIUM: 3.1 mmol/L — AB (ref 3.5–5.2)
Sodium: 140 mmol/L (ref 134–144)

## 2016-05-14 LAB — HEMOGLOBIN A1C
Est. average glucose Bld gHb Est-mCnc: 126
HEMOGLOBIN A1C: 6 % — AB (ref 4.8–5.6)

## 2016-06-03 ENCOUNTER — Other Ambulatory Visit: Payer: Self-pay | Admitting: Physician Assistant

## 2016-06-03 NOTE — Telephone Encounter (Signed)
Patient notified via My Chart.  Meds ordered this encounter  Medications  . hydrochlorothiazide (HYDRODIURIL) 50 MG tablet    Sig: TAKE ONE TABLET BY MOUTH ONCE DAILY    Dispense:  90 tablet    Refill:  0    Please notify patient that s/he needs an office visit +/- labsfor additional refills.

## 2016-08-25 ENCOUNTER — Telehealth: Payer: Self-pay | Admitting: Physician Assistant

## 2016-08-25 NOTE — Telephone Encounter (Signed)
[place].  She needs a refill on her BP and hydrochlorothiazide medications.  [facility], [address], [place], [place] [id number].  [phone]   Her number is [phone]

## 2016-08-27 NOTE — Telephone Encounter (Signed)
This was not originally routed to clinical on the 4th

## 2016-08-28 MED ORDER — HYDROCHLOROTHIAZIDE 50 MG PO TABS: 50.0000 mg | ORAL_TABLET | Freq: Every day | ORAL | 0 refills | Status: DC

## 2016-08-28 NOTE — Telephone Encounter (Signed)
[phone]    Called 1 month to selected pharmacy

## 2016-09-01 ENCOUNTER — Telehealth: Payer: Self-pay | Admitting: Family Medicine

## 2016-09-01 NOTE — Telephone Encounter (Signed)
Wanda Newton PT WANTED YOU TO KNOW THAT SHE IS DOING WELL AND THAT SHE IS OUT OF TOWN IN [place] AND WANTED TO KNOW IF YOU NEED HER TO F/U IN THE NEAR FUTURE SO SHE CAN MAKE PLANS TO COME TO [place] TO BE SEEN

## 2016-09-02 NOTE — Telephone Encounter (Signed)
Wanda Newton, please see the patient's message

## 2016-09-02 NOTE — Telephone Encounter (Signed)
Spoke with patient and advised that Maralyn SagoSarah wants to see her back in July. Pt stated that she would be back home July and she will call and schedule an appointment when she gets back.

## 2016-09-02 NOTE — Telephone Encounter (Signed)
I need to see the patient in July.

## 2016-09-23 ENCOUNTER — Encounter: Payer: Self-pay | Admitting: Physician Assistant

## 2016-09-23 ENCOUNTER — Ambulatory Visit (INDEPENDENT_AMBULATORY_CARE_PROVIDER_SITE_OTHER): Payer: Medicare Other | Admitting: Physician Assistant

## 2016-09-23 VITALS — BP 139/79 | HR 81 | Temp 98.2°F | Resp 18 | Ht 65.0 in | Wt 210.8 lb

## 2016-09-23 DIAGNOSIS — I10 Essential (primary) hypertension: Secondary | ICD-10-CM | POA: Diagnosis not present

## 2016-09-23 DIAGNOSIS — E559 Vitamin D deficiency, unspecified: Secondary | ICD-10-CM

## 2016-09-23 DIAGNOSIS — R42 Dizziness and giddiness: Secondary | ICD-10-CM

## 2016-09-23 DIAGNOSIS — R7309 Other abnormal glucose: Secondary | ICD-10-CM

## 2016-09-23 DIAGNOSIS — E7800 Pure hypercholesterolemia, unspecified: Secondary | ICD-10-CM | POA: Diagnosis not present

## 2016-09-23 MED ORDER — HYDROCHLOROTHIAZIDE 50 MG PO TABS: 50.0000 mg | ORAL_TABLET | Freq: Every day | ORAL | 1 refills | Status: DC

## 2016-09-23 MED ORDER — MECLIZINE HCL 25 MG PO TABS: 25.0000 mg | ORAL_TABLET | Freq: Three times a day (TID) | ORAL | 0 refills | Status: AC | PRN

## 2016-09-23 NOTE — Patient Instructions (Addendum)
  We recommend that you schedule a mammogram for breast cancer screening. Typically, you do not need a referral to do this. Please contact a local imaging center to schedule your mammogram.  [facility] - [phone]    IF you received an x-ray today, you will receive an invoice from [facility]. Please contact [facility] at [phone] with questions or concerns regarding your invoice.   IF you received labwork today, you will receive an invoice from [facility]. Please contact [facility] at [phone] with questions or concerns regarding your invoice.   Our billing staff will not be able to assist you with questions regarding bills from these companies.  You will be contacted with the lab results as soon as they are available. The fastest way to get your results is to activate your My Chart account. Instructions are located on the last page of this paperwork. If you have not heard from us regarding the results in 2 weeks, please contact this office.

## 2016-09-23 NOTE — Progress Notes (Signed)
Wanda Newton  MRN: [id number] DOB: Jan 05, 1951  PCP: Mancel Bale, PA-C  Chief Complaint  Patient presents with  . Medication Refill    bp meds     Subjective:  Pt presents to clinic for medication refill.  She is doing really well.  Getting ready to get a part-time job so she can go back to [place] to [place] her brother and sister.  She is using her red yeast rice and OTC potassium.  Review of Systems  Constitutional: Negative for chills and fever.  Respiratory: Negative for shortness of breath.   Cardiovascular: Negative for chest pain, palpitations and leg swelling.  Neurological: Negative for headaches.    Patient Active Problem List   Diagnosis Date Noted  . Vitamin D deficiency 09/23/2016  . S/P right TKA 03/18/2016  . Umbilical hernia 93/26/7124  . Elevated cholesterol 04/13/2015  . Obesity, Class II, BMI 35-39.9 04/12/2015  . Vertigo 06/15/2014  . Benign essential HTN 05/07/2014    Current Outpatient Prescriptions on File Prior to Visit  Medication Sig Dispense Refill  . calcium carbonate (CALCIUM 600) 600 MG TABS tablet Take 600 mg by mouth 2 (two) times daily with a meal.    . Cholecalciferol (VITAMIN D) 2000 units CAPS Take 2,000 Units by mouth 2 (two) times daily.    . Potassium 99 MG TABS Take 99 mg by mouth daily with lunch.     No current facility-administered medications on file prior to visit.     Allergies  Allergen Reactions  . Red Dye Shortness Of Breath    Patient states this reaction ONLY occurred with BENADRYL TABLET  . Amlodipine Swelling    Bilateral LE swelling with medication.     Pt patients past, family and social history were reviewed and updated.   Objective:  BP 139/79   Pulse 81   Temp 98.2 F (36.8 C) (Oral)   Resp 18   Ht 5' 5"  (1.651 m)   Wt 210 lb 12.8 oz (95.6 kg)   SpO2 97%   BMI 35.08 kg/m   Physical Exam  Constitutional: She is oriented to person, place, and time and well-developed, well-nourished, and in no  distress.  HENT:  Head: Normocephalic and atraumatic.  Right Ear: Hearing and external ear normal.  Left Ear: Hearing and external ear normal.  Eyes: Conjunctivae are normal.  Neck: Normal range of motion.  Cardiovascular: Normal rate, regular rhythm and normal heart sounds.   No murmur heard. Pulmonary/Chest: Effort normal and breath sounds normal.  Musculoskeletal:       Right lower leg: She exhibits no edema.       Left lower leg: She exhibits no edema.  Neurological: She is alert and oriented to person, place, and time. Gait normal.  Skin: Skin is warm and dry.  Psychiatric: Mood, memory, affect and judgment normal.  Vitals reviewed.   Wt Readings from Last 3 Encounters:  09/23/16 210 lb 12.8 oz (95.6 kg)  05/13/16 215 lb (97.5 kg)  03/18/16 219 lb (99.3 kg)    Assessment and Plan :  Elevated cholesterol - Plan: CMP14+EGFR, Lipid panel  Vitamin D deficiency - Plan: VITAMIN D 25 Hydroxy (Vit-D Deficiency, Fractures)  Vertigo - Plan: meclizine (ANTIVERT) 25 MG tablet  Benign essential HTN - Plan: CMP14+EGFR, hydrochlorothiazide (HYDRODIURIL) 50 MG tablet  Elevated hemoglobin A1c - Plan: Hemoglobin A1c   Recheck in 6 months unless lab determine closer f/u is needed.  Pt should continue to stay active - since  her knee replacement she has lost some weight due to her ability to exercise now.  Continue medications.  Windell Hummingbird PA-C  Primary Care at [facility] 09/23/2016 3:55 PM

## 2016-09-24 LAB — CMP14+EGFR
ALBUMIN: 4.4 g/dL (ref 3.6–4.8)
ALK PHOS: 76 IU/L (ref 39–117)
ALT: 19 IU/L (ref 0–32)
AST: 17 IU/L (ref 0–40)
Albumin/Globulin Ratio: 1.6 (ref 1.2–2.2)
BUN / CREAT RATIO: 27 (ref 12–28)
BUN: 20 mg/dL (ref 8–27)
Bilirubin Total: 0.2 mg/dL (ref 0.0–1.2)
CALCIUM: 10.1 mg/dL (ref 8.7–10.3)
CO2: 25 mmol/L (ref 20–29)
CREATININE: 0.74 mg/dL (ref 0.57–1.00)
Chloride: 102 mmol/L (ref 96–106)
GFR calc Af Amer: 98 mL/min/{1.73_m2} (ref >59)
GFR, EST NON AFRICAN AMERICAN: 85 mL/min/{1.73_m2} (ref >59)
GLUCOSE: 100 mg/dL — AB (ref 65–99)
Globulin, Total: 2.7 g/dL (ref 1.5–4.5)
Potassium: 4 mmol/L (ref 3.5–5.2)
Sodium: 143 mmol/L (ref 134–144)
Total Protein: 7.1 g/dL (ref 6.0–8.5)

## 2016-09-24 LAB — LIPID PANEL
CHOLESTEROL TOTAL: 275 mg/dL — AB (ref 100–199)
Chol/HDL Ratio: 3.9 ratio (ref 0.0–4.4)
HDL: 71 mg/dL (ref >39)
LDL CALC: 161 mg/dL — AB (ref 0–99)
TRIGLYCERIDES: 214 mg/dL — AB (ref 0–149)
VLDL CHOLESTEROL CAL: 43 mg/dL — AB (ref 5–40)

## 2016-09-24 LAB — VITAMIN D 25 HYDROXY (VIT D DEFICIENCY, FRACTURES): Vit D, 25-Hydroxy: 20.5 ng/mL — ABNORMAL LOW (ref 30.0–100.0)

## 2016-09-24 LAB — HEMOGLOBIN A1C
Est. average glucose Bld gHb Est-mCnc: 137 mg/dL
Hgb A1c MFr Bld: 6.4 % — ABNORMAL HIGH (ref 4.8–5.6)

## 2016-09-26 ENCOUNTER — Other Ambulatory Visit: Payer: Self-pay | Admitting: Physician Assistant

## 2016-09-28 ENCOUNTER — Other Ambulatory Visit: Payer: Self-pay | Admitting: Physician Assistant

## 2016-09-29 ENCOUNTER — Telehealth: Payer: Self-pay | Admitting: Physician Assistant

## 2016-09-29 NOTE — Telephone Encounter (Signed)
Made pt aware that rx has already been called in.

## 2016-09-29 NOTE — Telephone Encounter (Signed)
Pt is [facility] [address]  Best number [phone]

## 2016-09-30 ENCOUNTER — Other Ambulatory Visit: Payer: Self-pay | Admitting: Physician Assistant

## 2016-10-01 ENCOUNTER — Other Ambulatory Visit: Payer: Self-pay | Admitting: Physician Assistant

## 2016-10-31 ENCOUNTER — Telehealth: Payer: Self-pay

## 2016-10-31 NOTE — Telephone Encounter (Signed)
Called pt to schedule Medicare Annual Wellness Visit with nurse health advisor. -nr  

## 2016-11-25 ENCOUNTER — Ambulatory Visit: Payer: Medicare Other

## 2016-12-02 ENCOUNTER — Ambulatory Visit: Payer: Medicare Other

## 2016-12-27 ENCOUNTER — Other Ambulatory Visit: Payer: Self-pay | Admitting: Physician Assistant

## 2016-12-27 DIAGNOSIS — I10 Essential (primary) hypertension: Secondary | ICD-10-CM

## 2016-12-29 ENCOUNTER — Ambulatory Visit: Payer: Medicare Other

## 2016-12-30 ENCOUNTER — Other Ambulatory Visit: Payer: Self-pay | Admitting: Physician Assistant

## 2016-12-30 ENCOUNTER — Ambulatory Visit (INDEPENDENT_AMBULATORY_CARE_PROVIDER_SITE_OTHER): Payer: Medicare Other | Admitting: Physician Assistant

## 2016-12-30 ENCOUNTER — Encounter: Payer: Self-pay | Admitting: Physician Assistant

## 2016-12-30 VITALS — BP 138/72 | HR 75 | Temp 98.7°F | Resp 17 | Ht 64.25 in | Wt 209.2 lb

## 2016-12-30 DIAGNOSIS — Z780 Asymptomatic menopausal state: Secondary | ICD-10-CM

## 2016-12-30 DIAGNOSIS — Z0000 Encounter for general adult medical examination without abnormal findings: Secondary | ICD-10-CM | POA: Diagnosis not present

## 2016-12-30 DIAGNOSIS — Z1211 Encounter for screening for malignant neoplasm of colon: Secondary | ICD-10-CM

## 2016-12-30 DIAGNOSIS — I10 Essential (primary) hypertension: Secondary | ICD-10-CM

## 2016-12-30 MED ORDER — HYDROCHLOROTHIAZIDE 50 MG PO TABS: 50.0000 mg | ORAL_TABLET | Freq: Every day | ORAL | 1 refills | Status: DC

## 2016-12-30 NOTE — Patient Instructions (Addendum)
Wanda Newton , Thank you for taking time to come for your Medicare Wellness Visit. I appreciate your ongoing commitment to your health goals. Please review the following plan we discussed and let me know if I can assist you in the future.   These are the goals we discussed: Goals    None      This is a list of the screening recommended for you and due dates:  Health Maintenance  Topic Date Due  . Colon Cancer Screening  03/25/2011  . DEXA scan (bone density measurement)  05/25/2015  . Pneumonia vaccines (1 of 2 - PCV13) 05/25/2015  . Flu Shot  10/22/2016  . Mammogram  05/28/2017  . Tetanus Vaccine  04/11/2025  .  Hepatitis C: One time screening is recommended by Center for Disease Control  (CDC) for  adults born from [birth year] through 1965.   Completed     Advance Directive Advance directives are legal documents that let you make choices ahead of time about your health care and medical treatment in case you become unable to communicate for yourself. Advance directives are a way for you to communicate your wishes to family, friends, and health care providers. This can help convey your decisions about end-of-life care if you become unable to communicate. Discussing and writing advance directives should happen over time rather than all at once. Advance directives can be changed depending on your situation and what you want, even after you have signed the advance directives. If you do not have an advance directive, some states assign family decision makers to act on your behalf based on how closely you are related to them. Each state has its own laws regarding advance directives. You may want to check with your health care provider, attorney, or state representative about the laws in your state. There are different types of advance directives, such as:  Medical power of attorney.  Living will.  Do not resuscitate (DNR) or do not attempt resuscitation (DNAR) order.  Health care proxy and  medical power of attorney A health care proxy, also called a health care agent, is a person who is appointed to make medical decisions for you in cases in which you are unable to make the decisions yourself. Generally, people choose someone they know well and trust to represent their preferences. Make sure to ask this person for an agreement to act as your proxy. A proxy may have to exercise judgment in the event of a medical decision for which your wishes are not known. A medical power of attorney is a legal document that names your health care proxy. Depending on the laws in your state, after the document is written, it may also need to be:  Signed.  Notarized.  Dated.  Copied.  Witnessed.  Incorporated into your medical record.  You may also want to appoint someone to manage your financial affairs in a situation in which you are unable to do so. This is called a durable power of attorney for finances. It is a separate legal document from the durable power of attorney for health care. You may choose the same person or someone different from your health care proxy to act as your agent in financial matters. If you do not appoint a proxy, or if there is a concern that the proxy is not acting in your best interests, a court-appointed guardian may be designated to act on your behalf. Living will A living will is a set of instructions documenting your wishes  about medical care when you cannot express them yourself. Health care providers should keep a copy of your living will in your medical record. You may want to give a copy to family members or friends. To alert caregivers in case of an emergency, you can place a card in your wallet to let them know that you have a living will and where they can find it. A living will is used if you become:  Terminally ill.  Incapacitated.  Unable to communicate or make decisions.  Items to consider in your living will include:  The use or non-use of  life-sustaining equipment, such as dialysis machines and breathing machines (ventilators).  A DNR or DNAR order, which is the instruction not to use cardiopulmonary resuscitation (CPR) if breathing or heartbeat stops.  The use or non-use of tube feeding.  Withholding of food and fluids.  Comfort (palliative) care when the goal becomes comfort rather than a cure.  Organ and tissue donation.  A living will does not give instructions for distributing your money and property if you should pass away. It is recommended that you seek the advice of a lawyer when writing a will. Decisions about taxes, beneficiaries, and asset distribution will be legally binding. This process can relieve your family and friends of any concerns surrounding disputes or questions that may come up about the distribution of your assets. DNR or DNAR A DNR or DNAR order is a request not to have CPR in the event that your heart stops beating or you stop breathing. If a DNR or DNAR order has not been made and shared, a health care provider will try to help any patient whose heart has stopped or who has stopped breathing. If you plan to have surgery, talk with your health care provider about how your DNR or DNAR order will be followed if problems occur. Summary  Advance directives are the legal documents that allow you to make choices ahead of time about your health care and medical treatment in case you become unable to communicate for yourself.  The process of discussing and writing advance directives should happen over time. You can change the advance directives, even after you have signed them.  Advance directives include DNR or DNAR orders, living wills, and designating an agent as your medical power of attorney. This information is not intended to replace advice given to you by your health care provider. Make sure you discuss any questions you have with your health care provider. Document Released: 06/17/2007 Document  Revised: 01/28/2016 Document Reviewed: 01/28/2016 Elsevier Interactive Patient Education  2017 [employer].    ________________________________________________________________  We recommend that you schedule a mammogram for breast cancer screening. Typically, you do not need a referral to do this. Please contact a local imaging center to schedule your mammogram.  [facility] - [phone]

## 2016-12-30 NOTE — Progress Notes (Signed)
Presents today for Medicare visit - welcome to medicare.  Interpreter used for this visit? 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  Depression screen [facility] 2/9 12/30/2016 09/23/2016 05/13/2016 01/31/2016 11/30/2015  Decreased Interest 0 0 0 0 0  Down, Depressed, Hopeless 0 0 0 0 0  PHQ - 2 Score 0 0 0 0 0     Functional Status Survey: Is the patient deaf or have difficulty hearing?: No Does the patient have difficulty seeing, even when wearing glasses/contacts?: No Does the patient have difficulty concentrating, remembering, or making decisions?: No Does the patient have difficulty walking or climbing stairs?: No Does the patient have difficulty dressing or bathing?: No Does the patient have difficulty doing errands alone such as visiting a doctor's office or shopping?: No   Fall Risk  12/30/2016 09/23/2016 05/13/2016 01/31/2016 11/30/2015  Falls in the past year? No No No No No      Immunization status:  Immunization History  Administered Date(s)  Administered  . Tdap 04/12/2015  Declines Flu vaccine - does not plan Declines pneumococcal vaccines.  Health Maintenance Due  Topic Date Due  . COLONOSCOPY  03/25/2011  . DEXA SCAN  05/25/2015    Patient Care Team: Larkin Ina as PCP - General (Physician Assistant)   Patient Active Problem List   Diagnosis Date Noted  . Vitamin D deficiency 09/23/2016  . S/P right TKA 03/18/2016  . Umbilical hernia 08/10/2015  . Elevated cholesterol 04/13/2015  . Obesity, Class II, BMI 35-39.9 04/12/2015  . Vertigo 06/15/2014  . Benign essential HTN 05/07/2014     Past Medical History:  Diagnosis Date  . Allergy   . Arthritis   . Blood transfusion without reported diagnosis   . Diabetes mellitus without complication (HCC)    "pre-diabetes"- Patient states no pre-diabetes  . Hypertension   . Vertigo      Past Surgical History:  Procedure Laterality Date  . ABDOMINAL HYSTERECTOMY    . FOOT SURGERY    . TONSILLECTOMY    . TOTAL KNEE ARTHROPLASTY Right 03/18/2016   Procedure: TOTAL KNEE ARTHROPLASTY;  Surgeon: Durene Romans, MD;  Location: [facility];  Service: Orthopedics;  Laterality: Right;     Family History  Problem Relation Age of Onset  . Diabetes Mother   . Hypertension Mother   . Diabetes Brother   . Hypertension Brother   . Diabetes Sister   . Mental illness Sister 78       schizophrenia  . Gout Brother      Social History   Social History  .  Marital status: Single    Spouse name: n/a  . Number of children: 3  . Years of education: associates   Occupational History  . Engineer, materials     cultural arts   Social History Main Topics  . Smoking status: Former Smoker    Quit date: 02/21/2001  . Smokeless tobacco: Never Used  . Alcohol use No  . Drug use: No  . Sexual activity: Not Currently   Other Topics Concern  . 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 Red Dye Shortness Of Breath    Patient states this reaction ONLY occurred with BENADRYL TABLET  . Amlodipine Swelling    Bilateral LE swelling with medication.     Prior to Admission medications   Medication Sig Start Date End Date Taking? Authorizing Provider  calcium carbonate (CALCIUM 600) 600 MG TABS tablet Take 600 mg by mouth 2 (two) times daily with a meal.   Yes [provider]  Cholecalciferol (VITAMIN D) 2000 units CAPS Take 2,000 Units by mouth 2 (two) times daily.   Yes [provider]  hydrochlorothiazide (HYDRODIURIL) 50 MG tablet Take 1 tablet (50 mg total) by mouth daily. 09/23/16  Yes Weber, Dema Severin, PA-C  meclizine (ANTIVERT) 25 MG tablet Take 1 tablet (25 mg total) by mouth 3 (three) times daily as needed for dizziness. 09/23/16  Yes Weber, Dema Severin, PA-C  Potassium 99 MG TABS Take 99 mg by mouth daily with lunch.   Yes [provider]  Red Yeast Rice Extract (RED YEAST RICE PO) Take by mouth.   Yes [provider]      ELECTROCARDIOGRAM  Rhythm: sinus bradycardia at a rate of 53. Findings: Last EKG: Normal  Changes from last EKG: No I have personally reviewed the EKG tracing and agree with the computerized printout.   PHYSICAL EXAM: BP 138/72 (BP Location: Left Arm, Patient Position: Sitting, Cuff Size: Large)   Pulse 75   Temp 98.7 F (37.1 C) (Oral)   Resp 17   Ht 5' 4.25" (1.632 m)   Wt 209 lb 3.2 oz (94.9 kg)   SpO2 96%   BMI 35.63 kg/m   Wt Readings from Last 3 Encounters:  12/30/16 209 lb 3.2 oz (94.9 kg)  09/23/16 210 lb 12.8 oz (95.6 kg)  05/13/16 215 lb (97.5 kg)      Visual Acuity Screening   Right eye Left eye Both eyes  Without correction:     With correction:    Physical Exam  Constitutional: She is oriented to person, place, and time. She appears well-developed and well-nourished.  HENT:  Head:  Normocephalic and atraumatic.  Right Ear: External ear normal.  Left Ear: External ear normal.  Nose: Nose normal.  Mouth/Throat: Oropharynx is clear and moist.  Eyes: Pupils are equal, round, and reactive to light. Conjunctivae and EOM are normal.  Neck: Normal range of motion. Neck supple. Carotid bruit is not present. No thyroid mass present.  Cardiovascular: Normal rate, regular rhythm and normal heart sounds.   No murmur heard. Respiratory: Effort normal and breath sounds normal. She has no wheezes.  Musculoskeletal: Normal range of motion.  Neurological: She is alert and oriented to person, place, and time.  Skin: Skin is  warm and dry.  Psychiatric: She has a normal mood and affect. Her behavior is normal. Judgment and thought content normal.   Education/Counseling: yes diet and exercise yes prevention of chronic diseases yes smoking/tobacco cessation yes review "Covered Medicare Preventive Services"    ASSESSMENT/PLAN: Welcome to Medicare preventive visit - Plan: EKG 12-Lead  Benign essential HTN - Plan: hydrochlorothiazide (HYDRODIURIL) 50 MG tablet  Post-menopausal - Plan: DG Bone Density  Screen for colon cancer - Plan: Cologuard  Benny Lennert PA-C  Primary Care at [facility] 12/30/2016 2:23 PM

## 2017-02-16 ENCOUNTER — Encounter: Payer: Self-pay | Admitting: Physician Assistant

## 2017-02-16 DIAGNOSIS — Z780 Asymptomatic menopausal state: Secondary | ICD-10-CM | POA: Diagnosis not present

## 2017-02-19 DIAGNOSIS — Z1231 Encounter for screening mammogram for malignant neoplasm of breast: Secondary | ICD-10-CM | POA: Diagnosis not present

## 2017-02-25 DIAGNOSIS — Z471 Aftercare following joint replacement surgery: Secondary | ICD-10-CM | POA: Diagnosis not present

## 2017-02-25 DIAGNOSIS — Z96651 Presence of right artificial knee joint: Secondary | ICD-10-CM | POA: Diagnosis not present

## 2017-03-18 ENCOUNTER — Telehealth: Payer: Self-pay | Admitting: Physician Assistant

## 2017-03-18 NOTE — Telephone Encounter (Signed)
Called pt to reschedule her appt with Wanda LennertSarah Newton on 03/31/17. Wanda SagoSarah has had a change in her schedule so we will need to reschedule her.  No DPR so I was not able to leave a message   When she calls back, please reschedule her for 04/07/17 or after.  Thanks!

## 2017-03-31 ENCOUNTER — Ambulatory Visit: Payer: Medicare Other | Admitting: Physician Assistant

## 2017-04-15 ENCOUNTER — Ambulatory Visit: Payer: Medicare Other | Admitting: Physician Assistant

## 2017-05-27 ENCOUNTER — Other Ambulatory Visit: Payer: Self-pay

## 2017-05-27 ENCOUNTER — Other Ambulatory Visit: Payer: Self-pay | Admitting: Physician Assistant

## 2017-05-27 DIAGNOSIS — I10 Essential (primary) hypertension: Secondary | ICD-10-CM

## 2017-05-27 MED ORDER — HYDROCHLOROTHIAZIDE 50 MG PO TABS: 50.0000 mg | ORAL_TABLET | Freq: Every day | ORAL | 0 refills | Status: DC

## 2017-05-27 NOTE — Telephone Encounter (Signed)
Approved 30 day supply for pt, pt needs an appt for follow up

## 2017-07-02 ENCOUNTER — Telehealth: Payer: Self-pay | Admitting: Physician Assistant

## 2017-07-02 DIAGNOSIS — I10 Essential (primary) hypertension: Secondary | ICD-10-CM

## 2017-07-02 MED ORDER — HYDROCHLOROTHIAZIDE 50 MG PO TABS: 50.0000 mg | ORAL_TABLET | Freq: Every day | ORAL | 0 refills | Status: DC

## 2017-07-02 NOTE — Telephone Encounter (Signed)
HCTZ refill Last OV: 09/23/16 (has upcoming appt 07/07/17) Last Refill:12/30/16 Pharmacy:[facility] [address] PCP: Benny LennertSarah Weber PA-C Filled x 15 days (partial refill)

## 2017-07-02 NOTE — Telephone Encounter (Signed)
Copied from CRM [id number]. Topic: Quick Communication - Rx Refill/Question >> Jul 02, 2017  9:47 AM Wanda Newton, Wanda Newton wrote: Medication: hydrochlorothiazide (HYDRODIURIL) 50 MG tablet  Pt has appt scheduled for 07/07/17   Has the patient contacted their pharmacy? Yes.   (Agent: If no, request that the patient contact the pharmacy for the refill.) Preferred Pharmacy (with phone number or street name): [id number] - [place], [place] - [address] Agent: Please be advised that RX refills may take up to 3 business days. We ask that you follow-up with your pharmacy.

## 2017-07-07 ENCOUNTER — Ambulatory Visit (INDEPENDENT_AMBULATORY_CARE_PROVIDER_SITE_OTHER): Payer: Medicare Other | Admitting: Physician Assistant

## 2017-07-07 ENCOUNTER — Encounter: Payer: Self-pay | Admitting: Physician Assistant

## 2017-07-07 ENCOUNTER — Other Ambulatory Visit: Payer: Self-pay

## 2017-07-07 VITALS — BP 124/70 | HR 86 | Temp 98.5°F | Resp 18 | Ht 64.25 in | Wt 219.0 lb

## 2017-07-07 DIAGNOSIS — E559 Vitamin D deficiency, unspecified: Secondary | ICD-10-CM | POA: Diagnosis not present

## 2017-07-07 DIAGNOSIS — E7800 Pure hypercholesterolemia, unspecified: Secondary | ICD-10-CM

## 2017-07-07 DIAGNOSIS — I10 Essential (primary) hypertension: Secondary | ICD-10-CM

## 2017-07-07 DIAGNOSIS — K30 Functional dyspepsia: Secondary | ICD-10-CM | POA: Diagnosis not present

## 2017-07-07 DIAGNOSIS — R7309 Other abnormal glucose: Secondary | ICD-10-CM | POA: Diagnosis not present

## 2017-07-07 MED ORDER — RANITIDINE HCL 150 MG PO TABS: 150.0000 mg | ORAL_TABLET | Freq: Two times a day (BID) | ORAL | 0 refills | Status: DC

## 2017-07-07 MED ORDER — HYDROCHLOROTHIAZIDE 50 MG PO TABS: 50.0000 mg | ORAL_TABLET | Freq: Every day | ORAL | 1 refills | Status: DC

## 2017-07-07 NOTE — Progress Notes (Signed)
Wanda Newton  MRN: [id number] DOB: [birth year]  PCP: Mancel Bale, PA-C  Chief Complaint  Patient presents with  . Medication Refill    hydrodiuril     Subjective:  Pt presents to clinic for medication refills.  She is doing well.  She stopped her Vit D because it was making her veins hurts - since she has stopped that supplement she has felt much better - it was the Vit d in olive oil capsule.  She is still using her Calcium supplements.  She is trying to exercise because she knows she has gained weight but she also knows that she does not always make good choices with eating.  She does not drink sodas and only drinks rare decaf coffee.  She does have some pain in the left upper quadrant of her abdomin that she has at this same time but it is not associated with eating - she thinks it may be worse mornings after a fatty greasy meal the night before.  History is obtained by patient.  Review of Systems  Constitutional: Negative for chills and fever.  Eyes: Negative for visual disturbance.  Respiratory: Negative for shortness of breath.   Cardiovascular: Negative for chest pain, palpitations and leg swelling.  Gastrointestinal: Positive for abdominal pain (left upper quadrant - random) and nausea (mild in the am sometimes - when she has indigestion/sour stomach). Negative for constipation, diarrhea, rectal pain and vomiting.  Neurological: Negative for dizziness, light-headedness and headaches.    Patient Active Problem List   Diagnosis Date Noted  . Vitamin D deficiency 09/23/2016  . S/P right TKA 03/18/2016  . Umbilical hernia 63/14/9702  . Elevated cholesterol 04/13/2015  . Obesity, Class II, BMI 35-39.9 04/12/2015  . Vertigo 06/15/2014  . Benign essential HTN 05/07/2014    Current Outpatient Medications on File Prior to Visit  Medication Sig Dispense Refill  . calcium carbonate (CALCIUM 600) 600 MG TABS tablet Take 600 mg by mouth 2 (two) times daily with a meal.    .  Cholecalciferol (VITAMIN D) 2000 units CAPS Take 2,000 Units by mouth 2 (two) times daily.    . meclizine (ANTIVERT) 25 MG tablet Take 1 tablet (25 mg total) by mouth 3 (three) times daily as needed for dizziness. 60 tablet 0  . Potassium 99 MG TABS Take 99 mg by mouth daily with lunch.    . Red Yeast Rice Extract (RED YEAST RICE PO) Take by mouth.     No current facility-administered medications on file prior to visit.     Allergies  Allergen Reactions  . Red Dye Shortness Of Breath    Patient states this reaction ONLY occurred with BENADRYL TABLET  . Amlodipine Swelling    Bilateral LE swelling with medication.     Past Medical History:  Diagnosis Date  . Allergy   . Arthritis   . Blood transfusion without reported diagnosis   . Diabetes mellitus without complication ([place])    "pre-diabetes"- Patient states no pre-diabetes  . Hypertension   . Vertigo    Social History   Social History Narrative   Single - divorced   Designer, fashion/clothing   Likes to [employer] 04/2015   Children 3 and 15 grandchildren   Lives alone   Social History   Tobacco Use  . Smoking status: Former Smoker    Last attempt to quit: 02/21/2001    Years since quitting: 16.3  . Smokeless tobacco: Never Used  Substance Use  Topics  . Alcohol use: No    Alcohol/week: 0.0 oz  . Drug use: No   family history includes Diabetes in her brother, mother, and sister; Gout in her brother; Hypertension in her brother and mother; Mental illness (age of onset: 54) in her sister.     Objective:  BP 124/70   Pulse 86   Temp 98.5 F (36.9 C) (Oral)   Resp 18   Ht 5' 4.25" (1.632 m)   Wt 219 lb (99.3 kg)   SpO2 97%   BMI 37.30 kg/m  Body mass index is 37.3 kg/m.  Physical Exam  Constitutional: She is oriented to person, place, and time.  HENT:  Head: Normocephalic and atraumatic.  Right Ear: Hearing and external ear normal.  Left Ear: Hearing and external ear normal.  Eyes:  Conjunctivae are normal.  Neck: Normal range of motion.  Cardiovascular: Normal rate, regular rhythm and normal heart sounds.  No murmur heard. Pulmonary/Chest: Effort normal and breath sounds normal.  Abdominal: Soft. Bowel sounds are normal. She exhibits no distension. There is no tenderness. There is no guarding.  Musculoskeletal:       Right lower leg: She exhibits no edema.       Left lower leg: She exhibits no edema.  Neurological: She is alert and oriented to person, place, and time.  Skin: Skin is warm and dry.  Psychiatric: Judgment normal.  Vitals reviewed.   Assessment and Plan :  Benign essential HTN - Plan: CMP14+EGFR, hydrochlorothiazide (HYDRODIURIL) 50 MG tablet - well controlled - continue current medications  Elevated cholesterol - Plan: Lipid panel - check labs - she uses red yeast rice and has been taking it  Elevated hemoglobin A1c - Plan: Hemoglobin A1c - check labs - at last visit this was at the border of DM- she has not really changed her diet towards better  Indigestion - Plan: ranitidine (ZANTAC) 150 MG tablet - trial to help heartburn - pt to be aware of when her pain and discomfort is related to eating and what she is eating  Vit D def - pt has not been taking her supplement due to SE - she will restart a different supplement and then we will recheck in 6 months  Windell Hummingbird PA-C  Primary Care at [facility] 07/07/2017 5:40 PM

## 2017-07-07 NOTE — Patient Instructions (Addendum)
  Start Zantac 150mg  2x/day for 6 weeks - if your symptoms (sour stomach - nausea upon waking and bitter taste in mouth) - are gone decrease to once a day in the am for 2 weeks and then you can stop   IF you received an x-ray today, you will receive an invoice from [facility]. Please contact [facility] at [phone] with questions or concerns regarding your invoice.   IF you received labwork today, you will receive an invoice from [facility]. Please contact [facility] at [phone] with questions or concerns regarding your invoice.   Our billing staff will not be able to assist you with questions regarding bills from these companies.  You will be contacted with the lab results as soon as they are available. The fastest way to get your results is to activate your My Chart account. Instructions are located on the last page of this paperwork. If you have not heard from us regarding the results in 2 weeks, please contact this office.

## 2017-07-08 LAB — CMP14+EGFR
ALT: 24 IU/L (ref 0–32)
AST: 22 IU/L (ref 0–40)
Albumin/Globulin Ratio: 1.5 (ref 1.2–2.2)
Albumin: 4.4 g/dL (ref 3.6–4.8)
Alkaline Phosphatase: 67 IU/L (ref 39–117)
BUN/Creatinine Ratio: 21 (ref 12–28)
BUN: 19 mg/dL (ref 8–27)
Bilirubin Total: 0.3 mg/dL (ref 0.0–1.2)
CO2: 25 mmol/L (ref 20–29)
Calcium: 10.1 mg/dL (ref 8.7–10.3)
Chloride: 98 mmol/L (ref 96–106)
Creatinine, Ser: 0.92 mg/dL (ref 0.57–1.00)
GFR calc Af Amer: 75 mL/min/{1.73_m2} (ref >59)
GFR calc non Af Amer: 65 mL/min/{1.73_m2} (ref >59)
Globulin, Total: 3 g/dL (ref 1.5–4.5)
Glucose: 90 mg/dL (ref 65–99)
Potassium: 3.8 mmol/L (ref 3.5–5.2)
Sodium: 140 mmol/L (ref 134–144)
Total Protein: 7.4 g/dL (ref 6.0–8.5)

## 2017-07-08 LAB — LIPID PANEL
Chol/HDL Ratio: 3.9 ratio (ref 0.0–4.4)
Cholesterol, Total: 287 mg/dL — ABNORMAL HIGH (ref 100–199)
HDL: 74 mg/dL (ref >39)
LDL Calculated: 181 mg/dL — ABNORMAL HIGH (ref 0–99)
Triglycerides: 159 mg/dL — ABNORMAL HIGH (ref 0–149)
VLDL Cholesterol Cal: 32 mg/dL (ref 5–40)

## 2017-07-08 LAB — HEMOGLOBIN A1C
Est. average glucose Bld gHb Est-mCnc: 134 mg/dL
Hgb A1c MFr Bld: 6.3 % — ABNORMAL HIGH (ref 4.8–5.6)

## 2017-09-30 ENCOUNTER — Encounter: Payer: Self-pay | Admitting: Physician Assistant

## 2017-10-03 IMAGING — MG MM SCREEN MAMMOGRAM BILATERAL
4 series · 4 of 4 positions shown · non-contrast
Comparison: None.

CLINICAL DATA: Screening.

EXAM:
DIGITAL SCREENING BILATERAL MAMMOGRAM WITH CAD

[R CC]
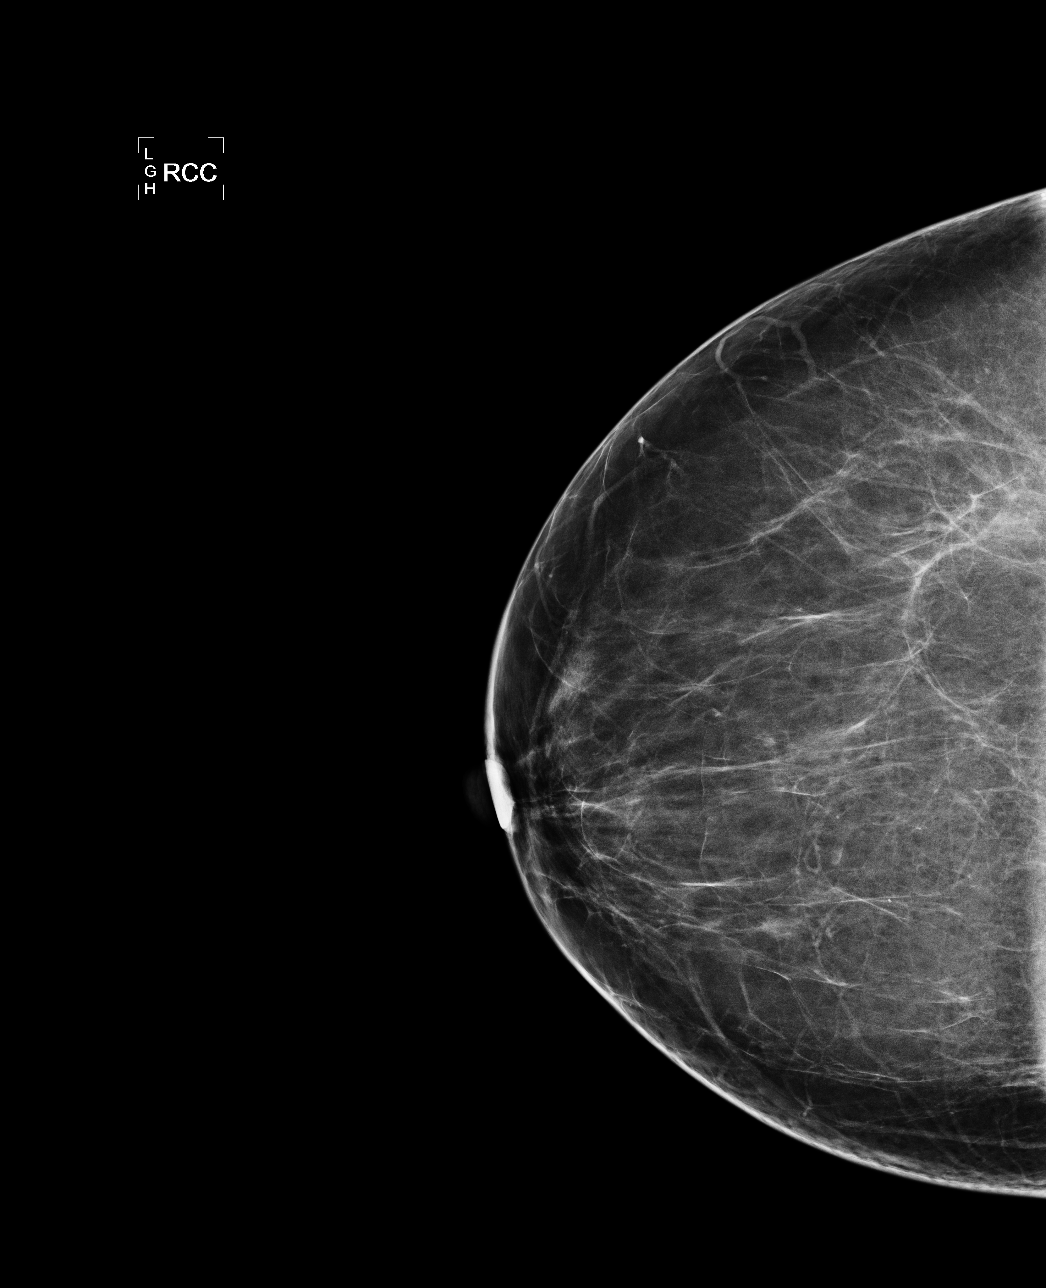

[L CC]
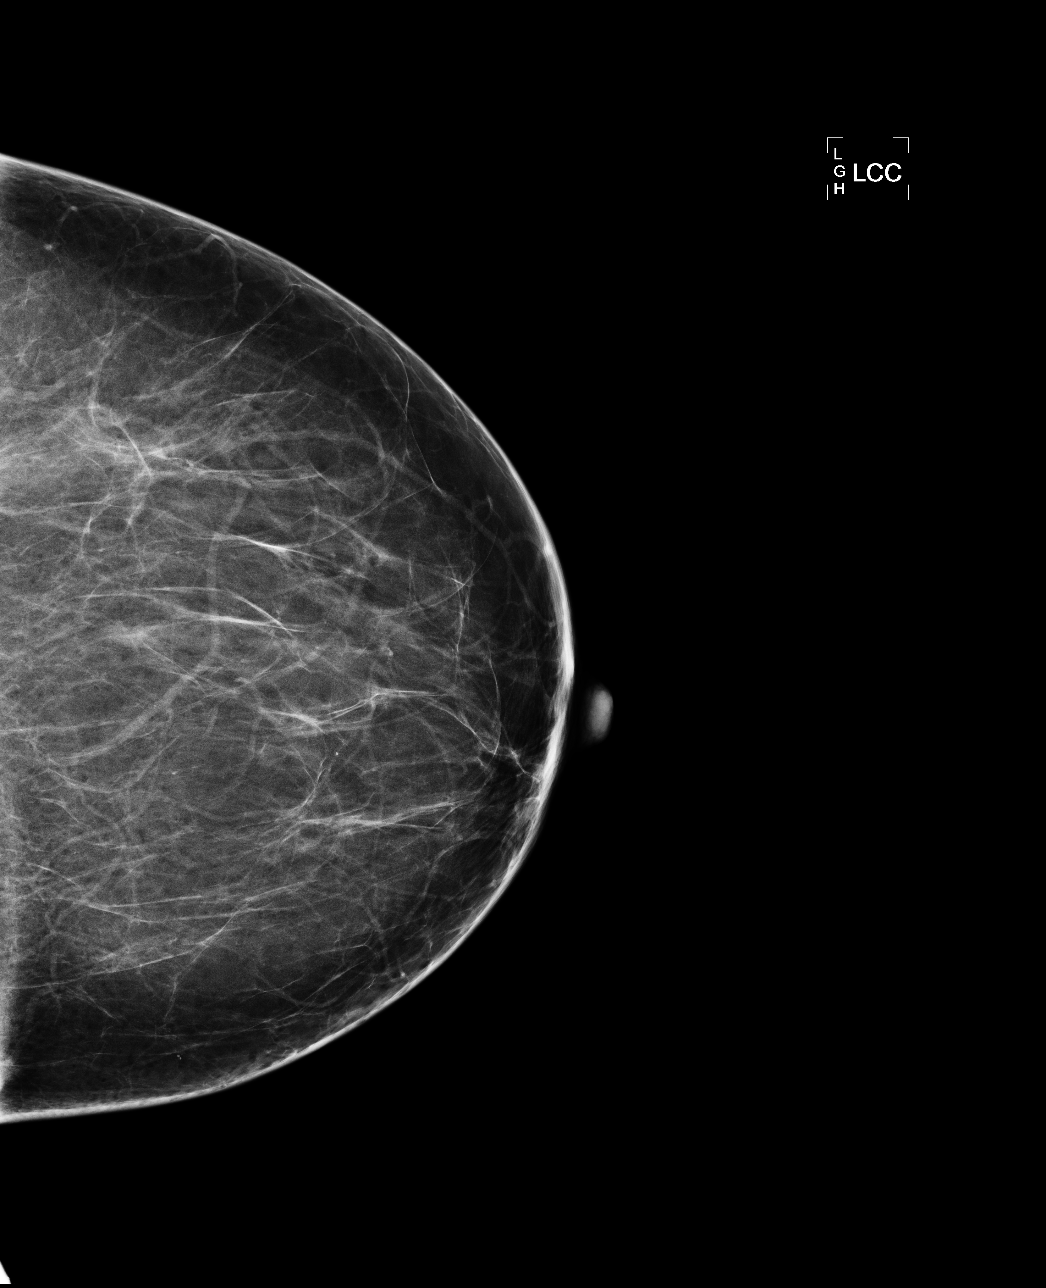

[L MLO]
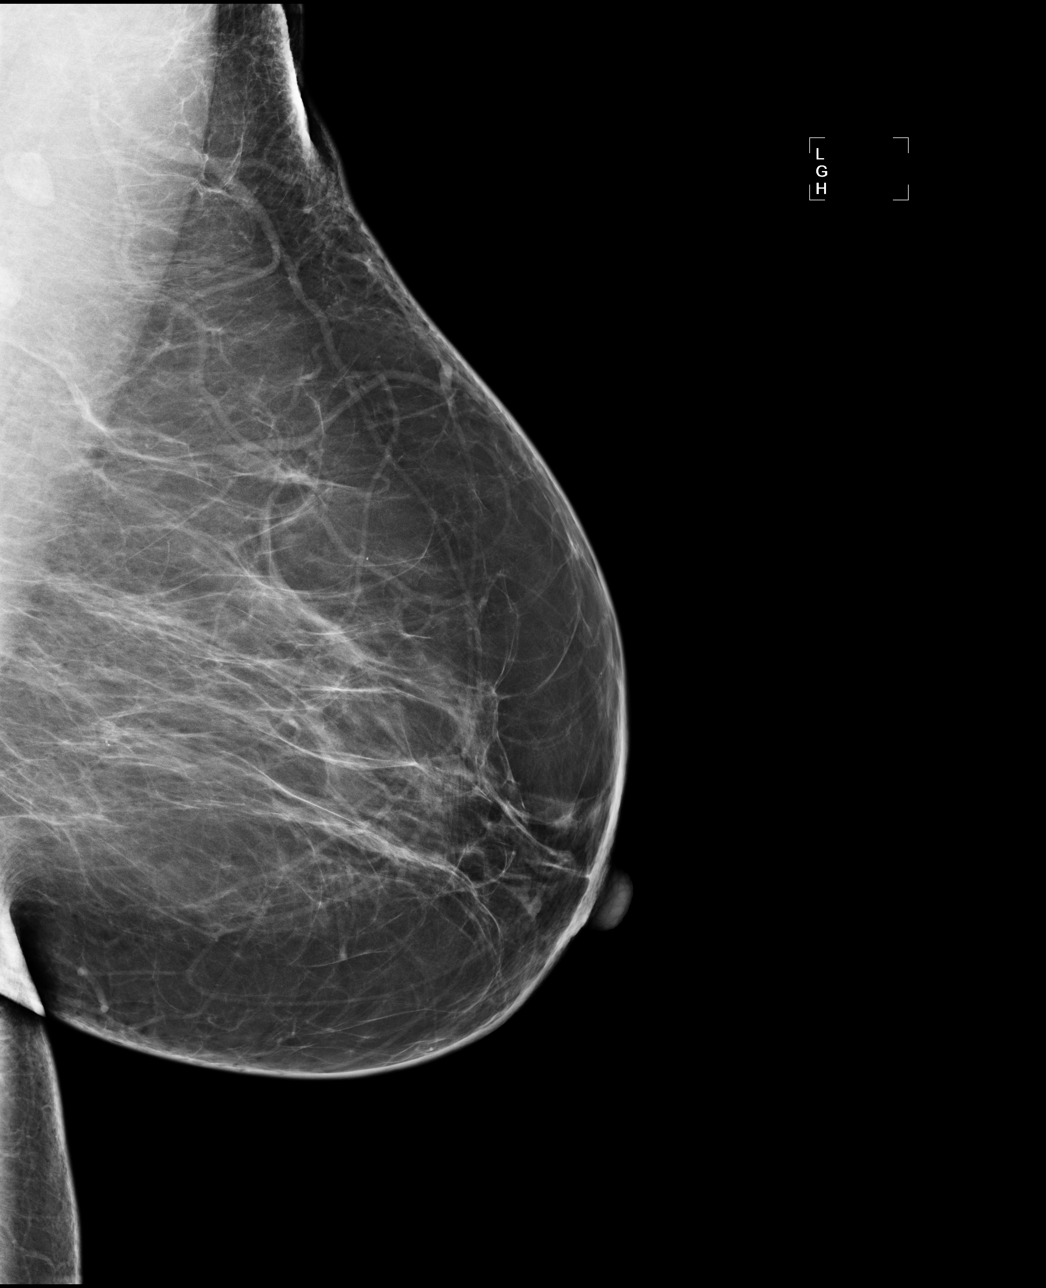

[R MLO]
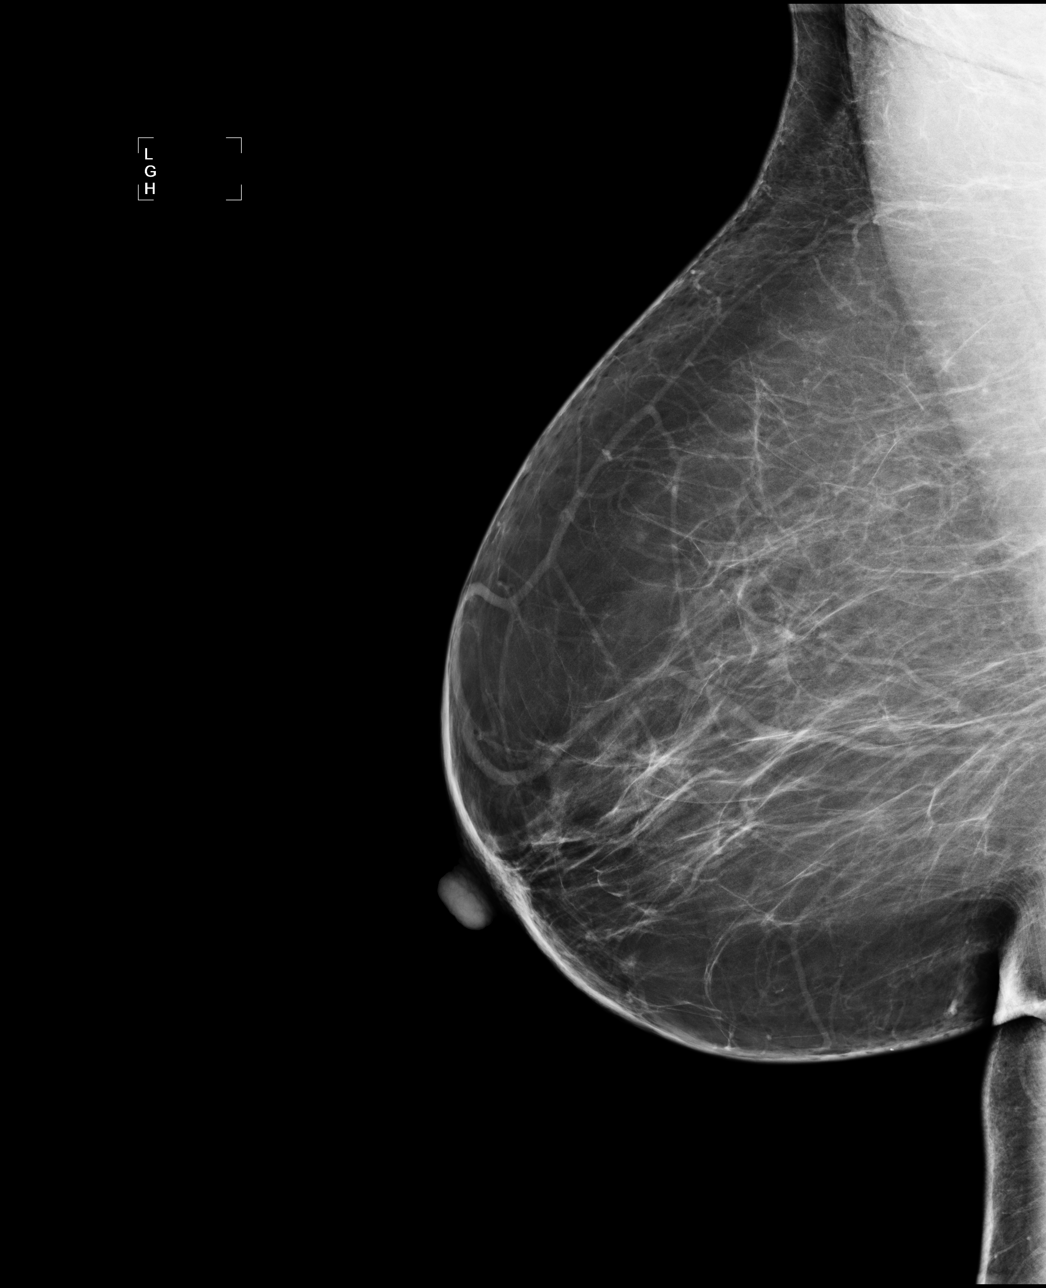

[4 of 4 positions shown; findings below may reference images not displayed]

ACR Breast Density Category b: There are scattered areas of
fibroglandular density.
FINDINGS: There are no findings suspicious for malignancy. Images were
processed with CAD.
IMPRESSION: No mammographic evidence of malignancy. A result letter of this
screening mammogram will be mailed directly to the patient.

RECOMMENDATION:
Screening mammogram in one year. (Code:[id number])

BI-RADS CATEGORY  1: Negative.

## 2017-11-20 ENCOUNTER — Encounter: Payer: Self-pay | Admitting: Physician Assistant

## 2017-11-20 ENCOUNTER — Telehealth: Payer: Self-pay | Admitting: Physician Assistant

## 2017-11-20 NOTE — Telephone Encounter (Signed)
Called patient on her cell phone as she requested - no answer - LMOM -- advised her to send me a Clinical cytogeneticistmychart message

## 2017-11-20 NOTE — Telephone Encounter (Signed)
Please see note below. 

## 2017-11-20 NOTE — Telephone Encounter (Signed)
Copied from CRM [phone]. Topic: General - Other >> Nov 20, 2017  8:19 AM Tamela OddiHarris, Brenda J wrote: Reason for CRM: Patient called to speak with Dr. Benny LennertSarah Weber regarding a personal and urgent matter.  Patient did not want to give any details.  Her call back #s [phone]

## 2017-12-08 ENCOUNTER — Other Ambulatory Visit: Payer: Self-pay | Admitting: Physician Assistant

## 2017-12-08 DIAGNOSIS — I10 Essential (primary) hypertension: Secondary | ICD-10-CM

## 2017-12-08 NOTE — Telephone Encounter (Signed)
Hydrochlorothiazide 50 mg  refill Last Refill:07/07/17 # 90 and 1 refill Last OV: 07/07/17 PCP: former pt of S, Weber Wanda Ice([name]) [phone]  Refill request too soon.

## 2017-12-15 ENCOUNTER — Telehealth: Payer: Self-pay | Admitting: Physician Assistant

## 2017-12-15 DIAGNOSIS — I10 Essential (primary) hypertension: Secondary | ICD-10-CM

## 2017-12-15 MED ORDER — HYDROCHLOROTHIAZIDE 50 MG PO TABS: 50.0000 mg | ORAL_TABLET | Freq: Every day | ORAL | 1 refills | Status: DC

## 2017-12-15 NOTE — Telephone Encounter (Signed)
Copied from CRM [id number]. Topic: Quick Communication - See Telephone Encounter >> Dec 15, 2017  3:32 PM Waymon AmatoBurton, Donna F wrote: Pt is needing a refill on hydro chlorothiazide  [facility] [address]   Best [phone]

## 2017-12-15 NOTE — Telephone Encounter (Signed)
Pt called and she wishes to [facility].  Appointment made with Dr Alvy BimlerSagardia to establish care on 01/06/18 at 10am.  Requested medication refilled.

## 2018-01-06 ENCOUNTER — Ambulatory Visit: Payer: Self-pay | Admitting: Emergency Medicine

## 2021-03-07 LAB — COLOGUARD: COLOGUARD: NEGATIVE

## 2022-09-03 ENCOUNTER — Encounter (HOSPITAL_COMMUNITY): Payer: Self-pay

## 2022-11-04 NOTE — Progress Notes (Signed)
COVID Vaccine received:  [x]  No []  Yes Date of any COVID positive Test in last 90 days: No PCP - Benny Lennert PA Cardiologist -   Chest x-ray -  EKG - 11/05/22 EPIC  Stress Test -  ECHO -  Cardiac Cath -   Bowel Prep - []  No  []   Yes ______  Pacemaker / ICD device [x]  No []  Yes   Spinal Cord Stimulator:[x]  No []  Yes       History of Sleep Apnea? []  No []  Yes   CPAP used?- [x]  No []  Yes    Does the patient monitor blood sugar?          [x]  No []  Yes  []  N/A  Patient has: []  NO Hx DM   [x]  Pre-DM                 []  DM1  []   DM2 Does patient have a [employer] or Dex[employer]om? []  No []  Yes   Fasting Blood Sugar Ranges-  Checks Blood Sugar _____ times a day  GLP1 agonist / usual dose - No GLP1 instructions:  SGLT-2 inhibitors / usual dose - No SGLT-2 instructions:   Blood Thinner / Instructions:No Aspirin Instructions:No  Comments:   Activity level: Patient is able  to climb a flight of stairs without difficulty; [x]  No CP  [x]  No SOB,   Patient can perform ADLs without assistance.   Anesthesia review:   Patient denies shortness of breath, fever, cough and chest pain at PAT appointment.  Patient verbalized understanding and agreement to the Pre-Surgical Instructions that were given to them at this PAT appointment. Patient was also educated of the need to review these PAT instructions again prior to his/her surgery.I reviewed the appropriate phone numbers to call if they have any and questions or concerns.

## 2022-11-04 NOTE — Patient Instructions (Signed)
SURGICAL WAITING ROOM VISITATION  Patients having surgery or a procedure may have no more than 2 support people in the waiting area - these visitors may rotate.    Children under the age of 85 must have an adult with them who is not the patient.  Due to an increase in RSV and influenza rates and associated hospitalizations, children ages 47 and under may not visit patients in [facility] hospitals.  If the patient needs to stay at the hospital during part of their recovery, the visitor guidelines for inpatient rooms apply. Pre-op nurse will coordinate an appropriate time for 1 support person to accompany patient in pre-op.  This support person may not rotate.    Please refer to the [facility] website for the visitor guidelines for Inpatients (after your surgery is over and you are in a regular room).       Your procedure is scheduled on: 11/18/22   Report to [facility] Main Entrance    Report to admitting at 5:15 AM   Call this number if you have problems the morning of surgery [phone]   Do not eat food :After Midnight.   After Midnight you may have the following liquids until 4:15 DAY OF SURGERY  Water Non-Citrus Juices (without pulp, NO RED-Apple, White grape, White cranberry) Black Coffee (NO MILK/CREAM OR CREAMERS, sugar ok)  Clear Tea (NO MILK/CREAM OR CREAMERS, sugar ok) regular and decaf                             Plain Jell-O (NO RED)                                           Fruit ices (not with fruit pulp, NO RED)                                     Popsicles (NO RED)                                                               Sports drinks like Gatorade (NO RED)                  The day of surgery:  Drink ONE (1) Pre-Surgery  G2 at 4:15AM the morning of surgery. Drink in one sitting. Do not sip.  This drink was given to you during your hospital  pre-op appointment visit. Nothing else to drink after completing the  Pre-SurgeryG2.     Oral  Hygiene is also important to reduce your risk of infection.                                    Remember - BRUSH YOUR TEETH THE MORNING OF SURGERY WITH YOUR REGULAR TOOTHPASTE  DENTURES WILL BE REMOVED PRIOR TO SURGERY PLEASE DO NOT APPLY "Poly grip" OR ADHESIVES!!!   Stop all vitamins and herbal supplements 7 days before surgery.   Take these medicines the morning of surgery :  none             You may not have any metal on your body including hair pins, jewelry, and body piercing             Do not wear make-up, lotions, powders, perfumes/cologne, or deodorant  Do not wear nail polish including gel and S&S, artificial/acrylic nails, or any other type of covering on natural nails including finger and toenails. If you have artificial nails, gel coating, etc. that needs to be removed by a nail salon please have this removed prior to surgery or surgery may need to be canceled/ delayed if the surgeon/ anesthesia feels like they are unable to be safely monitored.   Do not shave  48 hours prior to surgery.    Do not bring valuables to the hospital. [facility] IS NOT             RESPONSIBLE   FOR VALUABLES.   Contacts, glasses, dentures or bridgework may not be worn into surgery.   Bring small overnight bag day of surgery.   DO NOT BRING YOUR HOME MEDICATIONS TO THE HOSPITAL. PHARMACY WILL DISPENSE MEDICATIONS LISTED ON YOUR MEDICATION LIST TO YOU DURING YOUR ADMISSION IN THE HOSPITAL!    Patients discharged on the day of surgery will not be allowed to drive home.  Someone NEEDS to stay with you for the first 24 hours after anesthesia.   Special Instructions: Bring a copy of your healthcare power of attorney and living will documents the day of surgery if you haven't scanned them before.              Please read over the following fact sheets you were given: IF YOU HAVE QUESTIONS ABOUT YOUR PRE-OP INSTRUCTIONS PLEASE CALL [phone] [facility]  If you received a COVID test during your pre-op  visit  it is requested that you wear a mask when out in public, stay away from anyone that may not be feeling well and notify your surgeon if you develop symptoms. If you test positive for Covid or have been in contact with anyone that has tested positive in the last 10 days please notify you surgeon.      Pre-operative 5 CHG Bath Instructions   You can play a key role in reducing the risk of infection after surgery. Your skin needs to be as free of germs as possible. You can reduce the number of germs on your skin by washing with CHG (chlorhexidine gluconate) soap before surgery. CHG is an antiseptic soap that kills germs and continues to kill germs even after washing.   DO NOT use if you have an allergy to chlorhexidine/CHG or antibacterial soaps. If your skin becomes reddened or irritated, stop using the CHG and notify one of our RNs at [phone].   Please shower with the CHG soap starting 4 days before surgery using the following schedule:     Please keep in mind the following:  DO NOT shave, including legs and underarms, starting the day of your first shower.   You may shave your face at any point before/day of surgery.  Place clean sheets on your bed the day you start using CHG soap. Use a clean washcloth (not used since being washed) for each shower. DO NOT sleep with pets once you start using the CHG.   CHG Shower Instructions:  If you choose to wash your hair and private area, wash first with your normal shampoo/soap.  After you use shampoo/soap, rinse  your hair and body thoroughly to remove shampoo/soap residue.  Turn the water OFF and apply about 3 tablespoons (45 ml) of CHG soap to a CLEAN washcloth.  Apply CHG soap ONLY FROM YOUR NECK DOWN TO YOUR TOES (washing for 3-5 minutes)  DO NOT use CHG soap on face, private areas, open wounds, or sores.  Pay special attention to the area where your surgery is being performed.  If you are having back surgery, having someone wash  your back for you may be helpful. Wait 2 minutes after CHG soap is applied, then you may rinse off the CHG soap.  Pat dry with a clean towel  Put on clean clothes/pajamas   If you choose to wear lotion, please use ONLY the CHG-compatible lotions on the back of this paper.     Additional instructions for the day of surgery: DO NOT APPLY any lotions, deodorants, cologne, or perfumes.   Put on clean/comfortable clothes.  Brush your teeth.  Ask your nurse before applying any prescription medications to the skin.      CHG Compatible Lotions   Aveeno Moisturizing lotion  Cetaphil Moisturizing Cream  Cetaphil Moisturizing Lotion  Clairol Herbal Essence Moisturizing Lotion, Dry Skin  Clairol Herbal Essence Moisturizing Lotion, Extra Dry Skin  Clairol Herbal Essence Moisturizing Lotion, Normal Skin  Curel Age Defying Therapeutic Moisturizing Lotion with Alpha Hydroxy  Curel Extreme Care Body Lotion  Curel Soothing Hands Moisturizing Hand Lotion  Curel Therapeutic Moisturizing Cream, Fragrance-Free  Curel Therapeutic Moisturizing Lotion, Fragrance-Free  Curel Therapeutic Moisturizing Lotion, Original Formula  Eucerin Daily Replenishing Lotion  Eucerin Dry Skin Therapy Plus Alpha Hydroxy Crme  Eucerin Dry Skin Therapy Plus Alpha Hydroxy Lotion  Eucerin Original Crme  Eucerin Original Lotion  Eucerin Plus Crme Eucerin Plus Lotion  Eucerin TriLipid Replenishing Lotion  Keri Anti-Bacterial Hand Lotion  Keri Deep Conditioning Original Lotion Dry Skin Formula Softly Scented  Keri Deep Conditioning Original Lotion, Fragrance Free Sensitive Skin Formula  Keri Lotion Fast Absorbing Fragrance Free Sensitive Skin Formula  Keri Lotion Fast Absorbing Softly Scented Dry Skin Formula  Keri Original Lotion  Keri Skin Renewal Lotion Keri Silky Smooth Lotion  Keri Silky Smooth Sensitive Skin Lotion  Nivea Body Creamy Conditioning Oil  Nivea Body Extra Enriched Lotion  Nivea Body Original  Lotion  Nivea Body Sheer Moisturizing Lotion Nivea Crme  Nivea Skin Firming Lotion  NutraDerm 30 Skin Lotion  NutraDerm Skin Lotion  NutraDerm Therapeutic Skin Cream  NutraDerm Therapeutic Skin Lotion  ProShield Protective Hand Cream   Incentive Spirometer (Watch this video at home: [email or web address])  An incentive spirometer is a tool that can help keep your lungs clear and active. This tool measures how well you are filling your lungs with each breath. Taking long deep breaths may help reverse or decrease the chance of developing breathing (pulmonary) problems (especially infection) following: A long period of time when you are unable to move or be active. BEFORE THE PROCEDURE  If the spirometer includes an indicator to show your best effort, your nurse or respiratory therapist will set it to a desired goal. If possible, sit up straight or lean slightly forward. Try not to slouch. Hold the incentive spirometer in an upright position. INSTRUCTIONS FOR USE  Sit on the edge of your bed if possible, or sit up as far as you can in bed or on a chair. Hold the incentive spirometer in an upright position. Breathe out normally. Place the mouthpiece in your mouth and seal your lips  tightly around it. Breathe in slowly and as deeply as possible, raising the piston or the ball toward the top of the column. Hold your breath for 3-5 seconds or for as long as possible. Allow the piston or ball to fall to the bottom of the column. Remove the mouthpiece from your mouth and breathe out normally. Rest for a few seconds and repeat Steps 1 through 7 at least 10 times every 1-2 hours when you are awake. Take your time and take a few normal breaths between deep breaths. The spirometer may include an indicator to show your best effort. Use the indicator as a goal to work toward during each repetition. After each set of 10 deep breaths, practice coughing to be sure your lungs are  clear. If you have an incision (the cut made at the time of surgery), support your incision when coughing by placing a pillow or rolled up towels firmly against it. Once you are able to get out of bed, walk around indoors and cough well. You may stop using the incentive spirometer when instructed by your caregiver.  RISKS AND COMPLICATIONS Take your time so you do not get dizzy or light-headed. If you are in pain, you may need to take or ask for pain medication before doing incentive spirometry. It is harder to take a deep breath if you are having pain. AFTER USE Rest and breathe slowly and easily. It can be helpful to keep track of a log of your progress. Your caregiver can provide you with a simple table to help with this. If you are using the spirometer at home, follow these instructions: SEEK MEDICAL CARE IF:  You are having difficultly using the spirometer. You have trouble using the spirometer as often as instructed. Your pain medication is not giving enough relief while using the spirometer. You develop fever of 100.5 F (38.1 C) or higher. SEEK IMMEDIATE MEDICAL CARE IF:  You cough up bloody sputum that had not been present before. You develop fever of 102 F (38.9 C) or greater. You develop worsening pain at or near the incision site. MAKE SURE YOU:  Understand these instructions. Will watch your condition. Will get help right away if you are not doing well or get worse. Document Released: 07/21/2006 Document Revised: 06/02/2011 Document Reviewed: 09/21/2006 [facility] Patient Information 2014 [place], [place].

## 2022-11-05 ENCOUNTER — Encounter (HOSPITAL_COMMUNITY)
Admission: RE | Admit: 2022-11-05 | Discharge: 2022-11-05 | Disposition: A | Discharge status: Home / Self Care | Payer: 59 | Source: Ambulatory Visit | Attending: Orthopedic Surgery | Admitting: Orthopedic Surgery

## 2022-11-05 ENCOUNTER — Other Ambulatory Visit: Payer: Self-pay

## 2022-11-05 ENCOUNTER — Encounter (HOSPITAL_COMMUNITY): Payer: Self-pay

## 2022-11-05 VITALS — BP 138/93 | HR 71 | Temp 98.3°F | Resp 16 | Ht 65.0 in

## 2022-11-05 DIAGNOSIS — Z01818 Encounter for other preprocedural examination: Secondary | ICD-10-CM | POA: Diagnosis present

## 2022-11-05 DIAGNOSIS — E119 Type 2 diabetes mellitus without complications: Secondary | ICD-10-CM | POA: Insufficient documentation

## 2022-11-05 DIAGNOSIS — I10 Essential (primary) hypertension: Secondary | ICD-10-CM | POA: Insufficient documentation

## 2022-11-05 DIAGNOSIS — Z01812 Encounter for preprocedural laboratory examination: Secondary | ICD-10-CM | POA: Diagnosis not present

## 2022-11-05 HISTORY — DX: Other complications of anesthesia, initial encounter: T88.59XA

## 2022-11-05 LAB — SURGICAL PCR SCREEN
MRSA, PCR: NEGATIVE
Staphylococcus aureus: NEGATIVE

## 2022-11-18 ENCOUNTER — Ambulatory Visit (HOSPITAL_COMMUNITY): Payer: 59 | Admitting: Anesthesiology

## 2022-11-18 ENCOUNTER — Other Ambulatory Visit: Payer: Self-pay

## 2022-11-18 ENCOUNTER — Encounter (HOSPITAL_COMMUNITY): Payer: Self-pay | Admitting: Orthopedic Surgery

## 2022-11-18 ENCOUNTER — Encounter (HOSPITAL_COMMUNITY)
Admission: RE | Disposition: A | Discharge status: Home / Self Care | Payer: Self-pay | Source: Home / Self Care | Attending: Orthopedic Surgery

## 2022-11-18 ENCOUNTER — Observation Stay (HOSPITAL_COMMUNITY)
Admission: RE | Admit: 2022-11-18 | Discharge: 2022-11-19 | Disposition: A | Discharge status: Home / Self Care | Payer: 59 | Attending: Orthopedic Surgery | Admitting: Orthopedic Surgery

## 2022-11-18 ENCOUNTER — Ambulatory Visit (HOSPITAL_BASED_OUTPATIENT_CLINIC_OR_DEPARTMENT_OTHER): Payer: 59 | Admitting: Anesthesiology

## 2022-11-18 DIAGNOSIS — Z87891 Personal history of nicotine dependence: Secondary | ICD-10-CM | POA: Insufficient documentation

## 2022-11-18 DIAGNOSIS — I10 Essential (primary) hypertension: Secondary | ICD-10-CM | POA: Diagnosis not present

## 2022-11-18 DIAGNOSIS — M1712 Unilateral primary osteoarthritis, left knee: Principal | ICD-10-CM | POA: Insufficient documentation

## 2022-11-18 DIAGNOSIS — Z96652 Presence of left artificial knee joint: Principal | ICD-10-CM

## 2022-11-18 DIAGNOSIS — E119 Type 2 diabetes mellitus without complications: Secondary | ICD-10-CM | POA: Diagnosis not present

## 2022-11-18 DIAGNOSIS — Z96651 Presence of right artificial knee joint: Secondary | ICD-10-CM | POA: Insufficient documentation

## 2022-11-18 HISTORY — PX: TOTAL KNEE ARTHROPLASTY: SHX125

## 2022-11-18 LAB — GLUCOSE, CAPILLARY: Glucose-Capillary: 131 mg/dL — ABNORMAL HIGH (ref 70–99)

## 2022-11-18 SURGERY — ARTHROPLASTY, KNEE, TOTAL
Anesthesia: Monitor Anesthesia Care | Site: Knee | Laterality: Left

## 2022-11-18 MED ORDER — DOCUSATE SODIUM 100 MG PO CAPS
100.0000 mg | ORAL_CAPSULE | Freq: Two times a day (BID) | ORAL | Status: DC
  Administered 2022-11-18 – 2022-11-19 (×2): 100 mg via ORAL
  Filled 2022-11-18 (×2): qty 1

## 2022-11-18 MED ORDER — MECLIZINE HCL 25 MG PO TABS: 25.0000 mg | ORAL_TABLET | Freq: Three times a day (TID) | ORAL | Status: DC | PRN

## 2022-11-18 MED ORDER — OXYCODONE HCL 5 MG PO TABS
10.0000 mg | ORAL_TABLET | ORAL | Status: DC | PRN
  Administered 2022-11-18: 10 mg via ORAL
  Filled 2022-11-18 (×2): qty 2

## 2022-11-18 MED ORDER — CHLORHEXIDINE GLUCONATE 0.12 % MT SOLN
15.0000 mL | Freq: Once | OROMUCOSAL | Status: AC
  Administered 2022-11-18: 15 mL via OROMUCOSAL

## 2022-11-18 MED ORDER — ONDANSETRON HCL 4 MG PO TABS: 4.0000 mg | ORAL_TABLET | Freq: Four times a day (QID) | ORAL | Status: DC | PRN

## 2022-11-18 MED ORDER — DEXAMETHASONE SODIUM PHOSPHATE 10 MG/ML IJ SOLN
INTRAMUSCULAR | Status: AC
  Filled 2022-11-18: qty 1

## 2022-11-18 MED ORDER — PHENOL 1.4 % MT LIQD: 1.0000 | OROMUCOSAL | Status: DC | PRN

## 2022-11-18 MED ORDER — METOCLOPRAMIDE HCL 5 MG/ML IJ SOLN: 5.0000 mg | Freq: Three times a day (TID) | INTRAMUSCULAR | Status: DC | PRN

## 2022-11-18 MED ORDER — SODIUM CHLORIDE 0.9 % IV SOLN: INTRAVENOUS | Status: DC

## 2022-11-18 MED ORDER — BUPIVACAINE-EPINEPHRINE (PF) 0.25% -1:200000 IJ SOLN
INTRAMUSCULAR | Status: DC | PRN
  Administered 2022-11-18: 30 mL

## 2022-11-18 MED ORDER — PROPOFOL 500 MG/50ML IV EMUL
INTRAVENOUS | Status: DC | PRN
  Administered 2022-11-18: 100 ug/kg/min via INTRAVENOUS

## 2022-11-18 MED ORDER — METHOCARBAMOL 500 MG PO TABS
500.0000 mg | ORAL_TABLET | Freq: Four times a day (QID) | ORAL | Status: DC | PRN
  Administered 2022-11-18 – 2022-11-19 (×2): 500 mg via ORAL
  Filled 2022-11-18 (×2): qty 1

## 2022-11-18 MED ORDER — DIPHENHYDRAMINE HCL 12.5 MG/5ML PO ELIX: 12.5000 mg | ORAL_SOLUTION | ORAL | Status: DC | PRN

## 2022-11-18 MED ORDER — KETOROLAC TROMETHAMINE 30 MG/ML IJ SOLN
INTRAMUSCULAR | Status: AC
  Filled 2022-11-18: qty 1

## 2022-11-18 MED ORDER — DEXAMETHASONE SODIUM PHOSPHATE 10 MG/ML IJ SOLN
8.0000 mg | Freq: Once | INTRAMUSCULAR | Status: AC
  Administered 2022-11-18: 8 mg via INTRAVENOUS

## 2022-11-18 MED ORDER — ACETAMINOPHEN 500 MG PO TABS
1000.0000 mg | ORAL_TABLET | Freq: Four times a day (QID) | ORAL | Status: DC
  Administered 2022-11-18 – 2022-11-19 (×5): 1000 mg via ORAL
  Filled 2022-11-18 (×5): qty 2

## 2022-11-18 MED ORDER — HYDROMORPHONE HCL 1 MG/ML IJ SOLN: 0.5000 mg | INTRAMUSCULAR | Status: DC | PRN

## 2022-11-18 MED ORDER — PHENYLEPHRINE HCL-NACL 20-0.9 MG/250ML-% IV SOLN
INTRAVENOUS | Status: DC | PRN
  Administered 2022-11-18: 35 ug/min via INTRAVENOUS

## 2022-11-18 MED ORDER — DEXAMETHASONE SODIUM PHOSPHATE 10 MG/ML IJ SOLN
10.0000 mg | Freq: Once | INTRAMUSCULAR | Status: AC
  Administered 2022-11-19: 10 mg via INTRAVENOUS
  Filled 2022-11-18: qty 1

## 2022-11-18 MED ORDER — ORAL CARE MOUTH RINSE: 15.0000 mL | Freq: Once | OROMUCOSAL | Status: AC

## 2022-11-18 MED ORDER — ASPIRIN 81 MG PO CHEW
81.0000 mg | CHEWABLE_TABLET | Freq: Two times a day (BID) | ORAL | Status: DC
  Administered 2022-11-18 – 2022-11-19 (×2): 81 mg via ORAL
  Filled 2022-11-18 (×2): qty 1

## 2022-11-18 MED ORDER — PROPOFOL 10 MG/ML IV BOLUS
INTRAVENOUS | Status: DC | PRN
  Administered 2022-11-18: 30 mg via INTRAVENOUS

## 2022-11-18 MED ORDER — BUPIVACAINE HCL (PF) 0.5 % IJ SOLN
INTRAMUSCULAR | Status: DC | PRN
  Administered 2022-11-18: 10 mL

## 2022-11-18 MED ORDER — SODIUM CHLORIDE (PF) 0.9 % IJ SOLN
INTRAMUSCULAR | Status: AC
  Filled 2022-11-18: qty 50

## 2022-11-18 MED ORDER — PROPOFOL 500 MG/50ML IV EMUL
INTRAVENOUS | Status: AC
  Filled 2022-11-18: qty 50

## 2022-11-18 MED ORDER — SODIUM CHLORIDE (PF) 0.9 % IJ SOLN
INTRAMUSCULAR | Status: DC | PRN
  Administered 2022-11-18: 30 mL

## 2022-11-18 MED ORDER — 0.9 % SODIUM CHLORIDE (POUR BTL) OPTIME
TOPICAL | Status: DC | PRN
  Administered 2022-11-18: 1000 mL

## 2022-11-18 MED ORDER — FENTANYL CITRATE (PF) 100 MCG/2ML IJ SOLN
INTRAMUSCULAR | Status: DC | PRN
  Administered 2022-11-18 (×2): 50 ug via INTRAVENOUS

## 2022-11-18 MED ORDER — METHOCARBAMOL 500 MG IVPB - SIMPLE MED: 500.0000 mg | Freq: Four times a day (QID) | INTRAVENOUS | Status: DC | PRN

## 2022-11-18 MED ORDER — METOCLOPRAMIDE HCL 5 MG PO TABS: 5.0000 mg | ORAL_TABLET | Freq: Three times a day (TID) | ORAL | Status: DC | PRN

## 2022-11-18 MED ORDER — PROPOFOL 1000 MG/100ML IV EMUL
INTRAVENOUS | Status: AC
  Filled 2022-11-18: qty 100

## 2022-11-18 MED ORDER — ONDANSETRON HCL 4 MG/2ML IJ SOLN: 4.0000 mg | Freq: Four times a day (QID) | INTRAMUSCULAR | Status: DC | PRN

## 2022-11-18 MED ORDER — BUPIVACAINE IN DEXTROSE 0.75-8.25 % IT SOLN
INTRATHECAL | Status: DC | PRN
Start: 2022-11-18 — End: 2022-11-18
  Administered 2022-11-18: 1.8 mL via INTRATHECAL

## 2022-11-18 MED ORDER — LACTATED RINGERS IV SOLN: INTRAVENOUS | Status: DC

## 2022-11-18 MED ORDER — BUPIVACAINE LIPOSOME 1.3 % IJ SUSP
INTRAMUSCULAR | Status: DC | PRN
  Administered 2022-11-18: 10 mL

## 2022-11-18 MED ORDER — STERILE WATER FOR IRRIGATION IR SOLN
Status: DC | PRN
  Administered 2022-11-18: 1000 mL

## 2022-11-18 MED ORDER — KETOROLAC TROMETHAMINE 30 MG/ML IJ SOLN
INTRAMUSCULAR | Status: DC | PRN
  Administered 2022-11-18: 30 mg

## 2022-11-18 MED ORDER — POLYETHYLENE GLYCOL 3350 17 G PO PACK
17.0000 g | PACK | Freq: Two times a day (BID) | ORAL | Status: DC
  Administered 2022-11-18 – 2022-11-19 (×2): 17 g via ORAL
  Filled 2022-11-18 (×2): qty 1

## 2022-11-18 MED ORDER — BUPIVACAINE-EPINEPHRINE 0.25% -1:200000 IJ SOLN
INTRAMUSCULAR | Status: AC
  Filled 2022-11-18: qty 1

## 2022-11-18 MED ORDER — POTASSIUM 99 MG PO TABS: 99.0000 mg | ORAL_TABLET | Freq: Two times a day (BID) | ORAL | Status: DC

## 2022-11-18 MED ORDER — HYDROCHLOROTHIAZIDE 25 MG PO TABS
50.0000 mg | ORAL_TABLET | Freq: Every day | ORAL | Status: DC
  Administered 2022-11-19: 50 mg via ORAL
  Filled 2022-11-18: qty 2

## 2022-11-18 MED ORDER — TRANEXAMIC ACID-NACL 1000-0.7 MG/100ML-% IV SOLN
1000.0000 mg | Freq: Once | INTRAVENOUS | Status: AC
  Administered 2022-11-18: 1000 mg via INTRAVENOUS
  Filled 2022-11-18: qty 100

## 2022-11-18 MED ORDER — BISACODYL 10 MG RE SUPP: 10.0000 mg | Freq: Every day | RECTAL | Status: DC | PRN

## 2022-11-18 MED ORDER — TRANEXAMIC ACID-NACL 1000-0.7 MG/100ML-% IV SOLN
1000.0000 mg | INTRAVENOUS | Status: AC
  Administered 2022-11-18: 1000 mg via INTRAVENOUS
  Filled 2022-11-18: qty 100

## 2022-11-18 MED ORDER — ONDANSETRON HCL 4 MG/2ML IJ SOLN
INTRAMUSCULAR | Status: AC
  Filled 2022-11-18: qty 2

## 2022-11-18 MED ORDER — MENTHOL 3 MG MT LOZG: 1.0000 | LOZENGE | OROMUCOSAL | Status: DC | PRN

## 2022-11-18 MED ORDER — CEFAZOLIN SODIUM-DEXTROSE 2-4 GM/100ML-% IV SOLN
2.0000 g | INTRAVENOUS | Status: AC
  Administered 2022-11-18: 2 g via INTRAVENOUS
  Filled 2022-11-18: qty 100

## 2022-11-18 MED ORDER — POVIDONE-IODINE 10 % EX SWAB: 2.0000 | Freq: Once | CUTANEOUS | Status: DC

## 2022-11-18 MED ORDER — ONDANSETRON HCL 4 MG/2ML IJ SOLN
INTRAMUSCULAR | Status: DC | PRN
  Administered 2022-11-18: 4 mg via INTRAVENOUS

## 2022-11-18 MED ORDER — FENTANYL CITRATE (PF) 100 MCG/2ML IJ SOLN
INTRAMUSCULAR | Status: AC
  Filled 2022-11-18: qty 2

## 2022-11-18 MED ORDER — SODIUM CHLORIDE 0.9 % IR SOLN
Status: DC | PRN
  Administered 2022-11-18: 3000 mL
  Administered 2022-11-18: 1000 mL

## 2022-11-18 MED ORDER — CEFAZOLIN SODIUM-DEXTROSE 2-4 GM/100ML-% IV SOLN
2.0000 g | Freq: Four times a day (QID) | INTRAVENOUS | Status: AC
  Administered 2022-11-18 (×2): 2 g via INTRAVENOUS
  Filled 2022-11-18 (×2): qty 100

## 2022-11-18 MED ORDER — OXYCODONE HCL 5 MG PO TABS
5.0000 mg | ORAL_TABLET | ORAL | Status: DC | PRN
  Administered 2022-11-18 – 2022-11-19 (×3): 10 mg via ORAL
  Filled 2022-11-18 (×3): qty 2

## 2022-11-18 SURGICAL SUPPLY — 55 items
ADH SKN CLS APL DERMABOND .7 (GAUZE/BANDAGES/DRESSINGS) ×1
ATTUNE MED ANAT PAT 38 KNEE (Knees) IMPLANT
BAG COUNTER SPONGE SURGICOUNT (BAG) IMPLANT
BAG SPEC THK2 15X12 ZIP CLS (MISCELLANEOUS)
BAG SPNG CNTER NS LX DISP (BAG)
BAG ZIPLOCK 12X15 (MISCELLANEOUS) IMPLANT
BASEPLATE TIB CMT FB PCKT SZ4 (Stem) IMPLANT
BLADE SAW SGTL 13.0X1.19X90.0M (BLADE) ×1 IMPLANT
BNDG CMPR 6 X 5 YARDS HK CLSR (GAUZE/BANDAGES/DRESSINGS) ×1
BNDG ELASTIC 6INX 5YD STR LF (GAUZE/BANDAGES/DRESSINGS) ×1 IMPLANT
BOWL SMART MIX CTS (DISPOSABLE) ×1 IMPLANT
BSPLAT TIB 4 CMNT FX BRNG STRL (Stem) ×1 IMPLANT
CEMENT HV SMART SET (Cement) IMPLANT
COMP FEM CMT ATTUNE NRW 5 LT (Joint) ×1 IMPLANT
COMPONENT FEM CMT ATTN NRW 5LT (Joint) IMPLANT
COVER SURGICAL LIGHT HANDLE (MISCELLANEOUS) ×1 IMPLANT
CUFF TOURN SGL QUICK 34 (TOURNIQUET CUFF) ×1
CUFF TRNQT CYL 34X4.125X (TOURNIQUET CUFF) ×1 IMPLANT
DERMABOND ADVANCED .7 DNX12 (GAUZE/BANDAGES/DRESSINGS) ×1 IMPLANT
DRAPE U-SHAPE 47X51 STRL (DRAPES) ×1 IMPLANT
DRESSING AQUACEL AG SP 3.5X10 (GAUZE/BANDAGES/DRESSINGS) ×1 IMPLANT
DRSG AQUACEL AG SP 3.5X10 (GAUZE/BANDAGES/DRESSINGS) ×1
DURAPREP 26ML APPLICATOR (WOUND CARE) ×2 IMPLANT
ELECT REM PT RETURN 15FT ADLT (MISCELLANEOUS) ×1 IMPLANT
GLOVE BIO SURGEON STRL SZ 6 (GLOVE) ×1 IMPLANT
GLOVE BIOGEL PI IND STRL 6.5 (GLOVE) ×1 IMPLANT
GLOVE BIOGEL PI IND STRL 7.5 (GLOVE) ×1 IMPLANT
GLOVE ORTHO TXT STRL SZ7.5 (GLOVE) ×2 IMPLANT
GOWN STRL REUS W/ TWL LRG LVL3 (GOWN DISPOSABLE) ×2 IMPLANT
GOWN STRL REUS W/TWL LRG LVL3 (GOWN DISPOSABLE) ×2
HANDPIECE INTERPULSE COAX TIP (DISPOSABLE) ×1
HOLDER FOLEY CATH W/STRAP (MISCELLANEOUS) IMPLANT
INSERT MED ATTUNE KNEE 5 7 LT (Insert) IMPLANT
KIT TURNOVER KIT A (KITS) IMPLANT
MANIFOLD NEPTUNE II (INSTRUMENTS) ×1 IMPLANT
NDL SAFETY ECLIP 18X1.5 (MISCELLANEOUS) IMPLANT
NS IRRIG 1000ML POUR BTL (IV SOLUTION) ×1 IMPLANT
PACK TOTAL KNEE CUSTOM (KITS) ×1 IMPLANT
PIN FIX SIGMA LCS THRD HI (PIN) IMPLANT
PROTECTOR NERVE ULNAR (MISCELLANEOUS) ×1 IMPLANT
SET HNDPC FAN SPRY TIP SCT (DISPOSABLE) ×1 IMPLANT
SET PAD KNEE POSITIONER (MISCELLANEOUS) ×1 IMPLANT
SPIKE FLUID TRANSFER (MISCELLANEOUS) ×2 IMPLANT
SUT MNCRL AB 4-0 PS2 18 (SUTURE) ×1 IMPLANT
SUT STRATAFIX PDS+ 0 24IN (SUTURE) ×1 IMPLANT
SUT VIC AB 1 CT1 36 (SUTURE) ×1 IMPLANT
SUT VIC AB 2-0 CT1 27 (SUTURE) ×2
SUT VIC AB 2-0 CT1 TAPERPNT 27 (SUTURE) ×2 IMPLANT
SYR 3ML LL SCALE MARK (SYRINGE) ×1 IMPLANT
TOWEL GREEN STERILE FF (TOWEL DISPOSABLE) ×1 IMPLANT
TRAY FOLEY MTR SLVR 14FR STAT (SET/KITS/TRAYS/PACK) IMPLANT
TRAY FOLEY MTR SLVR 16FR STAT (SET/KITS/TRAYS/PACK) ×1 IMPLANT
TUBE SUCTION HIGH CAP CLEAR NV (SUCTIONS) ×1 IMPLANT
WATER STERILE IRR 1000ML POUR (IV SOLUTION) ×2 IMPLANT
WRAP KNEE MAXI GEL POST OP (GAUZE/BANDAGES/DRESSINGS) ×1 IMPLANT

## 2022-11-18 NOTE — Plan of Care (Signed)
Problem: Education: Goal: Knowledge of the prescribed therapeutic regimen will improve Outcome: Progressing   Problem: Activity: Goal: Ability to avoid complications of mobility impairment will improve Outcome: Progressing   Problem: Clinical Measurements: Goal: Postoperative complications will be avoided or minimized Outcome: Progressing   Problem: Pain Management: Goal: Pain level will decrease with appropriate interventions Outcome: Progressing   Haydee Salter, RN 11/18/22 12:10 PM

## 2022-11-18 NOTE — Anesthesia Preprocedure Evaluation (Signed)
Anesthesia Evaluation  Patient identified by MRN, date of birth, ID band Patient awake    Reviewed: Allergy & Precautions, NPO status , Patient's Chart, lab work & pertinent test results, reviewed documented beta blocker date and time   History of Anesthesia Complications (+) PROLONGED EMERGENCE and history of anesthetic complications  Airway Mallampati: III  TM Distance: >3 FB Neck ROM: Full    Dental  (+) Partial Upper, Partial Lower   Pulmonary neg shortness of breath, neg pneumonia , neg COPD, former smoker   breath sounds clear to auscultation       Cardiovascular hypertension, (-) angina (-) CAD, (-) Past MI, (-) Cardiac Stents, (-) CABG, (-) Peripheral Vascular Disease, (-) CHF and (-) DVT (-) dysrhythmias (-) Valvular Problems/Murmurs Rhythm:Regular Rate:Normal     Neuro/Psych neg Headaches, neg Seizures    GI/Hepatic ,neg GERD  ,,(+) neg Cirrhosis        Endo/Other  diabetes, Well Controlled    Renal/GU Renal disease     Musculoskeletal  (+) Arthritis ,    Abdominal   Peds  Hematology   Anesthesia Other Findings   Reproductive/Obstetrics                              Anesthesia Physical Anesthesia Plan  ASA: 2  Anesthesia Plan: MAC and Spinal   Post-op Pain Management: Regional block*   Induction: Intravenous  PONV Risk Score and Plan: 2 and Propofol infusion  Airway Management Planned:   Additional Equipment:   Intra-op Plan:   Post-operative Plan:   Informed Consent: I have reviewed the patients History and Physical, chart, labs and discussed the procedure including the risks, benefits and alternatives for the proposed anesthesia with the patient or authorized representative who has indicated his/her understanding and acceptance.     Dental advisory given  Plan Discussed with: CRNA  Anesthesia Plan Comments:          Anesthesia Quick Evaluation

## 2022-11-18 NOTE — Evaluation (Signed)
Physical Therapy Evaluation Patient Details Name: Wanda Newton MRN: [id number] DOB: [birth year] Today's Date: 11/18/2022  History of Present Illness  72 yo female presents to therapy s/p L TKA on 11/18/2022 due to failure of conservative measures. Pt PMH includes but is not limited to: R TKA (2017), hernia, vertigo, HTN, and DM II.  Clinical Impression     Wanda Newton is a 72 y.o. female POD 0 s/p L TKA. Patient reports IND with mobility at baseline. Patient is now limited by functional impairments (see PT problem list below) and requires min A for bed mobility and CGA, cues and RW  for transfers. Patient was unable to safely ambulate at time of eval due to slow regression of anesthesia impacting L LE stability with weight acceptance required for gait tasks. Patient instructed in exercise to facilitate ROM and circulation to manage edema. Patient will benefit from continued skilled PT interventions to address impairments and progress towards PLOF. Acute PT will follow to progress mobility and stair training in preparation for safe discharge home with family and strong social support.       If plan is discharge home, recommend the following: A little help with walking and/or transfers;A little help with bathing/dressing/bathroom;Assistance with cooking/housework;Assist for transportation;Help with stairs or ramp for entrance   Can travel by private vehicle        Equipment Recommendations Rolling walker (2 wheels)  Recommendations for Other Services       Functional Status Assessment Patient has had a recent decline in their functional status and demonstrates the ability to make significant improvements in function in a reasonable and predictable amount of time.     Precautions / Restrictions Precautions Precautions: Knee;Fall Restrictions Weight Bearing Restrictions: No      Mobility  Bed Mobility Overal bed mobility: Needs Assistance Bed Mobility: Supine to Sit     Supine to  sit: HOB elevated, Used rails, Min assist     General bed mobility comments: min cues and increased time min A for LLE to EOB    Transfers Overall transfer level: Needs assistance Equipment used: Rolling walker (2 wheels) Transfers: Sit to/from Stand, Bed to chair/wheelchair/BSC Sit to Stand: Contact guard assist, From elevated surface Stand pivot transfers: Contact guard assist, From elevated surface         General transfer comment: due to slow regression of anesthesia pt exhibiting limited L quad motor control coordination and strength, pt is able to engage gultes, pt indicated LLE instabilty in standing and pre gait tasks and noted with inabiltiy to perform SLR, pt able to offload LLE with B UE support at RW to complete SPT to recliner. pt required CGA and cues for safety    Ambulation/Gait               General Gait Details: NT due to slow regression of anesthesia  Stairs            Wheelchair Mobility     Tilt Bed    Modified Rankin (Stroke Patients Only)       Balance Overall balance assessment: Needs assistance Sitting-balance support: Feet supported Sitting balance-Leahy Scale: Good     Standing balance support: Bilateral upper extremity supported, During functional activity, Reliant on assistive device for balance Standing balance-Leahy Scale: Poor                               Pertinent Vitals/Pain Pain Assessment Pain Assessment:  0-10 Pain Score: 5  Pain Location: L knee Pain Descriptors / Indicators: Discomfort, Grimacing, Operative site guarding Pain Intervention(s): Limited activity within patient's tolerance, Monitored during session, Premedicated before session, Repositioned, Ice applied (pt had declined pain medication once on 3rd floor due to minimal pain prior to PT eval and requested mm relaxer)    Home Living Family/patient expects to be discharged to:: Private residence Living Arrangements: Alone Available Help at  Discharge: Family;Friend(s) (sister will be staying with pt for a few days, strong social support) Type of Home: House Home Access: Stairs to enter Entrance Stairs-Rails: None Entrance Stairs-Number of Steps: 3   Home Layout: One level Home Equipment: None      Prior Function Prior Level of Function : Independent/Modified Independent;Driving             Mobility Comments: IND with all ADLs, self care tasks, driving and working       Extremity/Trunk Assessment        Lower Extremity Assessment Lower Extremity Assessment: LLE deficits/detail LLE Deficits / Details: ankle DF/PF 5/5; PROM for SLR LLE Sensation: decreased light touch (reports abn sensation heel of L foot)    Cervical / Trunk Assessment Cervical / Trunk Assessment: Normal  Communication   Communication Communication: No apparent difficulties  Cognition Arousal: Alert Behavior During Therapy: WFL for tasks assessed/performed Overall Cognitive Status: Within Functional Limits for tasks assessed                                          General Comments      Exercises Total Joint Exercises Ankle Circles/Pumps: AROM, Both, 20 reps   Assessment/Plan    PT Assessment Patient needs continued PT services  PT Problem List Decreased strength;Decreased range of motion;Decreased activity tolerance;Decreased balance;Decreased mobility;Pain       PT Treatment Interventions DME instruction;Gait training;Stair training;Functional mobility training;Therapeutic activities;Therapeutic exercise;Balance training;Neuromuscular re-education;Patient/family education;Modalities    PT Goals (Current goals can be found in the Care Plan section)  Acute Rehab PT Goals Patient Stated Goal: to get back to doing things, 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 PT Goal Formulation: With patient Time For Goal Achievement: 12/02/22 Potential to Achieve Goals: Good    Frequency  7X/week     Co-evaluation               AM-PAC PT "6 Clicks" Mobility  Outcome Measure Help needed turning from your back to your side while in a flat bed without using bedrails?: A Little Help needed moving from lying on your back to sitting on the side of a flat bed without using bedrails?: A Little Help needed moving to and from a bed to a chair (including a wheelchair)?: A Little Help needed standing up from a chair using your arms (e.g., wheelchair or bedside chair)?: A Little Help needed to walk in hospital room?: Total Help needed climbing 3-5 steps with a railing? : Total 6 Click Score: 14    End of Session Equipment Utilized During Treatment: Gait belt Activity Tolerance: Treatment limited secondary to medical complications (Comment) (slow regression of anethesia) Patient left: in chair;with call bell/phone within reach;with family/visitor present Nurse Communication: Mobility status PT Visit Diagnosis: Unsteadiness on feet (R26.81);Muscle weakness (generalized) (M62.81);Difficulty in walking, not elsewhere classified (R26.2);Pain Pain - Right/Left: Left Pain - part of body: Knee;Leg    Time: 8469-6295  PT Time Calculation (min) (ACUTE ONLY): 19 min   Charges:   PT Evaluation $PT Eval Low Complexity: 1 Low   PT General Charges $$ ACUTE PT VISIT: 1 Visit         Johnny Bridge, PT Acute Rehab   Jacqualyn Posey 11/18/2022, 4:15 PM

## 2022-11-18 NOTE — Anesthesia Procedure Notes (Signed)
Anesthesia Regional Block: Adductor canal block   Pre-Anesthetic Checklist: , timeout performed,  Correct Patient, Correct Site, Correct Laterality,  Correct Procedure, Correct Position, site marked,  Risks and benefits discussed,  Surgical consent,  Pre-op evaluation,  At surgeon's request and post-op pain management  Laterality: Left  Prep: Maximum Sterile Barrier Precautions used, chloraprep       Needles:  Injection technique: Single-shot  Needle Type: Echogenic Needle      Needle Gauge: 20     Additional Needles:   Procedures:,,,, ultrasound used (permanent image in chart),,    Narrative:  Start time: 11/18/2022 6:45 AM End time: 11/18/2022 6:50 AM Injection made incrementally with aspirations every 5 mL.  Performed by: Personally  Anesthesiologist: Mariann Barter, MD

## 2022-11-18 NOTE — Transfer of Care (Signed)
Immediate Anesthesia Transfer of Care Note  Patient: Wanda Newton  Procedure(s) Performed: LEFT TOTAL KNEE ARTHROPLASTY (Left: Knee)  Patient Location: PACU  Anesthesia Type:Spinal  Level of Consciousness: awake, alert , and oriented  Airway & Oxygen Therapy: Patient Spontanous Breathing and Patient connected to face mask oxygen  Post-op Assessment: Report given to RN and Post -op Vital signs reviewed and stable  Post vital signs: Reviewed and stable  Last Vitals:  Vitals Value Taken Time  BP 127/51 11/18/22 0857  Temp    Pulse 84 11/18/22 0858  Resp    SpO2 95 % 11/18/22 0858  Vitals shown include unfiled device data.  Last Pain:  Vitals:   11/18/22 0557  TempSrc: Oral  PainSc:          Complications: No notable events documented.

## 2022-11-18 NOTE — Op Note (Signed)
NAME:  Wanda Newton                      MEDICAL RECORD NO.:  [id number]                             FACILITY:  [facility]      PHYSICIAN:  Madlyn Frankel. Charlann Boxer, M.D.  DATE OF BIRTH:  June 10, 1950      DATE OF PROCEDURE:  11/18/2022                                     OPERATIVE REPORT         PREOPERATIVE DIAGNOSIS:  Left knee osteoarthritis.      POSTOPERATIVE DIAGNOSIS:  Left knee osteoarthritis.      FINDINGS:  The patient was noted to have complete loss of cartilage and   bone-on-bone arthritis with associated osteophytes in the medial and patellofemoral compartments of   the knee with a significant synovitis and associated effusion.  The patient had failed months of conservative treatment including medications, injection therapy, activity modification.     PROCEDURE:  Left total knee replacement.      COMPONENTS USED:  DePuy Attune FB CR MS knee   system, a size 5N femur, 4 tibia, size 7 mm CR MS AOX insert, and 38 anatomic patellar   button.      SURGEON:  Madlyn Frankel. Charlann Boxer, M.D.      ASSISTANT:  Rosalene Billings, PA-C.      ANESTHESIA:  Regional and Spinal.      SPECIMENS:  None.      COMPLICATION:  None.      DRAINS:  None.  EBL: <200 cc      TOURNIQUET TIME:   Total Tourniquet Time Documented: Thigh (Left) - 29 minutes Total: Thigh (Left) - 29 minutes  .      The patient was stable to the recovery room.      INDICATION FOR PROCEDURE:  Wanda Newton is a 72 y.o. female patient of   mine.  The patient had been seen, evaluated, and treated for months conservatively in the   office with medication, activity modification, and injections.  The patient had   radiographic changes of bone-on-bone arthritis with endplate sclerosis and osteophytes noted.  Based on the radiographic changes and failed conservative measures, the patient   decided to proceed with definitive treatment, total knee replacement.  Risks of infection, DVT, component failure, need for revision surgery,  neurovascular injury were reviewed in the office setting.  The postop course was reviewed stressing the efforts to maximize post-operative satisfaction and function.  Consent was obtained for benefit of pain   relief.      PROCEDURE IN DETAIL:  The patient was brought to the operative theater.   Once adequate anesthesia, preoperative antibiotics, 2 gm of Ancef,1 gm of Tranexamic Acid, and 10 mg of Decadron administered, the patient was positioned supine with a left thigh tourniquet placed.  The  left lower extremity was prepped and draped in sterile fashion.  A time-   out was performed identifying the patient, planned procedure, and the appropriate extremity.      The left lower extremity was placed in the [facility] leg holder.  The leg was   exsanguinated, tourniquet elevated to 225 mmHg.  A midline incision was  made followed by median parapatellar arthrotomy.  Following initial   exposure, attention was first directed to the patella.  Precut   measurement was noted to be 22 mm.  I resected down to 12-13 mm and used a   38 anatomic patellar button to restore patellar height as well as cover the cut surface.      The lug holes were drilled and a metal shim was placed to protect the   patella from retractors and saw blade during the procedure.      At this point, attention was now directed to the femur.  The femoral   canal was opened with a drill, irrigated to try to prevent fat emboli.  An   intramedullary rod was passed at 3 degrees valgus, 9 mm of bone was   resected off the distal femur.  Following this resection, the tibia was   subluxated anteriorly.  Using the extramedullary guide, 2 mm of bone was resected off   the proximal medial tibia.  We confirmed the gap would be   stable medially and laterally with a size 6 spacer block as well as confirmed that the tibial cut was perpendicular in the coronal plane, checking with an alignment rod.      Once this was done, I sized the femur to  be a size 5 in the anterior-   posterior dimension, chose a narrow component based on medial and   lateral dimension.  The size 5 rotation block was then pinned in   position anterior referenced using the C-clamp to set rotation.  The   anterior, posterior, and  chamfer cuts were made without difficulty nor   notching making certain that I was along the anterior cortex to help   with flexion gap stability.      The final shim cut was made off the lateral aspect of distal femur.      At this point, the tibia was sized to be a size 4.  The size 4 tray was   then pinned in position through the medial third of the tubercle,   drilled, and keel punched.  Trial reduction was now carried with a 5 femur,  4 tibia, a size 7 mm CR MS insert, and the 38 anatomic patella botton.  The knee was brought to full extension with good flexion stability with the patella   tracking through the trochlea without application of pressure.  Given   all these findings the trial components removed.  Final components were   opened and cement was mixed.  The knee was irrigated with normal saline solution and pulse lavage.  The synovial lining was   then injected with 30 cc of 0.25% Marcaine with epinephrine, 1 cc of Toradol and 30 cc of NS for a total of 61 cc.     Final implants were then cemented onto cleaned and dried cut surfaces of bone with the knee brought to extension with a size 7 mm CR MS trial insert.      Once the cement had fully cured, excess cement was removed   throughout the knee.  I confirmed that I was satisfied with the range of   motion and stability, and the final size 7 mm CR MS AOX insert was chosen.  It was   placed into the knee.      The tourniquet had been let down at 29 minutes.  No significant   hemostasis was required.  The extensor mechanism was then reapproximated  using #1 Vicryl and #1 Stratafix sutures with the knee   in flexion.  The   remaining wound was closed with 2-0 Vicryl and  running 4-0 Monocryl.   The knee was cleaned, dried, dressed sterilely using Dermabond and   Aquacel dressing.  The patient was then   brought to recovery room in stable condition, tolerating the procedure   well.   Please note that Physician Assistant, Rosalene Billings, PA-C was present for the entirety of the case, and was utilized for pre-operative positioning, peri-operative retractor management, general facilitation of the procedure and for primary wound closure at the end of the case.              Madlyn Frankel Charlann Boxer, M.D.    11/18/2022 8:34 AM

## 2022-11-18 NOTE — Anesthesia Procedure Notes (Signed)
Spinal  Patient location during procedure: OR Start time: 11/18/2022 7:22 AM End time: 11/18/2022 7:25 AM Reason for block: surgical anesthesia Staffing Performed: anesthesiologist  Anesthesiologist: Mariann Barter, MD Performed by: Mariann Barter, MD Authorized by: Mariann Barter, MD   Preanesthetic Checklist Completed: patient identified, IV checked, site marked, risks and benefits discussed, surgical consent, monitors and equipment checked, pre-op evaluation and timeout performed Spinal Block Patient position: sitting Prep: DuraPrep Patient monitoring: heart rate, cardiac monitor, continuous pulse ox and blood pressure Approach: midline Location: L3-4 Injection technique: single-shot Needle Needle type: Sprotte  Needle gauge: 24 G Needle length: 9 cm Assessment Sensory level: T4 Events: CSF return

## 2022-11-18 NOTE — Discharge Instructions (Signed)

## 2022-11-18 NOTE — Anesthesia Procedure Notes (Signed)
Procedure Name: MAC Date/Time: 11/18/2022 7:19 AM  Performed by: Orest Dikes, CRNAPre-anesthesia Checklist: Patient identified, Emergency Drugs available, Suction available and Patient being monitored Oxygen Delivery Method: Simple face mask

## 2022-11-18 NOTE — Anesthesia Postprocedure Evaluation (Signed)
Anesthesia Post Note  Patient: Wanda Newton  Procedure(s) Performed: LEFT TOTAL KNEE ARTHROPLASTY (Left: Knee)     Patient location during evaluation: PACU Anesthesia Type: MAC and Spinal Level of consciousness: oriented and awake and alert Pain management: pain level controlled Vital Signs Assessment: post-procedure vital signs reviewed and stable Respiratory status: spontaneous breathing, respiratory function stable and patient connected to nasal cannula oxygen Cardiovascular status: blood pressure returned to baseline and stable Postop Assessment: no headache, no backache and no apparent nausea or vomiting Anesthetic complications: no   No notable events documented.  Last Vitals:  Vitals:   11/18/22 1015 11/18/22 1021  BP: 126/68 138/81  Pulse: (!) 56 (!) 53  Resp: 20 18  Temp: 36.7 C 36.6 C  SpO2: 98% 99%    Last Pain:  Vitals:   11/18/22 1021  TempSrc: Oral  PainSc:                  Mariann Barter

## 2022-11-18 NOTE — H&P (Signed)
TOTAL KNEE ADMISSION H&P  Patient is being admitted for left total knee arthroplasty.   No pre-op visit with Morrie Sheldon  Subjective:  Chief Complaint:left knee pain.  HPI: Wanda Newton, 72 y.o. female, has a history of pain and functional disability in the left knee due to arthritis and has failed non-surgical conservative treatments for greater than 12 weeks to includeNSAID's and/or analgesics, corticosteriod injections, viscosupplementation injections, and activity modification.  Onset of symptoms was gradual, starting 3 years ago with gradually worsening course since that time. The patient noted no past surgery on the left knee(s).  Patient currently rates pain in the left knee(s) at 8 out of 10 with activity. Patient has worsening of pain with activity and weight bearing and pain that interferes with activities of daily living.  Patient has evidence of joint space narrowing by imaging studies.  There is no active infection.  Patient Active Problem List   Diagnosis Date Noted   Vitamin D deficiency 09/23/2016   S/P right TKA 03/18/2016   Umbilical hernia 08/10/2015   Elevated cholesterol 04/13/2015   Obesity, Class II, BMI 35-39.9 04/12/2015   Vertigo 06/15/2014   Benign essential HTN 05/07/2014   Past Medical History:  Diagnosis Date   Allergy    Arthritis    Blood transfusion without reported diagnosis    Complication of anesthesia    Hard time waking up.   Diabetes mellitus without complication (HCC)    "pre-diabetes"- Patient states no pre-diabetes   Hypertension    Vertigo     Past Surgical History:  Procedure Laterality Date   ABDOMINAL HYSTERECTOMY     FOOT SURGERY     TONSILLECTOMY     TOTAL KNEE ARTHROPLASTY Right 03/18/2016   Procedure: TOTAL KNEE ARTHROPLASTY;  Surgeon: Durene Romans, MD;  Location: [facility];  Service: Orthopedics;  Laterality: Right;    Current Facility-Administered Medications  Medication Dose Route Frequency Provider Last Rate Last Admin    ceFAZolin (ANCEF) IVPB 2g/100 mL premix  2 g Intravenous On Call to OR Cassandria Anger, PA-C       dexamethasone (DECADRON) injection 8 mg  8 mg Intravenous Once Cassandria Anger, PA-C       lactated ringers infusion   Intravenous Continuous Cassandria Anger, PA-C       lactated ringers infusion   Intravenous Continuous Trevor Iha, MD 10 mL/hr at 11/18/22 1610 Continued from Pre-op at 11/18/22 0620   povidone-iodine 10 % swab 2 Application  2 Application Topical Once Cassandria Anger, PA-C       tranexamic acid (CYKLOKAPRON) IVPB 1,000 mg  1,000 mg Intravenous To OR Cassandria Anger, PA-C       Facility-Administered Medications Ordered in Other Encounters  Medication Dose Route Frequency Provider Last Rate Last Admin   fentaNYL (SUBLIMAZE) injection   Intravenous Anesthesia Intra-op Orest Dikes, CRNA   50 mcg at 11/18/22 9604   Allergies  Allergen Reactions   Red Dye #40 (Allura Red) Shortness Of Breath    Patient states this reaction ONLY occurred with BENADRYL TABLET   Amlodipine Swelling    Bilateral LE swelling with medication.     Social History   Tobacco Use   Smoking status: Former    Current packs/day: 0.00    Types: Cigarettes    Quit date: 02/21/2001    Years since quitting: 21.7   Smokeless tobacco: Never  Substance Use Topics   Alcohol use: No    Alcohol/week: 0.0 standard drinks  of alcohol    Family History  Problem Relation Age of Onset   Diabetes Mother    Hypertension Mother    Diabetes Brother    Hypertension Brother    Diabetes Sister    Mental illness Sister 57       schizophrenia   Gout Brother      Review of Systems  Constitutional:  Negative for chills and fever.  Respiratory:  Negative for cough and shortness of breath.   Cardiovascular:  Negative for chest pain.  Gastrointestinal:  Negative for nausea and vomiting.  Musculoskeletal:  Positive for arthralgias.     Objective:  Physical Exam Left knee exam: No palpable  effusion, warmth erythema Tenderness at the joint lines Slight flexion contracture Flexion over 110 degrees with tightness and crepitation.  Vital signs in last 24 hours: Temp:  [98.5 F (36.9 C)] 98.5 F (36.9 C) (08/27 0557) Pulse Rate:  [77] 77 (08/27 0557) Resp:  [19] 19 (08/27 0557) BP: (158)/(77) 158/77 (08/27 0557) SpO2:  [97 %] 97 % (08/27 0557) Weight:  [96.3 kg] 96.3 kg (08/27 0543)  Labs:   Estimated body mass index is 35.31 kg/m as calculated from the following:   Height as of this encounter: 5\' 5"  (1.651 m).   Weight as of this encounter: 96.3 kg.   Imaging Review Plain radiographs demonstrate severe degenerative joint disease of the left knee(s). The overall alignment isneutral. The bone quality appears to be adequate for age and reported activity level.      Assessment/Plan:  End stage arthritis, left knee   The patient history, physical examination, clinical judgment of the provider and imaging studies are consistent with end stage degenerative joint disease of the left knee(s) and total knee arthroplasty is deemed medically necessary. The treatment options including medical management, injection therapy arthroscopy and arthroplasty were discussed at length. The risks and benefits of total knee arthroplasty were presented and reviewed. The risks due to aseptic loosening, infection, stiffness, patella tracking problems, thromboembolic complications and other imponderables were discussed. The patient acknowledged the explanation, agreed to proceed with the plan and consent was signed. Patient is being admitted for inpatient treatment for surgery, pain control, PT, OT, prophylactic antibiotics, VTE prophylaxis, progressive ambulation and ADL's and discharge planning. The patient is planning to be discharged  home.     Patient's anticipated LOS is less than 2 midnights, meeting these requirements: - Younger than 46 - Lives within 1 hour of care - Has a competent  adult at home to recover with post-op recover - NO history of  - Chronic pain requiring opiods  - Diabetes  - Coronary Artery Disease  - Heart failure  - Heart attack  - Stroke  - DVT/VTE  - Cardiac arrhythmia  - Respiratory Failure/COPD  - Renal failure  - Anemia  - Advanced Liver disease  Rosalene Billings, PA-C Orthopedic Surgery [facility] [place] [phone]

## 2022-11-19 ENCOUNTER — Encounter (HOSPITAL_COMMUNITY): Payer: Self-pay | Admitting: Orthopedic Surgery

## 2022-11-19 DIAGNOSIS — M1712 Unilateral primary osteoarthritis, left knee: Secondary | ICD-10-CM | POA: Diagnosis not present

## 2022-11-19 LAB — BASIC METABOLIC PANEL
Anion gap: 11 (ref 5–15)
BUN: 16 mg/dL (ref 8–23)
CO2: 24 mmol/L (ref 22–32)
Calcium: 8.5 mg/dL — ABNORMAL LOW (ref 8.9–10.3)
Chloride: 101 mmol/L (ref 98–111)
Creatinine, Ser: 0.77 mg/dL (ref 0.44–1.00)
GFR, Estimated: >60 mL/min (ref >60)
Glucose, Bld: 149 mg/dL — ABNORMAL HIGH (ref 70–99)
Potassium: 3.7 mmol/L (ref 3.5–5.1)
Sodium: 136 mmol/L (ref 135–145)

## 2022-11-19 LAB — CBC
HCT: 30.3 % — ABNORMAL LOW (ref 36.0–46.0)
Hemoglobin: 9.6 g/dL — ABNORMAL LOW (ref 12.0–15.0)
MCH: 30 pg (ref 26.0–34.0)
MCHC: 31.7 g/dL (ref 30.0–36.0)
MCV: 94.7 fL (ref 80.0–100.0)
Platelets: 206 10*3/uL (ref 150–400)
RBC: 3.2 MIL/uL — ABNORMAL LOW (ref 3.87–5.11)
RDW: 13.2 % (ref 11.5–15.5)
WBC: 10.1 10*3/uL (ref 4.0–10.5)
nRBC: 0 % (ref 0.0–0.2)

## 2022-11-19 MED ORDER — POLYETHYLENE GLYCOL 3350 17 G PO PACK: 17.0000 g | PACK | Freq: Two times a day (BID) | ORAL | 0 refills | Status: DC

## 2022-11-19 MED ORDER — SENNA 8.6 MG PO TABS: 2.0000 | ORAL_TABLET | Freq: Every day | ORAL | 0 refills | Status: AC

## 2022-11-19 MED ORDER — METHOCARBAMOL 500 MG PO TABS: 500.0000 mg | ORAL_TABLET | Freq: Four times a day (QID) | ORAL | 2 refills | Status: DC | PRN

## 2022-11-19 MED ORDER — OXYCODONE HCL 5 MG PO TABS: 5.0000 mg | ORAL_TABLET | ORAL | 0 refills | Status: DC | PRN

## 2022-11-19 MED ORDER — ASPIRIN 81 MG PO CHEW: 81.0000 mg | CHEWABLE_TABLET | Freq: Two times a day (BID) | ORAL | 0 refills | Status: AC

## 2022-11-19 NOTE — Care Management Obs Status (Signed)
MEDICARE OBSERVATION STATUS NOTIFICATION   Patient Details  Name: Wanda Newton MRN: [id number] Date of Birth: [birth year]   Medicare Observation Status Notification Given:  Yes    Amada Jupiter, LCSW 11/19/2022, 10:51 AM

## 2022-11-19 NOTE — Progress Notes (Signed)
   Subjective: 1 Day Post-Op Procedure(s) (LRB): LEFT TOTAL KNEE ARTHROPLASTY (Left) Patient reports pain as mild.   Patient seen in rounds by Dr. Charlann Boxer. Patient is well, and has had no acute complaints or problems. No acute events overnight. Foley catheter removed. Patient was not able to ambulate with PT secondary to residual spinal effects.  We will start therapy today.   Objective: Vital signs in last 24 hours: Temp:  [97.5 F (36.4 C)-98.4 F (36.9 C)] 98.4 F (36.9 C) (08/28 0433) Pulse Rate:  [53-86] 65 (08/28 0433) Resp:  [12-20] 16 (08/28 0433) BP: (109-153)/(51-88) 131/63 (08/28 0433) SpO2:  [92 %-99 %] 95 % (08/28 0433)  Intake/Output from previous day:  Intake/Output Summary (Last 24 hours) at 11/19/2022 0729 Last data filed at 11/19/2022 1610 Gross per 24 hour  Intake 3552.66 ml  Output 2325 ml  Net 1227.66 ml     Intake/Output this shift: No intake/output data recorded.  Labs: Recent Labs    11/19/22 0331  HGB 9.6*   Recent Labs    11/19/22 0331  WBC 10.1  RBC 3.20*  HCT 30.3*  PLT 206   Recent Labs    11/19/22 0331  NA 136  K 3.7  CL 101  CO2 24  BUN 16  CREATININE 0.77  GLUCOSE 149*  CALCIUM 8.5*   No results for input(s): "LABPT", "INR" in the last 72 hours.  Exam: General - Patient is Alert and Oriented Extremity - Neurologically intact Sensation intact distally Intact pulses distally Dorsiflexion/Plantar flexion intact Dressing - dressing C/D/I Motor Function - intact, moving foot and toes well on exam.   Past Medical History:  Diagnosis Date   Allergy    Arthritis    Blood transfusion without reported diagnosis    Complication of anesthesia    Hard time waking up.   Diabetes mellitus without complication (HCC)    "pre-diabetes"- Patient states no pre-diabetes   Hypertension    Vertigo     Assessment/Plan: 1 Day Post-Op Procedure(s) (LRB): LEFT TOTAL KNEE ARTHROPLASTY (Left) Principal Problem:   S/P total knee  arthroplasty, left  Estimated body mass index is 35.31 kg/m as calculated from the following:   Height as of this encounter: 5\' 5"  (1.651 m).   Weight as of this encounter: 96.3 kg. Advance diet Up with therapy D/C IV fluids   Patient's anticipated LOS is less than 2 midnights, meeting these requirements: - Younger than 38 - Lives within 1 hour of care - Has a competent adult at home to recover with post-op recover - NO history of  - Chronic pain requiring opiods  - Diabetes  - Coronary Artery Disease  - Heart failure  - Heart attack  - Stroke  - DVT/VTE  - Cardiac arrhythmia  - Respiratory Failure/COPD  - Renal failure  - Anemia  - Advanced Liver disease     DVT Prophylaxis - Aspirin Weight bearing as tolerated.  Plan is to go Home after hospital stay. Plan for discharge today following 1-2 sessions of PT as long as they are meeting their goals.   We will schedule OPPT.   Follow up in the office in 2 weeks.   Rosalene Billings, PA-C Orthopedic Surgery [phone] 11/19/2022, 7:29 AM

## 2022-11-19 NOTE — TOC Transition Note (Signed)
Transition of Care [facility]) - CM/SW Discharge Note   Patient Details  Name: Wanda Newton MRN: [id number] Date of Birth: 1951-01-22  [facility] Care [facility]) CM/SW Contact:  Amada Jupiter, LCSW Phone Number: 11/19/2022, 9:38 AM   Clinical Narrative:     Met with pt who confirms need for RW and no DME agency preference - order placed with [employer] for delivery to room.  OPPT already set up with [facility].  No further TOC needs.  Final next level of care: OP Rehab Barriers to Discharge: No Barriers Identified   Patient Goals and CMS Choice      Discharge Placement                         Discharge Plan and Services Additional resources added to the After Visit Summary for                  DME Arranged: Walker rolling DME Agency: [employer] Date DME Agency Contacted: 11/19/22 Time DME Agency Contacted: [phone] Representative spoke with at DME Agency: Loraine Leriche            Social Determinants of Health (SDOH) Interventions SDOH Screenings   Food Insecurity: No Food Insecurity (11/18/2022)  Housing: Low Risk  (11/18/2022)  Transportation Needs: No Transportation Needs (11/18/2022)  Utilities: Not At Risk (11/18/2022)  Financial Resource Strain: High Risk (04/21/2022)   Received from [facility]  Physical Activity: Sufficiently Active (04/21/2022)   Received from [facility]  Social Connections: Socially Integrated (04/21/2022)   Received from [facility]  Stress: Patient Declined (04/21/2022)   Received from [facility]  Tobacco Use: Medium Risk (11/18/2022)     Readmission Risk Interventions     No data to display

## 2022-11-19 NOTE — Progress Notes (Signed)
Physical Therapy Treatment Patient Details Name: Wanda Newton MRN: [id number] DOB: [birth year] Today's Date: 11/19/2022   History of Present Illness 72 yo female presents to therapy s/p L TKA on 11/18/2022 due to failure of conservative measures. Pt PMH includes but is not limited to: R TKA (2017), hernia, vertigo, HTN, and DM II.    PT Comments  POD # 1 am session Assisted OOB to amb in hallway. General transfer comment: <25% VC's on proper hand placement and safety with turns. General Gait Details: tolerated a functional distance of 70 feet with Sister "hands on" using safety belt and VC's to increase L heel strike to decrease "toe walking". Practiced stairs.  Then returned to room to perform some TE's following HEP handout.  Instructed on proper tech, freq as well as use of ICE.   Addressed all mobility questions, discussed appropriate activity, educated on use of ICE.  Pt ready for D/C to home.    If plan is discharge home, recommend the following: A little help with walking and/or transfers;A little help with bathing/dressing/bathroom;Assistance with cooking/housework;Assist for transportation;Help with stairs or ramp for entrance   Can travel by private vehicle        Equipment Recommendations  Rolling walker (2 wheels)    Recommendations for Other Services       Precautions / Restrictions Precautions Precautions: Knee;Fall Precaution Comments: instructed no pillow under knee Restrictions Weight Bearing Restrictions: No LLE Weight Bearing: Weight bearing as tolerated     Mobility  Bed Mobility   Bed Mobility: Supine to Sit           General bed mobility comments: demonstarted and instructed how to use belt to self assist LE    Transfers Overall transfer level: Needs assistance Equipment used: Rolling walker (2 wheels) Transfers: Sit to/from Stand Sit to Stand: Supervision           General transfer comment: <25% VC's on proper hand placement and safety with  turns.    Ambulation/Gait Ambulation/Gait assistance: Supervision Gait Distance (Feet): 70 Feet Assistive device: Rolling walker (2 wheels) Gait Pattern/deviations: Step-to pattern, Decreased stance time - left Gait velocity: decrease     General Gait Details: tolerated a functional distance of 70 feet with Sister "hands on" using safety belt and VC's to increase L heel strike to decrease "toe walking".   Stairs Stairs: Yes Stairs assistance: Contact guard assist Stair Management: One rail Left, Sideways, Forwards Number of Stairs: 6 General stair comments: with Sister "hands on" assisted up/down stairs using B hands on ONE rail.   Wheelchair Mobility     Tilt Bed    Modified Rankin (Stroke Patients Only)       Balance                                            Cognition Arousal: Alert Behavior During Therapy: WFL for tasks assessed/performed Overall Cognitive Status: Within Functional Limits for tasks assessed                                 General Comments: AxO x 3 eager to go home.  Will have Sister stay with her.        Exercises   Total Knee Replacement TE's following HEP handout 10 reps B LE ankle pumps 05 reps towel squeezes  05 reps knee presses 05 reps heel slides  05 reps SAQ's 05 reps SLR's 05 reps ABD Educated on use of gait belt to assist with TE's Followed by ICE    General Comments        Pertinent Vitals/Pain Pain Assessment Pain Assessment: 0-10 Pain Score: 5  Pain Location: L knee Pain Descriptors / Indicators: Discomfort, Grimacing, Operative site guarding Pain Intervention(s): Monitored during session, Premedicated before session, Repositioned, Ice applied    Home Living                          Prior Function            PT Goals (current goals can now be found in the care plan section) Progress towards PT goals: Progressing toward goals    Frequency    7X/week       PT Plan      Co-evaluation              AM-PAC PT "6 Clicks" Mobility   Outcome Measure  Help needed turning from your back to your side while in a flat bed without using bedrails?: A Little Help needed moving from lying on your back to sitting on the side of a flat bed without using bedrails?: A Little Help needed moving to and from a bed to a chair (including a wheelchair)?: A Little Help needed standing up from a chair using your arms (e.g., wheelchair or bedside chair)?: A Little Help needed to walk in hospital room?: A Little Help needed climbing 3-5 steps with a railing? : A Little 6 Click Score: 18    End of Session Equipment Utilized During Treatment: Gait belt   Patient left: in chair;with call bell/phone within reach;with family/visitor present Nurse Communication: Mobility status PT Visit Diagnosis: Unsteadiness on feet (R26.81);Muscle weakness (generalized) (M62.81);Difficulty in walking, not elsewhere classified (R26.2);Pain Pain - Right/Left: Left Pain - part of body: Knee;Leg     Time: 1008-1040 PT Time Calculation (min) (ACUTE ONLY): 32 min  Charges:    $Gait Training: 8-22 mins $Therapeutic Activity: 8-22 mins PT General Charges $$ ACUTE PT VISIT: 1 Visit                     Felecia Shelling  PTA Acute  Rehabilitation Services Office M-F          [phone]

## 2022-11-19 NOTE — Plan of Care (Signed)

## 2022-11-20 NOTE — Discharge Summary (Signed)
Patient ID: Wanda Newton MRN: [id number] DOB/AGE: 11/18/1950 72 y.o.  Admit date: 11/18/2022 Discharge date: 11/19/2022  Admission Diagnoses:  Left knee osteoarthritis   Discharge Diagnoses:  Principal Problem:   S/P total knee arthroplasty, left   Past Medical History:  Diagnosis Date   Allergy    Arthritis    Blood transfusion without reported diagnosis    Complication of anesthesia    Hard time waking up.   Diabetes mellitus without complication (HCC)    "pre-diabetes"- Patient states no pre-diabetes   Hypertension    Vertigo     Surgeries: Procedure(s): LEFT TOTAL KNEE ARTHROPLASTY on 11/18/2022   Consultants:   Discharged Condition: Improved  Hospital Course: Wanda Newton is an 72 y.o. female who was admitted 11/18/2022 for operative treatment ofS/P total knee arthroplasty, left. Patient has severe unremitting pain that affects sleep, daily activities, and work/hobbies. After pre-op clearance the patient was taken to the operating room on 11/18/2022 and underwent  Procedure(s): LEFT TOTAL KNEE ARTHROPLASTY.    Patient was given perioperative antibiotics:  Anti-infectives (From admission, onward)    Start     Dose/Rate Route Frequency Ordered Stop   11/18/22 1330  ceFAZolin (ANCEF) IVPB 2g/100 mL premix        2 g 200 mL/hr over 30 Minutes Intravenous Every 6 hours 11/18/22 1032 11/18/22 2029   11/18/22 0600  ceFAZolin (ANCEF) IVPB 2g/100 mL premix        2 g 200 mL/hr over 30 Minutes Intravenous On call to O.R. 11/18/22 0531 11/18/22 0727        Patient was given sequential compression devices, early ambulation, and chemoprophylaxis to prevent DVT. Patient worked with PT and was meeting their goals regarding safe ambulation and transfers.  Patient benefited maximally from hospital stay and there were no complications.    Recent vital signs: Patient Vitals for the past 24 hrs:  BP Temp Temp src Pulse Resp SpO2  11/19/22 0953 135/74 98.1 F (36.7 C) Oral (!)  56 15 97 %     Recent laboratory studies:  Recent Labs    11/19/22 0331  WBC 10.1  HGB 9.6*  HCT 30.3*  PLT 206  NA 136  K 3.7  CL 101  CO2 24  BUN 16  CREATININE 0.77  GLUCOSE 149*  CALCIUM 8.5*     Discharge Medications:   Allergies as of 11/19/2022       Reactions   Red Dye #40 (allura Red) Shortness Of Breath   Patient states this reaction ONLY occurred with BENADRYL TABLET   Amlodipine Swelling   Bilateral LE swelling with medication.         Medication List     TAKE these medications    aspirin 81 MG chewable tablet Chew 1 tablet (81 mg total) by mouth 2 (two) times daily for 28 days.   CALCIUM WITH D3 PO Take 1 tablet by mouth in the morning and at bedtime.   hydrochlorothiazide 50 MG tablet Commonly known as: HYDRODIURIL Take 1 tablet (50 mg total) by mouth daily.   meclizine 25 MG tablet Commonly known as: ANTIVERT Take 1 tablet (25 mg total) by mouth 3 (three) times daily as needed for dizziness.   methocarbamol 500 MG tablet Commonly known as: ROBAXIN Take 1 tablet (500 mg total) by mouth every 6 (six) hours as needed for muscle spasms (muscle pain).   oxyCODONE 5 MG immediate release tablet Commonly known as: Oxy IR/ROXICODONE Take 1 tablet (5 mg total)  by mouth every 4 (four) hours as needed for severe pain.   polyethylene glycol 17 g packet Commonly known as: MIRALAX / GLYCOLAX Take 17 g by mouth 2 (two) times daily.   Potassium 99 MG Tabs Take 99 mg by mouth in the morning and at bedtime.   RED YEAST RICE PO Take 1 capsule by mouth in the morning and at bedtime.   senna 8.6 MG Tabs tablet Commonly known as: SENOKOT Take 2 tablets (17.2 mg total) by mouth at bedtime for 14 days.               Discharge Care Instructions  (From admission, onward)           Start     Ordered   11/19/22 0000  Change dressing       Comments: Maintain surgical dressing until follow up in the clinic. If the edges start to pull up, may  reinforce with tape. If the dressing is no longer working, may remove and cover with gauze and tape, but must keep the area dry and clean.  Call with any questions or concerns.   11/19/22 0733            Diagnostic Studies: No results found.  Disposition: Discharge disposition: 01-Home or Self Care       Discharge Instructions     Call MD / Call 911   Complete by: As directed    If you experience chest pain or shortness of breath, CALL 911 and be transported to the hospital emergency room.  If you develope a fever above 101 F, pus (white drainage) or increased drainage or redness at the wound, or calf pain, call your surgeon's office.   Change dressing   Complete by: As directed    Maintain surgical dressing until follow up in the clinic. If the edges start to pull up, may reinforce with tape. If the dressing is no longer working, may remove and cover with gauze and tape, but must keep the area dry and clean.  Call with any questions or concerns.   Constipation Prevention   Complete by: As directed    Drink plenty of fluids.  Prune juice may be helpful.  You may use a stool softener, such as Colace (over the counter) 100 mg twice a day.  Use MiraLax (over the counter) for constipation as needed.   Diet - low sodium heart healthy   Complete by: As directed    Increase activity slowly as tolerated   Complete by: As directed    Weight bearing as tolerated with assist device (walker, cane, etc) as directed, use it as long as suggested by your surgeon or therapist, typically at least 4-6 weeks.   Post-operative opioid taper instructions:   Complete by: As directed    POST-OPERATIVE OPIOID TAPER INSTRUCTIONS: It is important to wean off of your opioid medication as soon as possible. If you do not need pain medication after your surgery it is ok to stop day one. Opioids include: Codeine, Hydrocodone(Norco, Vicodin), Oxycodone(Percocet, oxycontin) and hydromorphone amongst others.   Long term and even short term use of opiods can cause: Increased pain response Dependence Constipation Depression Respiratory depression And more.  Withdrawal symptoms can include Flu like symptoms Nausea, vomiting And more Techniques to manage these symptoms Hydrate well Eat regular healthy meals Stay active Use relaxation techniques(deep breathing, meditating, yoga) Do Not substitute Alcohol to help with tapering If you have been on opioids for less than two weeks  and do not have pain than it is ok to stop all together.  Plan to wean off of opioids This plan should start within one week post op of your joint replacement. Maintain the same interval or time between taking each dose and first decrease the dose.  Cut the total daily intake of opioids by one tablet each day Next start to increase the time between doses. The last dose that should be eliminated is the evening dose.      TED hose   Complete by: As directed    Use stockings (TED hose) for 2 weeks on both leg(s).  You may remove them at night for sleeping.        Follow-up Information     Durene Romans, MD. Schedule an appointment as soon as possible for a visit in 2 week(s).   Specialty: Orthopedic Surgery Contact information: [address] [place] [address] [place] [address] [phone]                  Signed: Cassandria Anger 11/20/2022, 7:24 AM

## 2023-12-02 ENCOUNTER — Emergency Department (HOSPITAL_COMMUNITY)

## 2023-12-02 ENCOUNTER — Encounter (HOSPITAL_COMMUNITY): Payer: Self-pay | Admitting: Emergency Medicine

## 2023-12-02 ENCOUNTER — Emergency Department (HOSPITAL_COMMUNITY): Admission: EM | Admit: 2023-12-02 | Discharge: 2023-12-02 | Disposition: A | Discharge status: Home / Self Care

## 2023-12-02 ENCOUNTER — Other Ambulatory Visit: Payer: Self-pay

## 2023-12-02 DIAGNOSIS — R2689 Other abnormalities of gait and mobility: Secondary | ICD-10-CM | POA: Insufficient documentation

## 2023-12-02 DIAGNOSIS — M79604 Pain in right leg: Secondary | ICD-10-CM | POA: Diagnosis present

## 2023-12-02 DIAGNOSIS — M79651 Pain in right thigh: Secondary | ICD-10-CM | POA: Insufficient documentation

## 2023-12-02 DIAGNOSIS — E876 Hypokalemia: Secondary | ICD-10-CM | POA: Insufficient documentation

## 2023-12-02 LAB — CBC
HCT: 38.6 % (ref 36.0–46.0)
Hemoglobin: 12.3 g/dL (ref 12.0–15.0)
MCH: 29.4 pg (ref 26.0–34.0)
MCHC: 31.9 g/dL (ref 30.0–36.0)
MCV: 92.1 fL (ref 80.0–100.0)
Platelets: 289 K/uL (ref 150–400)
RBC: 4.19 MIL/uL (ref 3.87–5.11)
RDW: 14.2 % (ref 11.5–15.5)
WBC: 6.6 K/uL (ref 4.0–10.5)
nRBC: 0 % (ref 0.0–0.2)

## 2023-12-02 LAB — COMPREHENSIVE METABOLIC PANEL WITH GFR
ALT: 17 U/L (ref 0–44)
AST: 25 U/L (ref 15–41)
Albumin: 4.2 g/dL (ref 3.5–5.0)
Alkaline Phosphatase: 75 U/L (ref 38–126)
Anion gap: 16 — ABNORMAL HIGH (ref 5–15)
BUN: 20 mg/dL (ref 8–23)
CO2: 22 mmol/L (ref 22–32)
Calcium: 10.6 mg/dL — ABNORMAL HIGH (ref 8.9–10.3)
Chloride: 103 mmol/L (ref 98–111)
Creatinine, Ser: 0.9 mg/dL (ref 0.44–1.00)
GFR, Estimated: >60 mL/min (ref >60)
Glucose, Bld: 123 mg/dL — ABNORMAL HIGH (ref 70–99)
Potassium: 3.4 mmol/L — ABNORMAL LOW (ref 3.5–5.1)
Sodium: 141 mmol/L (ref 135–145)
Total Bilirubin: 0.4 mg/dL (ref 0.0–1.2)
Total Protein: 9.5 g/dL — ABNORMAL HIGH (ref 6.5–8.1)

## 2023-12-02 LAB — MAGNESIUM: Magnesium: 1.7 mg/dL (ref 1.7–2.4)

## 2023-12-02 MED ORDER — TRAMADOL HCL 50 MG PO TABS: 50.0000 mg | ORAL_TABLET | Freq: Four times a day (QID) | ORAL | 0 refills | Status: DC | PRN

## 2023-12-02 NOTE — ED Provider Notes (Signed)
 [place] EMERGENCY DEPARTMENT AT [facility] Provider Note   CSN: [id number] Arrival date & time: 12/02/23  1535     Patient presents with: Leg Pain   Wanda Newton is a 73 y.o. female.   This is a 73 year old female presenting emergency department with right leg pain.  Reports pain to right thigh.  Been present for the past week.  No overt trauma that she can recall.  Sharp pain.  Worse with movement.  Is able to ambulate.  No numbness tingling changes in sensation.  No constitutional symptoms.  No fevers no chills.   Leg Pain      Prior to Admission medications   Medication Sig Start Date End Date Taking? Authorizing Provider  Ca Phosphate-Cholecalciferol (CALCIUM  WITH D3 PO) Take 1 tablet by mouth in the morning and at bedtime.    [provider]  hydrochlorothiazide  (HYDRODIURIL ) 50 MG tablet Take 1 tablet (50 mg total) by mouth daily. 12/15/17   Purcell Emil Schanz, MD  meclizine  (ANTIVERT ) 25 MG tablet Take 1 tablet (25 mg total) by mouth 3 (three) times daily as needed for dizziness. 09/23/16   Weber, Lauraine LITTIE, PA-C  methocarbamol  (ROBAXIN ) 500 MG tablet Take 1 tablet (500 mg total) by mouth every 6 (six) hours as needed for muscle spasms (muscle pain). 11/19/22   Patti Rosina SAUNDERS, PA-C  oxyCODONE  (OXY IR/ROXICODONE ) 5 MG immediate release tablet Take 1 tablet (5 mg total) by mouth every 4 (four) hours as needed for severe pain. 11/19/22   Patti Rosina SAUNDERS, PA-C  polyethylene glycol (MIRALAX  / GLYCOLAX ) 17 g packet Take 17 g by mouth 2 (two) times daily. 11/19/22   Patti Rosina SAUNDERS, PA-C  Potassium 99 MG TABS Take 99 mg by mouth in the morning and at bedtime.    [provider]  Red Yeast Rice Extract (RED YEAST RICE PO) Take 1 capsule by mouth in the morning and at bedtime.    [provider]    Allergies: Red dye #40 (allura red) and Amlodipine     Review of Systems  Updated Vital Signs BP 139/84 (BP Location: Right Arm)   Pulse 87    Temp 98.1 F (36.7 C) (Oral)   Resp 16   SpO2 100%   Physical Exam Vitals and nursing note reviewed.  Constitutional:      General: She is not in acute distress.    Appearance: She is not toxic-appearing.  HENT:     Head: Normocephalic.     Nose: Nose normal.     Mouth/Throat:     Mouth: Mucous membranes are moist.  Eyes:     Conjunctiva/sclera: Conjunctivae normal.  Cardiovascular:     Rate and Rhythm: Normal rate and regular rhythm.  Pulmonary:     Effort: Pulmonary effort is normal.  Abdominal:     General: Abdomen is flat. Bowel sounds are normal.  Musculoskeletal:        General: Normal range of motion.     Comments: Some tenderness to the right thigh, no size discrepancy.  Soft compartments.  2+ pulses.  Warm and well-perfused extremities.  Ambulatory with antalgic gait  Skin:    General: Skin is warm and dry.     Capillary Refill: Capillary refill takes less than 2 seconds.  Neurological:     Mental Status: She is alert.  Psychiatric:        Mood and Affect: Mood normal.        Behavior: Behavior normal.     (  all labs ordered are listed, but only abnormal results are displayed) Labs Reviewed  COMPREHENSIVE METABOLIC PANEL WITH GFR - Abnormal; Notable for the following components:      Result Value   Potassium 3.4 (*)    Glucose, Bld 123 (*)    Calcium  10.6 (*)    Total Protein 9.5 (*)    Anion gap 16 (*)    All other components within normal limits  CBC  MAGNESIUM     EKG: None  Radiology: DG Chest 2 View Result Date: 12/02/2023 CLINICAL DATA:  Bone lesions to femur.  Evaluate for primary cancer. EXAM: CHEST - 2 VIEW COMPARISON:  05/03/2012 FINDINGS: Heart and mediastinal contours are within normal limits. No focal opacities or effusions. Eventration of the anterior right hemidiaphragm. No acute bony abnormality. Aortic atherosclerosis. IMPRESSION: No active cardiopulmonary disease. Electronically Signed   By: Franky Crease M.D.   On: 12/02/2023 17:28    DG Pelvis 1-2 Views Result Date: 12/02/2023 CLINICAL DATA:  new mets/multiple myloma follow up on femur xr. EXAM: PELVIS - 1-2 VIEW COMPARISON:  Right femur series 12/02/2023. FINDINGS: Ill-defined mixed lucent and sclerotic area within the right inferior pubic ramus as seen on prior study. The previously seen lucency in the right intertrochanteric region not as well visualized, and the permeative femoral shaft lesion not imaged on this study. Symmetric mild joint space narrowing in the hips. No fracture, subluxation or dislocation. IMPRESSION: Mixed ill-defined lytic and sclerotic area within the right inferior pubic ramus as seen on prior femur series concerning for metastasis. Electronically Signed   By: Franky Crease M.D.   On: 12/02/2023 17:28   DG Femur Min 2 Views Right Result Date: 12/02/2023 CLINICAL DATA:  Leg pain femur pain EXAM: RIGHT FEMUR 2 VIEWS COMPARISON:  None Available. FINDINGS: Knee replacement. No definitive fracture is seen. Abnormal lucent and permeative lesion within the shaft of the femur measuring about 7.6 by 2.7 cm. Suspect that there may be an additional lucent lesion in the right ischium and a small lytic lesion in the right trochanter on the oblique view. IMPRESSION: Abnormal lucent and permeative lesion within the shaft of the femur measuring about 7.6 x 2.7 cm. Suspect additional lucent lesions in the right ischium and right trochanter. Findings are concerning for metastatic disease or myeloma. Primary bone neoplasm for the dominant shaft lesion not excluded. Electronically Signed   By: Luke Bun M.D.   On: 12/02/2023 16:23     Procedures   Medications Ordered in the ED - No data to display  Clinical Course as of 12/02/23 1747  Wed Dec 02, 2023  1629 DG Femur Min 2 Views Right IMPRESSION: Abnormal lucent and permeative lesion within the shaft of the femur measuring about 7.6 x 2.7 cm. Suspect additional lucent lesions in the right ischium and right  trochanter. Findings are concerning for metastatic disease or myeloma. Primary bone neoplasm for the dominant shaft lesion not excluded.   Electronically Signed   By: Luke Bun M.D.   On: 12/02/2023 16:23   [TY]  1743 DG Pelvis 1-2 Views IMPRESSION: Mixed ill-defined lytic and sclerotic area within the right inferior pubic ramus as seen on prior femur series concerning for metastasis.   Electronically Signed   By: Franky Crease M.D.   On: 12/02/2023 17:28   [TY]  1743 DG Chest 2 View MPRESSION: No active cardiopulmonary disease.   Electronically Signed   By: Franky Crease M.D.   On: 12/02/2023 17:28   [TY]  1743 CBC No anemia.  No leukocytosis or leukopenia [TY]  1743 Magnesium : 1.7 Normal [TY]  1743 Comprehensive metabolic panel(!) Minor low potassium, minor low calcium . [TY]    Clinical Course User Index [TY] Neysa Caron PARAS, DO                                 Medical Decision Making 73 year old presenting with right leg pain.  She is afebrile nontachycardic, normotensive.  Maintaining oxygen saturation on room air.  Physical exam without evidence of overt bacterial infection.  No overt trauma.  X-rays negative for fracture, but does have some lucencies concerning for metastatic process on femur xr.  Follow-up pelvis x-ray with similar finding to right hip/femur.  Chest x-ray without overt process.  Follow-up labs with no significant derangements.  Discussed follow-up with her primary doctor and referral to hematology oncology for further workup.  She is agreeable to plan as she does not want to be admitted to the hospital.  Return for worsening symptoms.  Discussed pain medications to go home with.  Amount and/or Complexity of Data Reviewed External Data Reviewed:     Details: No history of cancer in her EMR that I can locate. Labs: ordered. Decision-making details documented in ED Course. Radiology: ordered and independent interpretation performed.  Decision-making details documented in ED Course.    Details: No overt fractures  Risk Decision regarding hospitalization. Diagnosis or treatment significantly limited by social determinants of health.      Final diagnoses:  None    ED Discharge Orders     None          Neysa Caron PARAS, DO 12/02/23 1747

## 2023-12-02 NOTE — ED Provider Triage Note (Signed)
 Emergency Medicine Provider Triage Evaluation Note  Wanda Newton , a 73 y.o. female  was evaluated in triage.  Pt complains of right leg pain.  Pt reports to midthigh as area of pain.  Pt has had a knee replacement   Review of Systems  Positive: pain Negative:   Physical Exam  BP 139/84 (BP Location: Right Arm)   Pulse 87   Temp 98.1 F (36.7 C) (Oral)   Resp 16   SpO2 100%  Gen:   Awake, no distress   Resp:  Normal effort  MSK:   Moves extremities without difficulty  Other:  Tender right anterior thigh  Medical Decision Making  Medically screening exam initiated at 3:49 PM.  Appropriate orders placed.  Ronal LITTIE Pan was informed that the remainder of the evaluation will be completed by another provider, this initial triage assessment does not replace that evaluation, and the importance of remaining in the ED until their evaluation is complete.     Flint Sonny POUR, PA-C 12/02/23 1551

## 2023-12-02 NOTE — ED Triage Notes (Signed)
 PT reports pain to right femur after hitting it on something.  Has had both knees replaced she states and is doing well with that but right femur is painful.

## 2023-12-02 NOTE — Discharge Instructions (Signed)
 We are referring you to the hematology/oncology.  They should call to schedule an appointment, if you do not hear from them you may call to schedule an appointment.  Please follow-up with your primary doctor in the meantime.  Return if develop any new or worsening symptoms that are concerning to you.  May take over-the-counter medication such as Tylenol  ibuprofen for pain.

## 2023-12-03 ENCOUNTER — Encounter: Payer: Self-pay | Admitting: Medical Oncology

## 2023-12-04 ENCOUNTER — Encounter: Payer: Self-pay | Admitting: Physician Assistant

## 2023-12-04 ENCOUNTER — Inpatient Hospital Stay: Attending: Physician Assistant

## 2023-12-04 ENCOUNTER — Other Ambulatory Visit

## 2023-12-04 ENCOUNTER — Inpatient Hospital Stay (HOSPITAL_BASED_OUTPATIENT_CLINIC_OR_DEPARTMENT_OTHER): Admitting: Physician Assistant

## 2023-12-04 ENCOUNTER — Encounter: Payer: Self-pay | Admitting: Medical Oncology

## 2023-12-04 VITALS — BP 143/70 | HR 80 | Temp 97.6°F | Resp 17 | Ht 65.0 in | Wt 216.0 lb

## 2023-12-04 DIAGNOSIS — N838 Other noninflammatory disorders of ovary, fallopian tube and broad ligament: Secondary | ICD-10-CM | POA: Insufficient documentation

## 2023-12-04 DIAGNOSIS — Z79899 Other long term (current) drug therapy: Secondary | ICD-10-CM | POA: Diagnosis not present

## 2023-12-04 DIAGNOSIS — I10 Essential (primary) hypertension: Secondary | ICD-10-CM | POA: Diagnosis not present

## 2023-12-04 DIAGNOSIS — E119 Type 2 diabetes mellitus without complications: Secondary | ICD-10-CM | POA: Insufficient documentation

## 2023-12-04 DIAGNOSIS — K7689 Other specified diseases of liver: Secondary | ICD-10-CM | POA: Insufficient documentation

## 2023-12-04 DIAGNOSIS — I700 Atherosclerosis of aorta: Secondary | ICD-10-CM | POA: Insufficient documentation

## 2023-12-04 DIAGNOSIS — Z87891 Personal history of nicotine dependence: Secondary | ICD-10-CM | POA: Insufficient documentation

## 2023-12-04 DIAGNOSIS — C801 Malignant (primary) neoplasm, unspecified: Secondary | ICD-10-CM | POA: Insufficient documentation

## 2023-12-04 DIAGNOSIS — M899 Disorder of bone, unspecified: Secondary | ICD-10-CM

## 2023-12-04 DIAGNOSIS — C7951 Secondary malignant neoplasm of bone: Secondary | ICD-10-CM | POA: Insufficient documentation

## 2023-12-04 DIAGNOSIS — M129 Arthropathy, unspecified: Secondary | ICD-10-CM | POA: Insufficient documentation

## 2023-12-04 LAB — CBC WITH DIFFERENTIAL (CANCER CENTER ONLY)
Abs Immature Granulocytes: 0.01 K/uL (ref 0.00–0.07)
Basophils Absolute: 0 K/uL (ref 0.0–0.1)
Basophils Relative: 1 %
Eosinophils Absolute: 0.1 K/uL (ref 0.0–0.5)
Eosinophils Relative: 1 %
HCT: 33.2 % — ABNORMAL LOW (ref 36.0–46.0)
Hemoglobin: 11.2 g/dL — ABNORMAL LOW (ref 12.0–15.0)
Immature Granulocytes: 0 %
Lymphocytes Relative: 31 %
Lymphs Abs: 1.9 K/uL (ref 0.7–4.0)
MCH: 29.9 pg (ref 26.0–34.0)
MCHC: 33.7 g/dL (ref 30.0–36.0)
MCV: 88.5 fL (ref 80.0–100.0)
Monocytes Absolute: 0.5 K/uL (ref 0.1–1.0)
Monocytes Relative: 8 %
Neutro Abs: 3.6 K/uL (ref 1.7–7.7)
Neutrophils Relative %: 59 %
Platelet Count: 266 K/uL (ref 150–400)
RBC: 3.75 MIL/uL — ABNORMAL LOW (ref 3.87–5.11)
RDW: 14.2 % (ref 11.5–15.5)
WBC Count: 6.2 K/uL (ref 4.0–10.5)
nRBC: 0 % (ref 0.0–0.2)

## 2023-12-04 LAB — CMP (CANCER CENTER ONLY)
ALT: 14 U/L (ref 0–44)
AST: 21 U/L (ref 15–41)
Albumin: 3.9 g/dL (ref 3.5–5.0)
Alkaline Phosphatase: 59 U/L (ref 38–126)
Anion gap: 10 (ref 5–15)
BUN: 17 mg/dL (ref 8–23)
CO2: 27 mmol/L (ref 22–32)
Calcium: 10 mg/dL (ref 8.9–10.3)
Chloride: 100 mmol/L (ref 98–111)
Creatinine: 0.93 mg/dL (ref 0.44–1.00)
GFR, Estimated: >60 mL/min (ref >60)
Glucose, Bld: 107 mg/dL — ABNORMAL HIGH (ref 70–99)
Potassium: 3.6 mmol/L (ref 3.5–5.1)
Sodium: 137 mmol/L (ref 135–145)
Total Bilirubin: 0.3 mg/dL (ref 0.0–1.2)
Total Protein: 8.8 g/dL — ABNORMAL HIGH (ref 6.5–8.1)

## 2023-12-04 NOTE — Progress Notes (Signed)
 [facility]  Wanda Newton presented to clinic, with her friend [name], for her scheduled appointment with PA-C [employer]. I introduced myself and provided patient with my direct contact information. Patient was encouraged to call me with any questions/concerns she may have.  [name], RN, BSN, [facility] Oncology Nurse Navigator, [facility] 12/04/2023 3:42 PM

## 2023-12-04 NOTE — Progress Notes (Signed)
 [facility] [phone]   [phone]  INITIAL CONSULTATION:  Patient Care Team: Allen Lauraine LITTIE DEVONNA as PCP - General (Physician Assistant) Golden Forestine BROCKS, RN as Oncology Nurse Navigator (Medical Oncology)  CHIEF COMPLAINTS/PURPOSE OF CONSULTATION:  Bone lesion  HISTORY OF PRESENTING ILLNESS:  Wanda Newton 73 y.o. female with medical history significant for pre-diabetes, hypertension and arthritis presents to the rapid diagnostic clinic for evaluation for abnormal xray concerning for large bone lesions involving the right femur along with right tronchanter and inferior pubic ramus.   On review of the previous records, Wanda Newton presented to the ED after developing worsening right upper thigh pain for one week. Xray imaging of the right femur and pelvis was obtained that showed  permeative lesion within the shaft of the femur measuring about 7.6 by 2.7 cm. In addition, there are mixed ill-defined lytic and sclerotic lesions within the right tronchanter and inferior pubic ramus  On exam today, Wanda Newton reports she continues to have ongoing right hip and upper thigh pain. She is taking tramadol  50 mg as needed with some improvement of pain. She is using a walking stick to help with ambulation as she is limping due to pain. She reports her energy levels are fairly stable. She has a good appetite without any noticeable weight changes. She denies nausea, vomiting or bowel habit changes. She denies easy bruising or overt signs of bleeding. She denies fevers, chills, sweats, shortness of breath, chest pain or cough. She has no other complaints. Rest of the ROS is below.   MEDICAL HISTORY:  Past Medical History:  Diagnosis Date   Allergy    Arthritis    Blood transfusion without reported diagnosis    Complication of anesthesia    Hard time waking up.   Diabetes mellitus without complication (HCC)    pre-diabetes- Patient  states no pre-diabetes   Hypertension    Vertigo     SURGICAL HISTORY: Past Surgical History:  Procedure Laterality Date   ABDOMINAL HYSTERECTOMY     FOOT SURGERY     x3   TONSILLECTOMY     TOTAL KNEE ARTHROPLASTY Right 03/18/2016   Procedure: TOTAL KNEE ARTHROPLASTY;  Surgeon: Donnice Car, MD;  Location: [facility];  Service: Orthopedics;  Laterality: Right;   TOTAL KNEE ARTHROPLASTY Left 11/18/2022   Procedure: LEFT TOTAL KNEE ARTHROPLASTY;  Surgeon: Car Donnice, MD;  Location: [facility];  Service: Orthopedics;  Laterality: Left;    SOCIAL HISTORY: Social History   Socioeconomic History   Marital status: Single    Spouse name: n/a   Number of children: 3   Years of education: associates   Highest education level: Not on file  Occupational History   Occupation: Engineer, materials    Comment: cultural arts  Tobacco Use   Smoking status: Former    Current packs/day: 0.00    Types: Cigarettes    Quit date: 02/21/2001    Years since quitting: 22.8   Smokeless tobacco: Never  Vaping Use   Vaping status: Never Used  Substance and Sexual Activity   Alcohol use: No    Alcohol/week: 0.0 standard drinks of alcohol   Drug use: No   Sexual activity: Not Currently  Other Topics Concern   Not on file  Social History Narrative   Single - divorced   Education officer, community at [employer]   Likes to [employer] 04/2015   Children 3 and 15  grandchildren   Lives alone   Social Drivers of Health   Financial Resource Strain: Low Risk  (09/22/2023)   Received from [facility]   Overall Financial Resource Strain (CARDIA)    Difficulty of Paying Living Expenses: Not hard at all  Food Insecurity: Food Insecurity Present (09/22/2023)   Received from [facility]   Hunger Vital Sign    Within the past 12 months, you worried that your food would run out before you got the money to buy more.: Sometimes true    Within the past 12 months, the food you bought  just didn't last and you didn't have money to get more.: Never true  Transportation Needs: No Transportation Needs (09/22/2023)   Received from [facility] - Transportation    Lack of Transportation (Medical): No    Lack of Transportation (Non-Medical): No  Physical Activity: Sufficiently Active (09/22/2023)   Received from [facility]   Exercise Vital Sign    On average, how many days per week do you engage in moderate to strenuous exercise (like a brisk walk)?: 7 days    On average, how many minutes do you engage in exercise at this level?: 150+ min  Stress: No Stress Concern Present (09/22/2023)   Received from [facility] - Occupational Stress Questionnaire    Feeling of Stress : Not at all  Social Connections: Socially Integrated (09/22/2023)   Received from [facility]   Social Network    How would you rate your social network (family, work, friends)?: Good participation with social networks  Intimate Partner Violence: Not At Risk (09/22/2023)   Received from [facility]   HITS    Over the last 12 months how often did your partner physically hurt you?: Never    Over the last 12 months how often did your partner insult you or talk down to you?: Never    Over the last 12 months how often did your partner threaten you with physical harm?: Never    Over the last 12 months how often did your partner scream or curse at you?: Never    FAMILY HISTORY: Family History  Problem Relation Age of Onset   Diabetes Mother    Hypertension Mother    Diabetes Brother    Hypertension Brother    Diabetes Sister    Mental illness Sister 15       schizophrenia   Gout Brother     ALLERGIES:  is allergic to red dye #40 (allura red) and amlodipine .  MEDICATIONS:  Current Outpatient Medications  Medication Sig Dispense Refill   hydrochlorothiazide  (HYDRODIURIL ) 50 MG tablet Take 1 tablet (50 mg total) by mouth daily. 90 tablet 1    meclizine  (ANTIVERT ) 25 MG tablet Take 1 tablet (25 mg total) by mouth 3 (three) times daily as needed for dizziness. 60 tablet 0   Potassium 99 MG TABS Take 99 mg by mouth in the morning and at bedtime.     Red Yeast Rice Extract (RED YEAST RICE PO) Take 1 capsule by mouth in the morning and at bedtime.     methocarbamol  (ROBAXIN ) 500 MG tablet Take 1 tablet (500 mg total) by mouth every 6 (six) hours as needed for muscle spasms (muscle pain). (Patient not taking: Reported on 12/04/2023) 40 tablet 2   traMADol  (ULTRAM ) 50 MG tablet Take 1-2 tablets (50-100 mg total) by mouth every 6 (six) hours as needed. 90 tablet 0  No current facility-administered medications for this visit.    REVIEW OF SYSTEMS:   Constitutional: ( - ) fevers, ( - )  chills , ( - ) night sweats Eyes: ( - ) blurriness of vision, ( - ) double vision, ( - ) watery eyes Ears, nose, mouth, throat, and face: ( - ) mucositis, ( - ) sore throat Respiratory: ( - ) cough, ( - ) dyspnea, ( - ) wheezes Cardiovascular: ( - ) palpitation, ( - ) chest discomfort, ( - ) lower extremity swelling Gastrointestinal:  ( - ) nausea, ( - ) heartburn, ( - ) change in bowel habits Skin: ( - ) abnormal skin rashes Lymphatics: ( - ) new lymphadenopathy, ( - ) easy bruising Neurological: ( - ) numbness, ( - ) tingling, ( - ) new weaknesses Behavioral/Psych: ( - ) mood change, ( - ) new changes  All other systems were reviewed with the patient and are negative.  PHYSICAL EXAMINATION: ECOG PERFORMANCE STATUS: 1 - Symptomatic but completely ambulatory  Vitals:   12/04/23 1439  BP: (!) 143/70  Pulse: 80  Resp: 17  Temp: 97.6 F (36.4 C)  SpO2: 99%   Filed Weights   12/04/23 1439  Weight: 216 lb (98 kg)    GENERAL: well appearing female in NAD  SKIN: skin color, texture, turgor are normal, no rashes or significant lesions EYES: conjunctiva are pink and non-injected, sclera clear OROPHARYNX: no exudate, no erythema; lips, buccal mucosa,  and tongue normal  NECK: supple, non-tender LYMPH:  no palpable lymphadenopathy in the cervical, axillary or supraclavicular lymph nodes.  LUNGS: clear to auscultation and percussion with normal breathing effort HEART: regular rate & rhythm and no murmurs and no lower extremity edema ABDOMEN: soft, non-tender, non-distended, normal bowel sounds Musculoskeletal: no cyanosis of digits and no clubbing.  PSYCH: alert & oriented x 3, fluent speech NEURO: no focal motor/sensory deficits  LABORATORY DATA:  I have reviewed the data as listed    Latest Ref Rng & Units 12/04/2023    3:48 PM 12/02/2023    5:07 PM 11/19/2022    3:31 AM  CBC  WBC 4.0 - 10.5 K/uL 6.2  6.6  10.1   Hemoglobin 12.0 - 15.0 g/dL 88.7  87.6  9.6   Hematocrit 36.0 - 46.0 % 33.2  38.6  30.3   Platelets 150 - 400 K/uL 266  289  206        Latest Ref Rng & Units 12/04/2023    3:48 PM 12/02/2023    5:07 PM 11/19/2022    3:31 AM  CMP  Glucose 70 - 99 mg/dL 892  876  850   BUN 8 - 23 mg/dL 17  20  16    Creatinine 0.44 - 1.00 mg/dL 9.06  9.09  9.22   Sodium 135 - 145 mmol/L 137  141  136   Potassium 3.5 - 5.1 mmol/L 3.6  3.4  3.7   Chloride 98 - 111 mmol/L 100  103  101   CO2 22 - 32 mmol/L 27  22  24    Calcium  8.9 - 10.3 mg/dL 89.9  89.3  8.5   Total Protein 6.5 - 8.1 g/dL 8.8  9.5    Total Bilirubin 0.0 - 1.2 mg/dL 0.3  0.4    Alkaline Phos 38 - 126 U/L 59  75    AST 15 - 41 U/L 21  25    ALT 0 - 44 U/L 14  17  RADIOGRAPHIC STUDIES: I have personally reviewed the radiological images as listed and agreed with the findings in the report. NM PET Image Initial (PI) Whole Body Result Date: 12/10/2023 CLINICAL DATA:  Initial treatment strategy for aggressive bone lesion in the RIGHT femoral shaft. EXAM: NUCLEAR MEDICINE PET WHOLE BODY TECHNIQUE: 9.4 mCi F-18 FDG was injected intravenously. Full-ring PET imaging was performed from the head to foot after the radiotracer. CT data was obtained and used for attenuation  correction and anatomic localization. Fasting blood glucose: 104 mg/dl COMPARISON:  Plain film 12/02/2023 FINDINGS: HEAD/NECK: No hypermetabolic activity in the scalp. No hypermetabolic cervical lymph nodes. Incidental CT findings: none CHEST: No hypermetabolic mediastinal or hilar nodes. No suspicious pulmonary nodules on the CT scan. Incidental CT findings: none ABDOMEN/PELVIS: Choose one Multiple benign simple fluid attenuation hepatic cyst. Cystic enlargement of the LEFT ovary to 3.1 x 2.5 cm. The LEFT ovarian cyst is homogeneous simple fluid attenuation. No metabolic activity. Post hysterectomy. Incidental CT findings: none SKELETON: Lesion of concern in the midshaft RIGHT femur is moderately hypermetabolic with SUV max equal 6.2. There is thinning of the cortex associated with this aggressive lesion (image 215). There is a smaller lucent lesion in the head of the RIGHT femur measuring 14 mm image 75. This has mild radiotracer activity SUV max equal 3.5. No lesion associated with the RIGHT inferior pubic ramus or greater trochanter as referenced on comparison plain films. Incidental CT findings: none EXTREMITIES: RIGHT femoral lesion as described above. No additional lesions. Incidental CT findings: none IMPRESSION: 1. Moderately hypermetabolic aggressive lesion in the midshaft RIGHT femur with cortical thinning. Findings concerning for multiple myeloma. 2. Smaller lesion in the head of the RIGHT femur with mild radiotracer activity. Concerning for second myeloma lesion. 3. No evidence of plasmacytoma or multiple myeloma elsewhere on whole-body FDG PET scan. 4. Cystic enlargement of the LEFT ovary. Lesion appears benign on CT imaging and has no FDG activity. No specific follow-up recommended. Electronically Signed   By: Jackquline Boxer M.D.   On: 12/10/2023 08:41   DG Chest 2 View Result Date: 12/02/2023 CLINICAL DATA:  Bone lesions to femur.  Evaluate for primary cancer. EXAM: CHEST - 2 VIEW COMPARISON:   05/03/2012 FINDINGS: Heart and mediastinal contours are within normal limits. No focal opacities or effusions. Eventration of the anterior right hemidiaphragm. No acute bony abnormality. Aortic atherosclerosis. IMPRESSION: No active cardiopulmonary disease. Electronically Signed   By: Franky Crease M.D.   On: 12/02/2023 17:28   DG Pelvis 1-2 Views Result Date: 12/02/2023 CLINICAL DATA:  new mets/multiple myloma follow up on femur xr. EXAM: PELVIS - 1-2 VIEW COMPARISON:  Right femur series 12/02/2023. FINDINGS: Ill-defined mixed lucent and sclerotic area within the right inferior pubic ramus as seen on prior study. The previously seen lucency in the right intertrochanteric region not as well visualized, and the permeative femoral shaft lesion not imaged on this study. Symmetric mild joint space narrowing in the hips. No fracture, subluxation or dislocation. IMPRESSION: Mixed ill-defined lytic and sclerotic area within the right inferior pubic ramus as seen on prior femur series concerning for metastasis. Electronically Signed   By: Franky Crease M.D.   On: 12/02/2023 17:28   DG Femur Min 2 Views Right Result Date: 12/02/2023 CLINICAL DATA:  Leg pain femur pain EXAM: RIGHT FEMUR 2 VIEWS COMPARISON:  None Available. FINDINGS: Knee replacement. No definitive fracture is seen. Abnormal lucent and permeative lesion within the shaft of the femur measuring about 7.6 by 2.7 cm. Suspect  that there may be an additional lucent lesion in the right ischium and a small lytic lesion in the right trochanter on the oblique view. IMPRESSION: Abnormal lucent and permeative lesion within the shaft of the femur measuring about 7.6 x 2.7 cm. Suspect additional lucent lesions in the right ischium and right trochanter. Findings are concerning for metastatic disease or myeloma. Primary bone neoplasm for the dominant shaft lesion not excluded. Electronically Signed   By: Luke Bun M.D.   On: 12/02/2023 16:23    ASSESSMENT &  PLAN Wanda Newton is a 73 y.o.female who presents to the rapid diagnostic clinic for evaluation of arge bone lesions involving the right femur along with right tronchanter and inferior pubic ramus.   #Bone lesions involving the right femur along with right tronchanter and inferior pubic ramus.  --Differentials include benign lesion, metastatic bone lesion, myeloma lesion or soft tissue malignancy.  --Labs today include CBC, CMP, LDH, SPEP/IFE, serum free light chains --Will obtain PET/CT scan to further evaluate bone lesion and other possible target lesions. Scheduled for 12/09/2023.  --Proceed with IR biopsy based on PET/CT scan results --RTC once workup is complete  Right hip/leg pain: --Pain improved with tramadol  50 mg PO q 6 hours. Refill sent.   Orders Placed This Encounter  Procedures   NM PET Image Initial (PI) Whole Body    Standing Status:   Future    Number of Occurrences:   1    Expected Date:   12/05/2023    Expiration Date:   12/03/2024    If indicated for the ordered procedure, I authorize the administration of a radiopharmaceutical per Radiology protocol:   Yes    Preferred imaging location?:   [place]   CBC with Differential (Cancer Center Only)    Standing Status:   Future    Number of Occurrences:   1    Expiration Date:   12/03/2024   CMP (Cancer Center only)    Standing Status:   Future    Number of Occurrences:   1    Expiration Date:   12/03/2024   Multiple Myeloma Panel (SPEP&IFE w/QIG)    Standing Status:   Future    Number of Occurrences:   1    Expiration Date:   12/03/2024   Kappa/lambda light chains    Standing Status:   Future    Number of Occurrences:   1    Expiration Date:   12/03/2024    All questions were answered. The patient knows to call the clinic with any problems, questions or concerns.  I have spent a total of 60 minutes minutes of face-to-face and non-face-to-face time, preparing to see the patient, obtaining and/or reviewing  separately obtained history, performing a medically appropriate examination, counseling and educating the patient, ordering medications/tests/procedures, referring and communicating with other health care professionals, documenting clinical information in the electronic health record, independently interpreting results and communicating results to the patient, and care coordination.   Johnston Police, PA-C Department of Hematology/Oncology [facility] at [facility] Phone: [phone]  Patient was seen with Dr. Federico  I have read the above note and personally examined the patient. I agree with the assessment and plan as noted above.  Briefly Wanda Newton is a 73 year old female who presents for evaluation of a right femur lesion.  The patient underwent an x-ray of her right femur and hip due to increasing pain.  The x-ray showed a large 7.6  by 2.7 cm lesion which was mixed sclerotic and lytic.  Due to concern for these findings the patient was referred to rapid diagnostic clinic.  The patient reports that her pain is currently under good control.  Possible etiologies for this lesion include metastatic solid tumor versus multiple myeloma.  Will order full myeloma panel to include SPEP, UPEP, and serum free light chains.  In the event that there is concern for multiple myeloma we will pursue bone marrow biopsy and biopsy of the bone lesion.  In the event that myeloma labs are negative would order full PET CT scan in order to assess for primary lesion.  The patient voiced understanding of our findings and plan moving forward.   Norleen IVAR Kidney, MD Department of Hematology/Oncology [facility] at [facility] Phone: [phone] Pager: [phone] Email: [email or web address]

## 2023-12-07 ENCOUNTER — Encounter: Payer: Self-pay | Admitting: Medical Oncology

## 2023-12-07 LAB — KAPPA/LAMBDA LIGHT CHAINS
Kappa free light chain: 85.8 mg/L — ABNORMAL HIGH (ref 3.3–19.4)
Kappa, lambda light chain ratio: 8.41 — ABNORMAL HIGH (ref 0.26–1.65)
Lambda free light chains: 10.2 mg/L (ref 5.7–26.3)

## 2023-12-08 MED ORDER — TRAMADOL HCL 50 MG PO TABS: 50.0000 mg | ORAL_TABLET | Freq: Four times a day (QID) | ORAL | 0 refills | Status: DC | PRN

## 2023-12-09 ENCOUNTER — Encounter (HOSPITAL_COMMUNITY): Payer: Self-pay

## 2023-12-09 ENCOUNTER — Encounter (HOSPITAL_COMMUNITY): Admission: RE | Admit: 2023-12-09 | Source: Ambulatory Visit

## 2023-12-09 ENCOUNTER — Ambulatory Visit (HOSPITAL_COMMUNITY)
Admission: RE | Admit: 2023-12-09 | Discharge: 2023-12-09 | Disposition: A | Discharge status: Home / Self Care | Source: Ambulatory Visit | Attending: Physician Assistant | Admitting: Physician Assistant

## 2023-12-09 DIAGNOSIS — C9000 Multiple myeloma not having achieved remission: Secondary | ICD-10-CM | POA: Insufficient documentation

## 2023-12-09 DIAGNOSIS — N83202 Unspecified ovarian cyst, left side: Secondary | ICD-10-CM | POA: Insufficient documentation

## 2023-12-09 DIAGNOSIS — N838 Other noninflammatory disorders of ovary, fallopian tube and broad ligament: Secondary | ICD-10-CM | POA: Diagnosis not present

## 2023-12-09 DIAGNOSIS — M899 Disorder of bone, unspecified: Secondary | ICD-10-CM | POA: Diagnosis present

## 2023-12-09 DIAGNOSIS — K7689 Other specified diseases of liver: Secondary | ICD-10-CM | POA: Insufficient documentation

## 2023-12-09 LAB — MULTIPLE MYELOMA PANEL, SERUM
Albumin SerPl Elph-Mcnc: 3.2 g/dL (ref 2.9–4.4)
Albumin/Glob SerPl: 0.7 (ref 0.7–1.7)
Alpha 1: 0.2 g/dL (ref 0.0–0.4)
Alpha2 Glob SerPl Elph-Mcnc: 0.8 g/dL (ref 0.4–1.0)
B-Globulin SerPl Elph-Mcnc: 3.2 g/dL — ABNORMAL HIGH (ref 0.7–1.3)
Gamma Glob SerPl Elph-Mcnc: 0.8 g/dL (ref 0.4–1.8)
Globulin, Total: 5 g/dL — ABNORMAL HIGH (ref 2.2–3.9)
IgA: 2257 mg/dL — ABNORMAL HIGH (ref 64–422)
IgG (Immunoglobin G), Serum: 808 mg/dL (ref 586–1602)
IgM (Immunoglobulin M), Srm: 136 mg/dL (ref 26–217)
M Protein SerPl Elph-Mcnc: 1.7 g/dL — ABNORMAL HIGH
Total Protein ELP: 8.2 g/dL (ref 6.0–8.5)

## 2023-12-09 LAB — GLUCOSE, CAPILLARY: Glucose-Capillary: 104 mg/dL — ABNORMAL HIGH (ref 70–99)

## 2023-12-09 MED ORDER — FLUDEOXYGLUCOSE F - 18 (FDG) INJECTION
9.3980 | Freq: Once | INTRAVENOUS | Status: AC
  Administered 2023-12-09: 9.398 via INTRAVENOUS

## 2023-12-10 ENCOUNTER — Telehealth: Payer: Self-pay | Admitting: Physician Assistant

## 2023-12-10 NOTE — Telephone Encounter (Signed)
 I called Rev. Stutz to review the labs from 12/04/2023 and PET scan from yesterday. Labs show monoclonal protein measuring 1.7 g/dL. IFE showed IgA monoclonal protein with kappa light chain specificity. Kappa light chain is elevated at 85.8 with ratio 8.41. PET scan shows hypermetabolic aggressive lesion in the midshaft right femur and smaller lesion in the head of the right femur with mild radiotracer activity.   Discussed next steps including bone marrow biopsy as above findings are concerning for multiple myeloma. Patient requested some time to think this over as a biopsy is an invasive procedure. Patient will call back today or our team will follow up tomorrow.

## 2023-12-11 ENCOUNTER — Encounter: Payer: Self-pay | Admitting: Medical Oncology

## 2023-12-11 NOTE — Progress Notes (Signed)
 [facility]  Follow up call to patient to inquire of her decision regarding having the bone marrow biopsy. Patient stated that she does not want to have it. Patient stated that she has an appointment with her PCP, at [facility], on Monday and will speak with him regarding her recent work-up. I gave patient my understanding, thanked her for her time and encouraged her to call me with questions/concerns.   Work-up notes to be sent to her listed PCP.   [name], RN, BSN, [facility] Oncology Nurse Navigator, [facility] 12/11/2023 3:34 PM

## 2023-12-18 NOTE — Telephone Encounter (Signed)
 Spoke to pt 12/18/2023,  Patient stated that she will have all her care at [facility],  thanked her for her time and encouraged her to call with questions/concerns.

## 2023-12-24 ENCOUNTER — Encounter: Payer: Self-pay | Admitting: Physician Assistant
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
## 2023-12-28 NOTE — Care Plan (Signed)
 START ON PATHWAY REGIMEN - Multiple Myeloma and Other Plasma Cell Dyscrasias  [id number]      Lenalidomide (Revlimid)      Dexamethasone       Bortezomib (Velcade)      Daratumumab and hyaluronidase-fihj (Darzalex Faspro)        Lenalidomide (Revlimid)      Dexamethasone       Bortezomib (Velcade)      Daratumumab and hyaluronidase-fihj (Darzalex Faspro)   **Always confirm dose/schedule in your pharmacy ordering system**  Patient Characteristics: Multiple Myeloma, Newly Diagnosed, Transplant Eligible, Unknown Risk or Awaiting Test Results Disease Classification: Multiple Myeloma Therapeutic Status: Newly Diagnosed R2-ISS Staging: Awaiting Test Results Is Patient Eligible for Transplant Transplant Eligible Risk Status: Awaiting Test Results  Intent of Therapy: Non-Curative / Palliative Intent, Discussed with Patient [id number]

## 2023-12-28 NOTE — Progress Notes (Signed)
 Bone Marrow Procedure Note  Patient was informed of the bone marrow aspirate and biopsy procedure, its purpose and technique, its risks and benefits. An informed consent was signed.  A Time Out was taken.  The patient was re-identified by name and date of birth.  The patient was positioned on the table on the left decubitus position. . The right posterior iliac crest was prepped and draped in a sterile fashion.  Anesthesia was with local infiltration of 2% lidocaine.  Bone marrow aspirate, biopsy and separate aspirate for Flow Cytometry and Cytogenetics were obtained. A pressure dressing was applied.  Patient tolerated procedure well, and was discharged from the clinic in good condition.  Post-procedure instructions were given.

## 2024-01-04 NOTE — Progress Notes (Signed)
 Patient ID:  Wanda Newton is a 73 y.o. (DOB [birth year]) female.    Assessment and Plan   Assessment & Plan 1. Multiple Myeloma. The patient had a previous PET scan which revealed a lesion in the right leg indicative of multiple myeloma.  She underwent a recent bone marrow biopsy which confirmed the diagnosis.  Today, she will proceed with her first cycle of chemotherapy consisting of daratumumab, Revlimid, Velcade, dexamethasone .  Lab work from today has been reviewed and is adequate to proceed with treatment.  Will monitor her serum protein electrophoresis closely while she is receiving treatment.  2.  Anemia. Anemia secondary to underlying multiple myeloma.  Hemoglobin is stable today at 11.3.  Will continue to monitor.  3.  Hypercalcemia. The patient has mild hypercalcemia which is due to her multiple myeloma.  She will proceed with Zometa today.  4. Right Leg Pain. Significant discomfort in the right leg due to a bone spur is reported, although the frequency of pain has decreased. Pain is currently managed with Tylenol . Continuing Tylenol  was advised as it is safer for the stomach compared to other pain relievers like aspirin . Elevating legs during the day when possible was recommended to help reduce fluid retention and associated swelling.  Follow-up: Labs/chemotherapy weekly, lab/office visit/chemotherapy on day 1 of each cycle.    Subjective   Patient ID:  Wanda Newton is a 73 y.o. (DOB 1950/08/21) female    Patient presents with  . Follow-up    History of Present Illness The patient is a 73 year old female who presents for follow-up for multiple myeloma. She is due to begin treatment today with Velcade, daratumumab, Revlimid, and dexamethasone . Additionally, she will start Zometa for her bone lesion/hypercalcemia. Lab work performed today, 01/04/2024, showed a WBC of 5.5, hemoglobin 11.3, MCV 93.1, and platelets 302,000. The CMP, TSH, and serum protein electrophoresis are currently  pending.  She underwent a bone marrow biopsy last week, which she found to be a challenging experience, particularly due to sharp shooting pain down her leg during the procedure. She has been in contact with the specialty pharmacy regarding her medications and reports that they have been very helpful, even providing support over the weekend.  Bone marrow biopsy aspirate showed monoclonal plasma cells (2% of total cells) consistent with plasma cell neoplasm.  The bone/clot/aspirate and peripheral smear was consistent with plasma cell neoplasm (20% plasma cells and 4050% cellular marrow).  FISH pending. ClonoSEQ B-cell ID, and a karyotype are also pending.  She reports no nausea or vomiting. However, she has been experiencing frequent urination at night. Significant discomfort is noted due to a bone spur in her right leg, although the frequency of this pain has decreased. For mobility, she uses a walker but is currently using a wheelchair to avoid further irritation of her leg.  Pain management includes Tylenol , as advised by Dr. Laurence, to avoid Bayer aspirin  due to potential bleeding risks. She also takes Flexall joint pain relievers, which she finds effective.  Oncology History  Multiple myeloma not having achieved remission (*)  12/25/2023 Initial Diagnosis   Multiple myeloma not having achieved remission (*)   01/01/2024 -  Chemotherapy   daratumumab-hyaluronidase-fihj (DARZALEX FASPRO) injection 1,800 mg, 1,800 mg, Subcutaneous, Once, 1 of 15 cycles bortezomib (VELCADE) injection 2.7 mg 1.08 mL, 1.3 mg/m2 = 2.7 mg, Subcutaneous, Once, 1 of 8 cycles      01/01/2024 -  Supportive Care   OP Supportive Zoledronic Acid every 4 weeks for Multiple Myeloma and Bone  Metastases Plan Provider: Johnie Door, MD Treatment goal: Supportive Line of treatment: [No plan line of treatment]   01/05/2024 - 01/05/2024 Chemotherapy   daratumumab-hyaluronidase-fihj (DARZALEX FASPRO) injection 1,800 mg, 1,800  mg, Subcutaneous, Once, 0 of 2 cycles bortezomib (VELCADE) injection 2.7 mg 1.08 mL, 1.3 mg/m2 = 2.7 mg, Subcutaneous, Once, 0 of 2 cycles         Reviewed and updated this visit by provider: None        Review of Systems  Constitutional:  Negative for chills, fatigue and fever.  HENT: Negative.    Eyes: Negative.   Respiratory:  Negative for cough, chest tightness and shortness of breath.   Cardiovascular:  Positive for leg swelling. Negative for chest pain.  Gastrointestinal:  Negative for abdominal pain, constipation, diarrhea, nausea and vomiting.  Endocrine: Negative.   Genitourinary: Negative.   Musculoskeletal:  Positive for arthralgias.  Skin: Negative.   Neurological:  Negative for dizziness, seizures and headaches.  Hematological: Negative.   Psychiatric/Behavioral: Negative.       Objective   Vitals:   01/04/24 1118  BP: 133/70  Patient Position: Sitting  Pulse: 71  Temp: 97.5 F (36.4 C)  TempSrc: Temporal  Resp: 18  Height: 5' 5 (1.651 m)  Weight: 206 lb (93.4 kg)  SpO2: 99%  BMI (Calculated): 34.3  PainSc:   8  PainLoc: (S) Leg Comment: Right leg    ECOG Performance Status: 2 - Symptomatic, <50% confined to bed  Physical Exam Vitals reviewed.  Constitutional:      General: She is not in acute distress. HENT:     Head: Normocephalic.  Eyes:     General: No scleral icterus.    Conjunctiva/sclera: Conjunctivae normal.  Cardiovascular:     Rate and Rhythm: Normal rate and regular rhythm.  Musculoskeletal:        General: Normal range of motion.     Right lower leg: Edema present.     Left lower leg: Edema present.  Pulmonary:     Effort: Pulmonary effort is normal. No respiratory distress.     Breath sounds: Normal breath sounds.  Abdominal:     General: Bowel sounds are normal.     Palpations: Abdomen is soft.     Tenderness: There is no abdominal tenderness.  Skin:    General: Skin is warm and dry.  Neurological:     Mental  Status: She is alert and oriented to person, place, and time.  Psychiatric:        Mood and Affect: Mood normal.        Behavior: Behavior normal.        Thought Content: Thought content normal.        Judgment: Judgment normal.      Results Labs  - Complete Blood Count (CBC): 01/04/2024, WBC 5.5, hemoglobin 11.3, MCV 93.1, platelets 302,000  Recent Results (from the past 72 hours)  CBC And Differential   Collection Time: 01/04/24 11:03 AM  Result Value Ref Range   WBC 5.5 3.7 - 11.0 thou/mcL   RBC 3.75 (L) 4.01 - 4.90 million/mcL   HGB 11.3 (L) 12.2 - 14.9 gm/dL   HCT 65.0 (L) 64.1 - 52.0 %   MCV 93.1 82.0 - 98.0 fL   MCH 30.1 27.0 - 33.0 pg   MCHC 32.4 31.0 - 37.0 gm/dL   Plt Ct 697 849 - 599 thou/mcL   RDW SD 49.5 (H) 36.0 - 47.0 fL   RDW CV 14.3 11.8 - 14.9 %  NEUTROPHIL % 61.4 %   LYMPHOCYTE % 30.3 %   MONOCYTE % 6.8 %   Eosinophil % 0.9 %   BASOPHIL % 0.4 %   ABSOLUTE NEUTROPHIL COUNT 3.36 1.50 - 7.50 thou/mcL   ABSOLUTE LYMPHOCYTE COUNT 1.66 1.00 - 4.50 thou/mcL   Absolute Monocyte Count 0.37 0.10 - 0.80 thou/mcL   Absolute Eosinophil Count 0.05 0.00 - 0.50 thou/mcL   Absolute Basophil Count 0.02 0.00 - 0.20 thou/mcL      Recent Results (from the past 72 hours)  Comprehensive Metabolic Panel   Collection Time: 01/04/24 11:03 AM  Result Value Ref Range   Na 142 128 - 145 mmol/L   Potassium 3.5 (L) 3.6 - 5.1 mmol/L   Cl 105 98 - 108 mmol/L   CO2 27 18 - 33 mmol/L   AGAP 10 mmol/L   Glucose 113 73 - 118 mg/dL   BUN 12 7 - 22 mg/dL   Creatinine 9.09 9.39 - 1.20 mg/dL   Ca 89.2 (H) 8.0 - 89.6 mg/dL   ALK PHOS 67 42 - 858 U/L   T Bili 0.5 0.2 - 1.6 mg/dL   Total Protein 9.4 (H) 6.4 - 8.1 gm/dL   Alb 3.3 3.3 - 5.5 gm/dL   GLOBULIN 6.1 gm/dL   ALBUMIN/GLOBULIN RATIO 0.5    BUN/CREAT RATIO 13.3    ALT 20 10 - 47 U/L   AST 32 11 - 38 U/L   eGFR 68 >=59 mL/min/1.67m2       Allean JONELLE Bud, NP seen for and under the supervision of Dr. Johnie Door.   01/04/2024, 11:20 AM   Computer technology was used to create visit note. Consent from the patient/caregiver was obtained prior to its use.

## 2024-01-07 ENCOUNTER — Other Ambulatory Visit: Payer: Self-pay

## 2024-01-07 ENCOUNTER — Emergency Department (HOSPITAL_COMMUNITY)

## 2024-01-07 ENCOUNTER — Inpatient Hospital Stay (HOSPITAL_COMMUNITY)
Admission: EM | Admit: 2024-01-07 | Discharge: 2024-01-14 | DRG: 481 | Disposition: A | Discharge status: Home / Self Care | Attending: Internal Medicine | Admitting: Internal Medicine

## 2024-01-07 ENCOUNTER — Encounter (HOSPITAL_COMMUNITY): Payer: Self-pay

## 2024-01-07 DIAGNOSIS — Z79899 Other long term (current) drug therapy: Secondary | ICD-10-CM

## 2024-01-07 DIAGNOSIS — Z96653 Presence of artificial knee joint, bilateral: Secondary | ICD-10-CM | POA: Diagnosis present

## 2024-01-07 DIAGNOSIS — S72009A Fracture of unspecified part of neck of unspecified femur, initial encounter for closed fracture: Secondary | ICD-10-CM | POA: Diagnosis present

## 2024-01-07 DIAGNOSIS — D630 Anemia in neoplastic disease: Secondary | ICD-10-CM | POA: Diagnosis present

## 2024-01-07 DIAGNOSIS — S7290XA Unspecified fracture of unspecified femur, initial encounter for closed fracture: Secondary | ICD-10-CM | POA: Diagnosis present

## 2024-01-07 DIAGNOSIS — S72351A Displaced comminuted fracture of shaft of right femur, initial encounter for closed fracture: Principal | ICD-10-CM

## 2024-01-07 DIAGNOSIS — Z6836 Body mass index (BMI) 36.0-36.9, adult: Secondary | ICD-10-CM

## 2024-01-07 DIAGNOSIS — M898X9 Other specified disorders of bone, unspecified site: Secondary | ICD-10-CM | POA: Diagnosis present

## 2024-01-07 DIAGNOSIS — I10 Essential (primary) hypertension: Secondary | ICD-10-CM | POA: Diagnosis present

## 2024-01-07 DIAGNOSIS — Z7901 Long term (current) use of anticoagulants: Secondary | ICD-10-CM

## 2024-01-07 DIAGNOSIS — Z888 Allergy status to other drugs, medicaments and biological substances status: Secondary | ICD-10-CM | POA: Diagnosis not present

## 2024-01-07 DIAGNOSIS — Z9109 Other allergy status, other than to drugs and biological substances: Secondary | ICD-10-CM | POA: Diagnosis not present

## 2024-01-07 DIAGNOSIS — C9000 Multiple myeloma not having achieved remission: Secondary | ICD-10-CM | POA: Diagnosis present

## 2024-01-07 DIAGNOSIS — M1990 Unspecified osteoarthritis, unspecified site: Secondary | ICD-10-CM | POA: Diagnosis present

## 2024-01-07 DIAGNOSIS — Z833 Family history of diabetes mellitus: Secondary | ICD-10-CM | POA: Diagnosis not present

## 2024-01-07 DIAGNOSIS — D638 Anemia in other chronic diseases classified elsewhere: Secondary | ICD-10-CM | POA: Diagnosis present

## 2024-01-07 DIAGNOSIS — S7291XA Unspecified fracture of right femur, initial encounter for closed fracture: Secondary | ICD-10-CM | POA: Diagnosis not present

## 2024-01-07 DIAGNOSIS — E66812 Obesity, class 2: Secondary | ICD-10-CM | POA: Diagnosis present

## 2024-01-07 DIAGNOSIS — Z751 Person awaiting admission to adequate facility elsewhere: Secondary | ICD-10-CM

## 2024-01-07 DIAGNOSIS — M84451G Pathological fracture, right femur, subsequent encounter for fracture with delayed healing: Secondary | ICD-10-CM | POA: Diagnosis not present

## 2024-01-07 DIAGNOSIS — D62 Acute posthemorrhagic anemia: Secondary | ICD-10-CM | POA: Diagnosis not present

## 2024-01-07 DIAGNOSIS — Z7982 Long term (current) use of aspirin: Secondary | ICD-10-CM | POA: Diagnosis not present

## 2024-01-07 DIAGNOSIS — Z87891 Personal history of nicotine dependence: Secondary | ICD-10-CM | POA: Diagnosis not present

## 2024-01-07 DIAGNOSIS — E876 Hypokalemia: Secondary | ICD-10-CM | POA: Diagnosis present

## 2024-01-07 DIAGNOSIS — R001 Bradycardia, unspecified: Secondary | ICD-10-CM | POA: Diagnosis not present

## 2024-01-07 DIAGNOSIS — E119 Type 2 diabetes mellitus without complications: Secondary | ICD-10-CM | POA: Diagnosis not present

## 2024-01-07 DIAGNOSIS — M84551A Pathological fracture in neoplastic disease, right femur, initial encounter for fracture: Principal | ICD-10-CM | POA: Diagnosis present

## 2024-01-07 DIAGNOSIS — Z8249 Family history of ischemic heart disease and other diseases of the circulatory system: Secondary | ICD-10-CM | POA: Diagnosis not present

## 2024-01-07 DIAGNOSIS — Z818 Family history of other mental and behavioral disorders: Secondary | ICD-10-CM | POA: Diagnosis not present

## 2024-01-07 DIAGNOSIS — M25551 Pain in right hip: Secondary | ICD-10-CM | POA: Diagnosis present

## 2024-01-07 DIAGNOSIS — Z7401 Bed confinement status: Secondary | ICD-10-CM | POA: Diagnosis not present

## 2024-01-07 LAB — BASIC METABOLIC PANEL WITH GFR
Anion gap: 15 (ref 5–15)
Anion gap: 11 (ref 5–15)
BUN: 20 mg/dL (ref 8–23)
BUN: 15 mg/dL (ref 8–23)
CO2: 21 mmol/L — ABNORMAL LOW (ref 22–32)
CO2: 22 mmol/L (ref 22–32)
Calcium: 7.9 mg/dL — ABNORMAL LOW (ref 8.9–10.3)
Calcium: 7.7 mg/dL — ABNORMAL LOW (ref 8.9–10.3)
Chloride: 103 mmol/L (ref 98–111)
Chloride: 103 mmol/L (ref 98–111)
Creatinine, Ser: 0.64 mg/dL (ref 0.44–1.00)
Creatinine, Ser: 0.71 mg/dL (ref 0.44–1.00)
GFR, Estimated: >60 mL/min (ref >60)
GFR, Estimated: >60 mL/min (ref >60)
Glucose, Bld: 98 mg/dL (ref 70–99)
Glucose, Bld: 123 mg/dL — ABNORMAL HIGH (ref 70–99)
Potassium: 2.8 mmol/L — ABNORMAL LOW (ref 3.5–5.1)
Potassium: 3.2 mmol/L — ABNORMAL LOW (ref 3.5–5.1)
Sodium: 139 mmol/L (ref 135–145)
Sodium: 136 mmol/L (ref 135–145)

## 2024-01-07 LAB — CBC WITH DIFFERENTIAL/PLATELET
Abs Immature Granulocytes: 0.03 K/uL (ref 0.00–0.07)
Basophils Absolute: 0 K/uL (ref 0.0–0.1)
Basophils Relative: 0 %
Eosinophils Absolute: 0 K/uL (ref 0.0–0.5)
Eosinophils Relative: 0 %
HCT: 33 % — ABNORMAL LOW (ref 36.0–46.0)
Hemoglobin: 10.6 g/dL — ABNORMAL LOW (ref 12.0–15.0)
Immature Granulocytes: 0 %
Lymphocytes Relative: 11 %
Lymphs Abs: 0.9 K/uL (ref 0.7–4.0)
MCH: 29.2 pg (ref 26.0–34.0)
MCHC: 32.1 g/dL (ref 30.0–36.0)
MCV: 90.9 fL (ref 80.0–100.0)
Monocytes Absolute: 0.4 K/uL (ref 0.1–1.0)
Monocytes Relative: 5 %
Neutro Abs: 7.3 K/uL (ref 1.7–7.7)
Neutrophils Relative %: 84 %
Platelets: 242 K/uL (ref 150–400)
RBC: 3.63 MIL/uL — ABNORMAL LOW (ref 3.87–5.11)
RDW: 14.6 % (ref 11.5–15.5)
WBC: 8.8 K/uL (ref 4.0–10.5)
nRBC: 0 % (ref 0.0–0.2)

## 2024-01-07 LAB — MAGNESIUM: Magnesium: 2 mg/dL (ref 1.7–2.4)

## 2024-01-07 MED ORDER — MORPHINE SULFATE (PF) 2 MG/ML IV SOLN
2.0000 mg | INTRAVENOUS | Status: DC | PRN
  Administered 2024-01-07: 2 mg via INTRAVENOUS
  Filled 2024-01-07: qty 1

## 2024-01-07 MED ORDER — METHOCARBAMOL 1000 MG/10ML IJ SOLN: 500.0000 mg | Freq: Four times a day (QID) | INTRAMUSCULAR | Status: DC | PRN

## 2024-01-07 MED ORDER — VALACYCLOVIR HCL 500 MG PO TABS
500.0000 mg | ORAL_TABLET | Freq: Two times a day (BID) | ORAL | Status: DC
  Administered 2024-01-07 – 2024-01-14 (×13): 500 mg via ORAL
  Filled 2024-01-07 (×13): qty 1

## 2024-01-07 MED ORDER — HYDROCHLOROTHIAZIDE 25 MG PO TABS: 25.0000 mg | ORAL_TABLET | Freq: Every day | ORAL | Status: DC

## 2024-01-07 MED ORDER — POLYETHYLENE GLYCOL 3350 17 G PO PACK: 17.0000 g | PACK | Freq: Every day | ORAL | Status: DC | PRN

## 2024-01-07 MED ORDER — DOCUSATE SODIUM 100 MG PO CAPS
100.0000 mg | ORAL_CAPSULE | Freq: Two times a day (BID) | ORAL | Status: DC
  Administered 2024-01-08 – 2024-01-12 (×9): 100 mg via ORAL
  Filled 2024-01-07 (×12): qty 1

## 2024-01-07 MED ORDER — LENALIDOMIDE 20 MG PO CAPS
20.0000 mg | ORAL_CAPSULE | Freq: Every day | ORAL | Status: DC
  Administered 2024-01-13 – 2024-01-14 (×2): 20 mg via ORAL
  Filled 2024-01-07 (×3): qty 1

## 2024-01-07 MED ORDER — BISACODYL 5 MG PO TBEC: 5.0000 mg | DELAYED_RELEASE_TABLET | Freq: Every day | ORAL | Status: DC | PRN

## 2024-01-07 MED ORDER — POTASSIUM CHLORIDE 10 MEQ/100ML IV SOLN
10.0000 meq | INTRAVENOUS | Status: AC
  Administered 2024-01-07 (×2): 10 meq via INTRAVENOUS
  Filled 2024-01-07 (×2): qty 100

## 2024-01-07 MED ORDER — HYDROMORPHONE HCL 1 MG/ML IJ SOLN
1.0000 mg | Freq: Once | INTRAMUSCULAR | Status: AC
  Administered 2024-01-07: 1 mg via INTRAVENOUS
  Filled 2024-01-07: qty 1

## 2024-01-07 MED ORDER — ACETAMINOPHEN 325 MG PO TABS: 650.0000 mg | ORAL_TABLET | Freq: Four times a day (QID) | ORAL | Status: DC | PRN

## 2024-01-07 MED ORDER — ONDANSETRON HCL 4 MG/2ML IJ SOLN: 4.0000 mg | Freq: Four times a day (QID) | INTRAMUSCULAR | Status: DC | PRN

## 2024-01-07 MED ORDER — MONTELUKAST SODIUM 10 MG PO TABS
10.0000 mg | ORAL_TABLET | Freq: Every day | ORAL | Status: DC
  Administered 2024-01-07 – 2024-01-13 (×7): 10 mg via ORAL
  Filled 2024-01-07 (×7): qty 1

## 2024-01-07 MED ORDER — POTASSIUM CHLORIDE CRYS ER 20 MEQ PO TBCR
40.0000 meq | EXTENDED_RELEASE_TABLET | Freq: Once | ORAL | Status: AC
Start: 2024-01-07 — End: 2024-01-07
  Administered 2024-01-07: 40 meq via ORAL
  Filled 2024-01-07: qty 2

## 2024-01-07 MED ORDER — METHOCARBAMOL 500 MG PO TABS
500.0000 mg | ORAL_TABLET | Freq: Four times a day (QID) | ORAL | Status: DC | PRN
  Administered 2024-01-13 – 2024-01-14 (×3): 500 mg via ORAL
  Filled 2024-01-07 (×3): qty 1

## 2024-01-07 MED ORDER — OXYCODONE HCL 5 MG PO TABS
5.0000 mg | ORAL_TABLET | ORAL | Status: DC | PRN
  Administered 2024-01-07 – 2024-01-13 (×18): 10 mg via ORAL
  Filled 2024-01-07 (×19): qty 2

## 2024-01-07 MED ORDER — POTASSIUM CHLORIDE 10 MEQ/100ML IV SOLN
10.0000 meq | INTRAVENOUS | Status: AC
  Administered 2024-01-07: 10 meq via INTRAVENOUS
  Filled 2024-01-07 (×3): qty 100

## 2024-01-07 MED ORDER — MORPHINE SULFATE (PF) 4 MG/ML IV SOLN
4.0000 mg | Freq: Once | INTRAVENOUS | Status: AC
  Administered 2024-01-07: 4 mg via INTRAVENOUS
  Filled 2024-01-07: qty 1

## 2024-01-07 NOTE — Progress Notes (Signed)
 Patient ID: Wanda Newton, female   DOB: [birth year], 73 y.o.   MRN: [id number]  Consult received. Plan IMN tomorrow with Dr. Celena at [facility]. Please transfer and keep NPO after MN.    Wanda DOROTHA Ned, PA-C Orthopedic Surgery [phone]

## 2024-01-07 NOTE — ED Provider Notes (Signed)
 [place] EMERGENCY DEPARTMENT AT [facility] Provider Note   CSN: [id number] Arrival date & time: 01/07/24  0602     Patient presents with: Hip Pain   Wanda Newton is a 73 y.o. female.   Hip Pain  Patient is a 73 year old female presenting to ED today with concerns for acute onset right femur pain.  Previous medical history of multiple myeloma currently undergoing infusions with last infusion on 10/13, diabetes, HTN.  Reports that she initially called EMS secondary to pain and when EMS was in her, she felt a pop in her right femur as well as feeling obvious deformity afterward.  States that she has known lesion to right femur.  Notes that pain is controlled currently.  Denies numbness, tingling, fever, headache, vision use, chest pain, shortness of breath, abdominal pain, back pain, flank pain, dysuria.     Prior to Admission medications   Medication Sig Start Date End Date Taking? Authorizing Provider  ASPIRIN  LOW DOSE 81 MG tablet Take 81 mg by mouth daily.   Yes [provider]  dexamethasone  (DECADRON ) 4 MG tablet Take 20 mg by mouth See admin instructions.  Take five tablets (20 mg dose) by mouth once a week. Take on days 1, 2, 8, 9, 15, 16 each 21 day cycle for cycles 1 through 8. 01/03/24  Yes [provider]  hydrochlorothiazide  (HYDRODIURIL ) 50 MG tablet Take 1 tablet (50 mg total) by mouth daily. 12/15/17  Yes Sagardia, Emil Schanz, MD  meclizine  (ANTIVERT ) 25 MG tablet Take 1 tablet (25 mg total) by mouth 3 (three) times daily as needed for dizziness. 09/23/16  Yes Weber, Sarah L, PA-C  montelukast (SINGULAIR) 10 MG tablet Take 10 mg by mouth at bedtime. 01/01/24  Yes [provider]  ondansetron  (ZOFRAN ) 4 MG tablet Take 4 mg by mouth every 8 (eight) hours as needed for vomiting or nausea. 01/01/24  Yes [provider]  Potassium 99 MG TABS Take 99 mg by mouth in the morning and at bedtime.   Yes [provider]   Red Yeast Rice Extract (RED YEAST RICE PO) Take 1 capsule by mouth in the morning and at bedtime.   Yes [provider]  REVLIMID 20 MG capsule Take 20 mg by mouth daily. 01/01/24  Yes [provider]  traMADol  (ULTRAM ) 50 MG tablet Take 1-2 tablets (50-100 mg total) by mouth every 6 (six) hours as needed. Patient taking differently: Take 50-100 mg by mouth every 6 (six) hours as needed for moderate pain (pain score 4-6). 12/08/23  Yes Neomi Johnston DASEN, PA-C  valACYclovir (VALTREX) 500 MG tablet Take 500 mg by mouth 2 (two) times daily. 01/01/24  Yes [provider]    Allergies: Red dye #40 (allura red) and Amlodipine     Review of Systems  Musculoskeletal:  Positive for arthralgias and myalgias.  All other systems reviewed and are negative.   Updated Vital Signs BP 125/61 (BP Location: Right Arm)   Pulse (!) 55   Temp 97.8 F (36.6 C) (Oral)   Resp 17   Ht 5' 5 (1.651 m)   Wt 90.7 kg   SpO2 100%   BMI 33.28 kg/m   Physical Exam Vitals and nursing note reviewed.  Constitutional:      General: She is not in acute distress.    Appearance: Normal appearance. She is not ill-appearing or diaphoretic.  HENT:     Head: Normocephalic and atraumatic.  Eyes:     General:  No scleral icterus.       Right eye: No discharge.        Left eye: No discharge.     Extraocular Movements: Extraocular movements intact.     Conjunctiva/sclera: Conjunctivae normal.  Neck:     Comments: No midline tenderness to palpation of the cervical, thoracic, lumbar spine. Cardiovascular:     Rate and Rhythm: Normal rate and regular rhythm.     Pulses: Normal pulses.     Heart sounds: Normal heart sounds. No murmur heard.    No friction rub. No gallop.  Pulmonary:     Effort: Pulmonary effort is normal. No respiratory distress.     Breath sounds: No stridor. No wheezing, rhonchi or rales.  Chest:     Chest wall: No tenderness.  Abdominal:     General: Abdomen is flat. There  is no distension.     Palpations: Abdomen is soft.     Tenderness: There is no abdominal tenderness. There is no right CVA tenderness, left CVA tenderness, guarding or rebound.  Musculoskeletal:        General: Deformity and signs of injury present. No swelling.     Cervical back: Normal range of motion. No rigidity.     Right lower leg: No edema.     Left lower leg: No edema.     Comments: Obvious deformity to right lateral thigh.  Neurovascular intact, DP pulse 2+ normal sensation dermatomes on right leg.  No other injuries noted otherwise.  Skin:    General: Skin is warm and dry.     Capillary Refill: Capillary refill takes less than 2 seconds.     Findings: No bruising, erythema or lesion.  Neurological:     General: No focal deficit present.     Mental Status: She is alert and oriented to person, place, and time. Mental status is at baseline.     Sensory: No sensory deficit.     Motor: No weakness.  Psychiatric:        Mood and Affect: Mood normal.     (all labs ordered are listed, but only abnormal results are displayed) Labs Reviewed  CBC WITH DIFFERENTIAL/PLATELET - Abnormal; Notable for the following components:      Result Value   RBC 3.63 (*)    Hemoglobin 10.6 (*)    HCT 33.0 (*)    All other components within normal limits  BASIC METABOLIC PANEL WITH GFR - Abnormal; Notable for the following components:   Potassium 2.8 (*)    CO2 21 (*)    Calcium  7.9 (*)    All other components within normal limits  MAGNESIUM     EKG: EKG Interpretation Date/Time:  Thursday January 07 2024 10:13:13 EDT Ventricular Rate:  63 PR Interval:  160 QRS Duration:  103 QT Interval:  492 QTC Calculation: 504 R Axis:   17  Text Interpretation: Sinus arrhythmia Abnormal R-wave progression, early transition Slightly prolonged QTc Compared with prior EKG from 03/11/2016 Confirmed by Gennaro Bouchard ([id number]) on 01/07/2024 10:17:47 AM  Radiology: ARCOLA Femur Portable Min 2 Views  Right Result Date: 01/07/2024 EXAM: 2 VIEW(S) XRAY OF THE RIGHT FEMUR 01/07/2024 09:05:00 AM COMPARISON: Right femur series 12/02/2023. CLINICAL HISTORY: R leg pain and deformity with known multiple myeloma. Increasing right hip pain, felt a pop in right hip with deformity. FINDINGS: BONES AND JOINTS: Comminuted pathologic fracture of the proximal third right femur shaft, corresponding to the area of 7 to 8 cm lytic lesion of the proximal  shaft last month. 30 degree varus angulation of the distal fragment and 1 full shaft width medial displacement. 1 half shaft width posterior displacement. Right total knee arthroplasty is visible, appears to remain intact and aligned. Other heterogeneous bone mineralization about the right hip is stable. No joint dislocation. SOFT TISSUES: The soft tissues are unremarkable. IMPRESSION: 1. Comminuted and angulated pathologic fracture of the proximal right femoral shaft site of lytic bone lesion. Electronically signed by: Helayne Hurst MD 01/07/2024 09:19 AM EDT RP Workstation: [id number]    Procedures   Medications Ordered in the ED  potassium chloride  10 mEq in 100 mL IVPB (0 mEq Intravenous Stopped 01/07/24 1135)  valACYclovir (VALTREX) tablet 500 mg (has no administration in time range)  hydrochlorothiazide  (HYDRODIURIL ) tablet 25 mg (has no administration in time range)  montelukast (SINGULAIR) tablet 10 mg (has no administration in time range)  morphine (PF) 2 MG/ML injection 2 mg (has no administration in time range)  methocarbamol  (ROBAXIN ) tablet 500 mg (has no administration in time range)    Or  methocarbamol  (ROBAXIN ) injection 500 mg (has no administration in time range)  docusate sodium  (COLACE) capsule 100 mg (has no administration in time range)  polyethylene glycol (MIRALAX  / GLYCOLAX ) packet 17 g (has no administration in time range)  bisacodyl  (DULCOLAX) EC tablet 5 mg (has no administration in time range)  oxyCODONE  (Oxy IR/ROXICODONE ) immediate  release tablet 5-10 mg (has no administration in time range)  acetaminophen  (TYLENOL ) tablet 650 mg (has no administration in time range)  ondansetron  (ZOFRAN ) injection 4 mg (has no administration in time range)  lenalidomide (REVLIMID) capsule 20 mg (has no administration in time range)  potassium chloride  SA (KLOR-CON  M) CR tablet 40 mEq (has no administration in time range)  morphine (PF) 4 MG/ML injection 4 mg (4 mg Intravenous Given 01/07/24 0730)  HYDROmorphone  (DILAUDID ) injection 1 mg (1 mg Intravenous Given 01/07/24 0833)  HYDROmorphone  (DILAUDID ) injection 1 mg (1 mg Intravenous Given 01/07/24 0921)    Clinical Course as of 01/07/24 1448  Thu Jan 07, 2024  1331 Spoke with Dr. Delon Bolder, hospitalist with patient care transferred over to her at this time. [CB]    Clinical Course User Index [CB] Beola Terrall RAMAN, PA-C   Medical Decision Making Amount and/or Complexity of Data Reviewed Labs: ordered. Radiology: ordered. ECG/medicine tests: ordered.  Risk Prescription drug management. Decision regarding hospitalization.  This patient is a 73 year old female who presents to the ED for concern of right thigh pain, notably her pop while EMS was attempting to lift her into a wheelchair with known multiple myeloma lesion to right thigh for which she is currently undergoing chemotherapy with last infusion on 10/13.  Notably had radiation to right femur previously followed by PET scan.  On physical exam, patient is in no acute distress, afebrile, alert and orient x 4, speaking in full sentences, nontachypneic, nontachycardic.  Obvious deformity to right femur, with no signs of puncture.  No signs of crepitus.  Neurovascular intact, DP pulse 2+ with appropriate sensation over the foot, shin, calf.  CMP notes hypokalemia of 2.8, with potassium provided to treat.  Also noting hypocalcemia.  X-ray noted comminuted displaced fracture of right femur.  Secondary to lytic  lesion.  Pain controlled with Dilaudid .  Spoke with Wanda Purchase, PA-C with orthopedic surgery who is attempting to find orthopedic surgeon to operate on her leg, wish to have her admitted to medicine in the interim.  Patient care transferred to Wanda Newton with internal medicine.  Differential diagnoses prior to evaluation: The emergent differential diagnosis includes, but is not limited to, fracture, ligamentous injury, neurovascular injury, dislocation, malalignment, multiple myeloma lesion. This is not an exhaustive differential.   Past Medical History / Co-morbidities / Social History: HTN, diabetes, arthritis, multiple myeloma  Additional history: Chart reviewed. Pertinent results include:   Last seen by hematology/oncology on 01/04/2024 for multiple myeloma and hypercalcemia.  Noted to currently undergoing chemotherapy.  Noted to have had PET scan with lesion noted to right leg.  Last infusion of chemotherapy on 10/13.  Lab Tests/Imaging studies: I personally interpreted labs/imaging and the pertinent results include:   CBC notes a hemoglobin 10.6 BMP notes a hypokalemia of 2.8 as well as hypocalcemia 7.9 but otherwise unremarkable.  Magnesium  unremarkable.  X-ray of right versus comminuted fracture of right femur shaft, displaced  I agree with the radiologist interpretation.  Cardiac monitoring: EKG obtained and interpreted by myself and attending physician which shows: Sinus arrhythmia, slightly prolonged QT  EKG Interpretation Date/Time:  Thursday January 07 2024 10:13:13 EDT Ventricular Rate:  63 PR Interval:  160 QRS Duration:  103 QT Interval:  492 QTC Calculation: 504 R Axis:   17  Text Interpretation: Sinus arrhythmia Abnormal R-wave progression, early transition Slightly prolonged QTc Compared with prior EKG from 03/11/2016 Confirmed by Gennaro Bouchard ([id number]) on 01/07/2024 10:17:47 AM          Medications: I ordered medication including potassium,  Dilaudid , morphine.  I have reviewed the patients home medicines and have made adjustments as needed.  Critical Interventions: None  Social Determinants of Health: None  Disposition: After consideration of the diagnostic results and the patients response to treatment, I feel that the patient would benefit from admission, pending orthopedic evaluation for likely surgery.  Patient care turned over to Wanda Newton.  Final diagnoses:  Multiple myeloma not having achieved remission (HCC)  Closed displaced comminuted fracture of shaft of right femur, initial encounter [facility])  Hypokalemia  Hypocalcemia    ED Discharge Orders     None          Beola Terrall RAMAN, PA-C 01/07/24 1448    Gennaro Bouchard CROME, DO 01/10/24 [phone]

## 2024-01-07 NOTE — ED Triage Notes (Signed)
 BIBA from home, increasing right hip pain over the last couple days worsening today.  Felt a pop in right hip with some deformity when moving from bed to wheelchair. Pt denies fall  Hx multiple myeloma   Ems: 200 mcg Fentanyl    160/70 HR 80 RR 20 98% RA

## 2024-01-07 NOTE — H&P (Signed)
 History and Physical    Patient: Wanda Newton FMW:[id number] DOB: 16-Apr-1950 DOA: 01/07/2024 DOS: the patient was seen and examined on 01/07/2024 PCP: Allen Lauraine LITTIE, PA-C  Patient coming from: Home - lives alone; NOK: Darin Veva Breslow, [phone]   Chief Complaint: Hip pain  HPI: Wanda Newton is a 73 y.o. female with medical history significant of DM, multiple myeloma, and HTN who presented on 10/16 with hip pain after moving from bed to wheelchair.  She reports that she was recently diagnosed with multiple myeloma with issues in her R leg.  EMS moved her to a wheelchair and it very weak and broke.  She called EMS because her feet were swelling and she couldn't walk.  She felt the leg pop.  Multiple myeloma was diagnosed 9/10 - she was going to her dentist but felt bad because her leg was excruciatingly painful.  Xrays and CT led to diagnose.  She just startd immunotherapy at [facility] on 10/13.  Pain controlled right now.    ER Course:  Displaced R femur fracture due to lytic lesion.  Seeing Dr. Laurence for multiple myeloma.  Dr. Fidel is unavailable (in OR).  Toni Purchase is trying to find a Careers adviser.  Pain is controlled, not currently in traction.  Hypokalemia, repleting.     Review of Systems: As mentioned in the history of present illness. All other systems reviewed and are negative. Past Medical History:  Diagnosis Date   Allergy    Arthritis    Blood transfusion without reported diagnosis    Complication of anesthesia    Hard time waking up.   Diabetes mellitus without complication (HCC)    pre-diabetes- Patient states no pre-diabetes   Hypertension    Vertigo    Past Surgical History:  Procedure Laterality Date   ABDOMINAL HYSTERECTOMY     FOOT SURGERY     x3   TONSILLECTOMY     TOTAL KNEE ARTHROPLASTY Right 03/18/2016   Procedure: TOTAL KNEE ARTHROPLASTY;  Surgeon: Donnice Car, MD;  Location: [facility] ORS;  Service: Orthopedics;  Laterality: Right;   TOTAL  KNEE ARTHROPLASTY Left 11/18/2022   Procedure: LEFT TOTAL KNEE ARTHROPLASTY;  Surgeon: Car Donnice, MD;  Location: [facility] ORS;  Service: Orthopedics;  Laterality: Left;   Social History:  reports that she quit smoking about 22 years ago. Her smoking use included cigarettes. She has never used smokeless tobacco. She reports that she does not drink alcohol and does not use drugs.  Allergies  Allergen Reactions   Red Dye #40 (Allura Red) Shortness Of Breath    Patient states this reaction ONLY occurred with BENADRYL  TABLET   Amlodipine  Swelling    Bilateral LE swelling with medication.     Family History  Problem Relation Age of Onset   Diabetes Mother    Hypertension Mother    Diabetes Brother    Hypertension Brother    Diabetes Sister    Mental illness Sister 74       schizophrenia   Gout Brother     Prior to Admission medications   Medication Sig Start Date End Date Taking? Authorizing Provider  hydrochlorothiazide  (HYDRODIURIL ) 50 MG tablet Take 1 tablet (50 mg total) by mouth daily. 12/15/17   Purcell Emil Schanz, MD  meclizine  (ANTIVERT ) 25 MG tablet Take 1 tablet (25 mg total) by mouth 3 (three) times daily as needed for dizziness. 09/23/16   Weber, Lauraine LITTIE, PA-C  methocarbamol  (ROBAXIN ) 500 MG tablet Take 1 tablet (500 mg total)  by mouth every 6 (six) hours as needed for muscle spasms (muscle pain). Patient not taking: Reported on 12/04/2023 11/19/22   Patti Rosina SAUNDERS, PA-C  Potassium 99 MG TABS Take 99 mg by mouth in the morning and at bedtime.    [provider]  Red Yeast Rice Extract (RED YEAST RICE PO) Take 1 capsule by mouth in the morning and at bedtime.    [provider]  traMADol  (ULTRAM ) 50 MG tablet Take 1-2 tablets (50-100 mg total) by mouth every 6 (six) hours as needed. 12/08/23   Neomi Johnston DASEN, PA-C    Physical Exam: Vitals:   01/07/24 0617 01/07/24 0619 01/07/24 1000 01/07/24 1327  BP: 126/60  125/64 125/61  Pulse: 70  61 (!) 55  Resp: 18   20 17   Temp: 98.1 F (36.7 C)  97.7 F (36.5 C) 97.8 F (36.6 C)  TempSrc:   Oral Oral  SpO2: 99%  96% 100%  Weight:  90.7 kg    Height:  5' 5 (1.651 m)     General:  Appears calm and comfortable and is in NAD; hip pain with movement Eyes:  EOMI, normal lids, iris ENT:  grossly normal hearing,  grossly normal lips & tongue, mmm Neck:  no LAD, masses or thyromegaly Cardiovascular:  RRR, no m/r/g. No LE edema.  Respiratory:   CTA bilaterally with no wheezes/rales/rhonchi.  Normal respiratory effort.   Abdomen:  soft, NT, ND Skin:  no rash or induration seen on limited exam Musculoskeletal:  R leg with mid-thigh deformity Lower extremity:  No LE edema.  Limited foot exam with no ulcerations.  2+ distal pulses. Psychiatric:  grossly normal mood and affect, speech fluent and appropriate, A&O x 3 Neurologic:  CN 2-12 grossly intact, moves all extremities in coordinated fashion other than RLE   Radiological Exams on Admission: Independently reviewed - see discussion in A/P where applicable  DG Femur Portable Min 2 Views Right Result Date: 01/07/2024 EXAM: 2 VIEW(S) XRAY OF THE RIGHT FEMUR 01/07/2024 09:05:00 AM COMPARISON: Right femur series 12/02/2023. CLINICAL HISTORY: R leg pain and deformity with known multiple myeloma. Increasing right hip pain, felt a pop in right hip with deformity. FINDINGS: BONES AND JOINTS: Comminuted pathologic fracture of the proximal third right femur shaft, corresponding to the area of 7 to 8 cm lytic lesion of the proximal shaft last month. 30 degree varus angulation of the distal fragment and 1 full shaft width medial displacement. 1 half shaft width posterior displacement. Right total knee arthroplasty is visible, appears to remain intact and aligned. Other heterogeneous bone mineralization about the right hip is stable. No joint dislocation. SOFT TISSUES: The soft tissues are unremarkable. IMPRESSION: 1. Comminuted and angulated pathologic fracture of the  proximal right femoral shaft site of lytic bone lesion. Electronically signed by: Helayne Hurst MD 01/07/2024 09:19 AM EDT RP Workstation: [id number]    EKG: Independently reviewed.  Sinus arrhythmia with rate 63; prolonged QTc 504; nonspecific ST changes with no evidence of acute ischemia   Labs on Admission: I have personally reviewed the available labs and imaging studies at the time of the admission.  Pertinent labs:    K+ 2.8 WBC 8.8 Hgb 10.6  Assessment and Plan: Principal Problem:   Pathological fracture in neoplastic disease, right femur, initial encounter for fracture [facility]) Active Problems:   Benign essential HTN   Obesity, Class II, BMI 35-39.9   Multiple myeloma (HCC)   Anemia of chronic disease    R pathologic  femur shaft fracture Known lytic lesion of the R femur Too weak to moved, called EMS to get her into a wheelchair and felt it pop Comminuted pathologic fracture of the R femur shaft Orthopedics consulted NPO in anticipation of surgical repair - today vs. Tomorrow, [facility] vs. [facility] TBD SCDs overnight, start Lovenox post-operatively (or as per ortho) Pain control with Tylenol , Robaxin , Oxycodone , and Morphine prn TOC team consult for rehab placement Will need PT consult post-operatively Hip fracture order set utilized Fascia iliacus block per anesthesia  Pre-operative stratification Orthopedic/spinal surgery is associated with an intermediate (1-5%) cardiovascular risk for cardiac death and nonfatal MI She has no significant risk factors to prevent surgery noted at this time Iit is reasonable for her to go to the OR without additional evaluation  Multiple myeloma Initial treatment with daratumumab, Revlimid, Velcase, and dexamethasone  on 10/13 She is due for weekly immunotherapy treatments but this may be delayed given current situation Continue daily Revlimid Also on Zoledronic acid q4 weeks Followed by Dr. Laurence from [place]  Anemia associated with multiple  myeloma Hgb 11.3 on 10/13, now 10/6 Will monitor Hold ASA for now Transfuse for Hgb <7  Hypokalemia Replete Likely related to high-dose HCTZ  HTN Continue HTCZ - will decrease dose to 25 mg daily (50 mg dose increases side effects without significant positive effects)  Obesity Body mass index is 33.28 kg/m.SABRA  Weight loss should be encouraged Outpatient PCP/bariatric medicine f/u encouraged Significantly low or high BMI is associated with higher medical risk including morbidity and mortality      Advance Care Planning:   Code Status: Full Code   Consults: Orthopedics, nutrition, TOC team; will need PT post-operatively  DVT Prophylaxis: SCDs  Family Communication: Sister and friend present throughout evaluation  Severity of Illness: The appropriate patient status for this patient is INPATIENT. Inpatient status is judged to be reasonable and necessary in order to provide the required intensity of service to ensure the patient's safety. The patient's presenting symptoms, physical exam findings, and initial radiographic and laboratory data in the context of their chronic comorbidities is felt to place them at high risk for further clinical deterioration. Furthermore, it is not anticipated that the patient will be medically stable for discharge from the hospital within 2 midnights of admission.   * I certify that at the point of admission it is my clinical judgment that the patient will require inpatient hospital care spanning beyond 2 midnights from the point of admission due to high intensity of service, high risk for further deterioration and high frequency of surveillance required.*  Author: Delon Herald, MD 01/07/2024 2:20 PM  For on call review [email or web address].

## 2024-01-07 NOTE — ED Notes (Signed)
 EKG given to Dr.Kammerer

## 2024-01-08 ENCOUNTER — Encounter (HOSPITAL_COMMUNITY): Payer: Self-pay | Admitting: Internal Medicine

## 2024-01-08 ENCOUNTER — Inpatient Hospital Stay (HOSPITAL_COMMUNITY)

## 2024-01-08 ENCOUNTER — Inpatient Hospital Stay (HOSPITAL_COMMUNITY): Admitting: Certified Registered Nurse Anesthetist

## 2024-01-08 ENCOUNTER — Encounter (HOSPITAL_COMMUNITY)
Admission: EM | Disposition: A | Discharge status: Home Health | Payer: Self-pay | Source: Home / Self Care | Attending: Family Medicine

## 2024-01-08 ENCOUNTER — Other Ambulatory Visit: Payer: Self-pay

## 2024-01-08 DIAGNOSIS — E119 Type 2 diabetes mellitus without complications: Secondary | ICD-10-CM | POA: Diagnosis not present

## 2024-01-08 DIAGNOSIS — S7291XA Unspecified fracture of right femur, initial encounter for closed fracture: Secondary | ICD-10-CM | POA: Diagnosis not present

## 2024-01-08 DIAGNOSIS — I10 Essential (primary) hypertension: Secondary | ICD-10-CM

## 2024-01-08 DIAGNOSIS — M84551A Pathological fracture in neoplastic disease, right femur, initial encounter for fracture: Secondary | ICD-10-CM | POA: Diagnosis not present

## 2024-01-08 DIAGNOSIS — Z87891 Personal history of nicotine dependence: Secondary | ICD-10-CM

## 2024-01-08 HISTORY — PX: FEMUR IM NAIL: SHX1597

## 2024-01-08 LAB — CBC WITH DIFFERENTIAL/PLATELET
Abs Immature Granulocytes: 0.02 K/uL (ref 0.00–0.07)
Basophils Absolute: 0 K/uL (ref 0.0–0.1)
Basophils Relative: 0 %
Eosinophils Absolute: 0.2 K/uL (ref 0.0–0.5)
Eosinophils Relative: 2 %
HCT: 32.5 % — ABNORMAL LOW (ref 36.0–46.0)
Hemoglobin: 10.5 g/dL — ABNORMAL LOW (ref 12.0–15.0)
Immature Granulocytes: 0 %
Lymphocytes Relative: 11 %
Lymphs Abs: 0.8 K/uL (ref 0.7–4.0)
MCH: 29.7 pg (ref 26.0–34.0)
MCHC: 32.3 g/dL (ref 30.0–36.0)
MCV: 91.8 fL (ref 80.0–100.0)
Monocytes Absolute: 0.5 K/uL (ref 0.1–1.0)
Monocytes Relative: 8 %
Neutro Abs: 5.4 K/uL (ref 1.7–7.7)
Neutrophils Relative %: 79 %
Platelets: 239 K/uL (ref 150–400)
RBC: 3.54 MIL/uL — ABNORMAL LOW (ref 3.87–5.11)
RDW: 14.9 % (ref 11.5–15.5)
WBC: 6.8 K/uL (ref 4.0–10.5)
nRBC: 0 % (ref 0.0–0.2)

## 2024-01-08 LAB — COMPREHENSIVE METABOLIC PANEL WITH GFR
ALT: 21 U/L (ref 0–44)
AST: 34 U/L (ref 15–41)
Albumin: 2.7 g/dL — ABNORMAL LOW (ref 3.5–5.0)
Alkaline Phosphatase: 53 U/L (ref 38–126)
Anion gap: 12 (ref 5–15)
BUN: 15 mg/dL (ref 8–23)
CO2: 23 mmol/L (ref 22–32)
Calcium: 7.8 mg/dL — ABNORMAL LOW (ref 8.9–10.3)
Chloride: 104 mmol/L (ref 98–111)
Creatinine, Ser: 0.74 mg/dL (ref 0.44–1.00)
GFR, Estimated: >60 mL/min (ref >60)
Glucose, Bld: 129 mg/dL — ABNORMAL HIGH (ref 70–99)
Potassium: 4.2 mmol/L (ref 3.5–5.1)
Sodium: 139 mmol/L (ref 135–145)
Total Bilirubin: 0.7 mg/dL (ref 0.0–1.2)
Total Protein: 7.5 g/dL (ref 6.5–8.1)

## 2024-01-08 LAB — MRSA NEXT GEN BY PCR, NASAL: MRSA by PCR Next Gen: NOT DETECTED

## 2024-01-08 LAB — GLUCOSE, CAPILLARY: Glucose-Capillary: 161 mg/dL — ABNORMAL HIGH (ref 70–99)

## 2024-01-08 LAB — MAGNESIUM: Magnesium: 2.4 mg/dL (ref 1.7–2.4)

## 2024-01-08 SURGERY — INSERTION, INTRAMEDULLARY ROD, FEMUR
Anesthesia: General | Laterality: Right

## 2024-01-08 MED ORDER — LACTATED RINGERS IV SOLN: INTRAVENOUS | Status: DC | PRN

## 2024-01-08 MED ORDER — CHLORHEXIDINE GLUCONATE 0.12 % MT SOLN: 15.0000 mL | Freq: Once | OROMUCOSAL | Status: DC

## 2024-01-08 MED ORDER — ORAL CARE MOUTH RINSE
15.0000 mL | Freq: Once | OROMUCOSAL | Status: AC
  Administered 2024-01-08: 15 mL via OROMUCOSAL

## 2024-01-08 MED ORDER — ORAL CARE MOUTH RINSE: 15.0000 mL | Freq: Once | OROMUCOSAL | Status: DC

## 2024-01-08 MED ORDER — ROCURONIUM BROMIDE 10 MG/ML (PF) SYRINGE
PREFILLED_SYRINGE | INTRAVENOUS | Status: DC | PRN
  Administered 2024-01-08: 60 mg via INTRAVENOUS
  Administered 2024-01-08: 20 mg via INTRAVENOUS

## 2024-01-08 MED ORDER — ACETAMINOPHEN 10 MG/ML IV SOLN
INTRAVENOUS | Status: AC
  Filled 2024-01-08: qty 100

## 2024-01-08 MED ORDER — CHLORHEXIDINE GLUCONATE 4 % EX SOLN: 60.0000 mL | Freq: Once | CUTANEOUS | Status: DC

## 2024-01-08 MED ORDER — FENTANYL CITRATE (PF) 250 MCG/5ML IJ SOLN
INTRAMUSCULAR | Status: DC | PRN
  Administered 2024-01-08: 100 ug via INTRAVENOUS

## 2024-01-08 MED ORDER — DEXAMETHASONE SOD PHOSPHATE PF 10 MG/ML IJ SOLN
INTRAMUSCULAR | Status: DC | PRN
  Administered 2024-01-08: 5 mg via INTRAVENOUS

## 2024-01-08 MED ORDER — ENOXAPARIN SODIUM 40 MG/0.4ML IJ SOSY
40.0000 mg | PREFILLED_SYRINGE | INTRAMUSCULAR | Status: DC
  Administered 2024-01-09 – 2024-01-13 (×5): 40 mg via SUBCUTANEOUS
  Filled 2024-01-08 (×5): qty 0.4

## 2024-01-08 MED ORDER — ACETAMINOPHEN 10 MG/ML IV SOLN
INTRAVENOUS | Status: DC | PRN
  Administered 2024-01-08: 1000 mg via INTRAVENOUS

## 2024-01-08 MED ORDER — SUGAMMADEX SODIUM 200 MG/2ML IV SOLN
INTRAVENOUS | Status: DC | PRN
  Administered 2024-01-08: 181.4 mg via INTRAVENOUS

## 2024-01-08 MED ORDER — POVIDONE-IODINE 10 % EX SWAB: 2.0000 | Freq: Once | CUTANEOUS | Status: DC

## 2024-01-08 MED ORDER — PROPOFOL 10 MG/ML IV BOLUS
INTRAVENOUS | Status: DC | PRN
  Administered 2024-01-08: 170 mg via INTRAVENOUS

## 2024-01-08 MED ORDER — AMISULPRIDE (ANTIEMETIC) 5 MG/2ML IV SOLN: 10.0000 mg | Freq: Once | INTRAVENOUS | Status: DC | PRN

## 2024-01-08 MED ORDER — CHLORHEXIDINE GLUCONATE 0.12 % MT SOLN: 15.0000 mL | Freq: Once | OROMUCOSAL | Status: AC

## 2024-01-08 MED ORDER — CEFAZOLIN SODIUM-DEXTROSE 2-4 GM/100ML-% IV SOLN
2.0000 g | INTRAVENOUS | Status: AC
  Administered 2024-01-08: 2 g via INTRAVENOUS

## 2024-01-08 MED ORDER — ONDANSETRON HCL 4 MG/2ML IJ SOLN
INTRAMUSCULAR | Status: DC | PRN
  Administered 2024-01-08: 4 mg via INTRAVENOUS

## 2024-01-08 MED ORDER — METOCLOPRAMIDE HCL 5 MG/ML IJ SOLN: 5.0000 mg | Freq: Three times a day (TID) | INTRAMUSCULAR | Status: DC | PRN

## 2024-01-08 MED ORDER — MENTHOL 3 MG MT LOZG
1.0000 | LOZENGE | OROMUCOSAL | Status: DC | PRN
  Filled 2024-01-08: qty 9

## 2024-01-08 MED ORDER — FENTANYL CITRATE (PF) 250 MCG/5ML IJ SOLN
INTRAMUSCULAR | Status: AC
  Filled 2024-01-08: qty 5

## 2024-01-08 MED ORDER — METOCLOPRAMIDE HCL 5 MG PO TABS: 5.0000 mg | ORAL_TABLET | Freq: Three times a day (TID) | ORAL | Status: DC | PRN

## 2024-01-08 MED ORDER — CEFAZOLIN SODIUM-DEXTROSE 2-4 GM/100ML-% IV SOLN
2.0000 g | Freq: Four times a day (QID) | INTRAVENOUS | Status: AC
  Administered 2024-01-08 (×2): 2 g via INTRAVENOUS
  Filled 2024-01-08 (×2): qty 100

## 2024-01-08 MED ORDER — LIDOCAINE 2% (20 MG/ML) 5 ML SYRINGE
INTRAMUSCULAR | Status: DC | PRN
  Administered 2024-01-08: 60 mg via INTRAVENOUS

## 2024-01-08 MED ORDER — 0.9 % SODIUM CHLORIDE (POUR BTL) OPTIME
TOPICAL | Status: DC | PRN
  Administered 2024-01-08: 1000 mL

## 2024-01-08 MED ORDER — FENTANYL CITRATE (PF) 100 MCG/2ML IJ SOLN
25.0000 ug | INTRAMUSCULAR | Status: DC | PRN
  Administered 2024-01-08: 50 ug via INTRAVENOUS

## 2024-01-08 MED ORDER — LACTATED RINGERS IV SOLN: INTRAVENOUS | Status: DC

## 2024-01-08 MED ORDER — PHENYLEPHRINE HCL-NACL 20-0.9 MG/250ML-% IV SOLN
INTRAVENOUS | Status: DC | PRN
  Administered 2024-01-08: 25 ug/min via INTRAVENOUS

## 2024-01-08 MED ORDER — FENTANYL CITRATE (PF) 100 MCG/2ML IJ SOLN
INTRAMUSCULAR | Status: AC
  Filled 2024-01-08: qty 2

## 2024-01-08 MED ORDER — POLYETHYLENE GLYCOL 3350 17 G PO PACK
17.0000 g | PACK | Freq: Two times a day (BID) | ORAL | Status: DC
  Administered 2024-01-09 – 2024-01-12 (×4): 17 g via ORAL
  Filled 2024-01-08 (×11): qty 1

## 2024-01-08 SURGICAL SUPPLY — 42 items
BAG COUNTER SPONGE SURGICOUNT (BAG) ×1 IMPLANT
BIT DRILL CROWE POINT TWST 4.3 (DRILL) IMPLANT
BNDG COHESIVE 6X5 TAN ST LF (GAUZE/BANDAGES/DRESSINGS) IMPLANT
BRUSH SCRUB EZ 4% CHG (MISCELLANEOUS) ×1 IMPLANT
BRUSH SCRUB EZ PLAIN DRY (MISCELLANEOUS) ×2 IMPLANT
COVER PERINEAL POST (MISCELLANEOUS) ×1 IMPLANT
COVER SURGICAL LIGHT HANDLE (MISCELLANEOUS) ×1 IMPLANT
DRAPE C-ARMOR (DRAPES) ×1 IMPLANT
DRAPE SURG ORHT 6 SPLT 77X108 (DRAPES) ×2 IMPLANT
DRAPE U-SHAPE 47X51 STRL (DRAPES) ×1 IMPLANT
DRESSING MEPILEX FLEX 4X4 (GAUZE/BANDAGES/DRESSINGS) ×1 IMPLANT
DRSG MEPILEX POST OP 4X8 (GAUZE/BANDAGES/DRESSINGS) ×1 IMPLANT
ELECTRODE REM PT RTRN 9FT ADLT (ELECTROSURGICAL) ×1 IMPLANT
GLOVE BIO SURGEON STRL SZ8 (GLOVE) ×2 IMPLANT
GLOVE BIOGEL PI IND STRL 7.5 (GLOVE) ×1 IMPLANT
GLOVE BIOGEL PI IND STRL 8 (GLOVE) ×1 IMPLANT
GLOVE SURG ORTHO LTX SZ7.5 (GLOVE) ×2 IMPLANT
GOWN STRL REUS W/ TWL LRG LVL3 (GOWN DISPOSABLE) ×2 IMPLANT
GOWN STRL REUS W/ TWL XL LVL3 (GOWN DISPOSABLE) ×1 IMPLANT
GUIDEPIN VERSANAIL DSP 3.2X444 (ORTHOPEDIC DISPOSABLE SUPPLIES) IMPLANT
GUIDEWIRE BALL NOSE 100CM (WIRE) IMPLANT
HFN RH 130 DEG 9MM X 400MM (Nail) IMPLANT
KIT BASIN OR (CUSTOM PROCEDURE TRAY) ×1 IMPLANT
KIT TURNOVER KIT B (KITS) ×1 IMPLANT
MANIFOLD NEPTUNE II (INSTRUMENTS) ×1 IMPLANT
PACK GENERAL/GYN (CUSTOM PROCEDURE TRAY) ×1 IMPLANT
PAD ARMBOARD POSITIONER FOAM (MISCELLANEOUS) ×2 IMPLANT
SCREW BONE CORTICAL 5.0X42 (Screw) IMPLANT
SCREW BONE CORTICAL 5.0X44 (Screw) IMPLANT
SCREW LAG HIP FRA NAIL 10.5X90 (Orthopedic Implant) IMPLANT
SOLN 0.9% NACL 1000 ML (IV SOLUTION) ×1 IMPLANT
SOLN 0.9% NACL POUR BTL 1000ML (IV SOLUTION) ×1 IMPLANT
SOLN STERILE WATER 1000 ML (IV SOLUTION) ×1 IMPLANT
SOLN STERILE WATER BTL 1000 ML (IV SOLUTION) ×1 IMPLANT
STAPLER SKIN PROX 35W (STAPLE) ×1 IMPLANT
SUT ETHILON 2 0 FS 18 (SUTURE) ×1 IMPLANT
SUT ETHILON 2 0 PSLX (SUTURE) IMPLANT
SUT VIC AB 0 CT1 27XBRD ANBCTR (SUTURE) ×1 IMPLANT
SUT VIC AB 1 CT1 27XBRD ANBCTR (SUTURE) ×1 IMPLANT
SUT VIC AB 2-0 CT1 TAPERPNT 27 (SUTURE) ×1 IMPLANT
TOWEL GREEN STERILE (TOWEL DISPOSABLE) ×2 IMPLANT
TOWEL GREEN STERILE FF (TOWEL DISPOSABLE) ×1 IMPLANT

## 2024-01-08 NOTE — Op Note (Signed)
 NAME: Wanda Newton MEDICAL RECORD WN:[id number] DATE OF BIRTH:November 10, 1950 PHYSICIAN:Rielyn Krupinski H. Inge Waldroup, MD  OPERATIVE REPORT  DATE OF PROCEDURE:  01/08/2024  PREOPERATIVE DIAGNOSIS:  RIGHT PATHOLOGICAL FEMORAL SHAFT FRACTURE.  POSTOPERATIVE DIAGNOSIS:  RIGHT PATHOLOGICAL FEMORAL SHAFT FRACTURE.  PROCEDURES: CEPHALOMEDULLARY NAILING OF THE RIGHT FEMUR with Biomet Affixus 9 x 400  mm statically locked recon nail.  SURGEON:  Ozell Bruch, MD  ASSISTANT:  Francis Mt, PA-C  ANESTHESIA:  General.  COMPLICATIONS:  None.  TOURNIQUET:  None.  SPECIMENS:  None.    INS AND OUTS:  Please refer to the anesthetic record.  DISPOSITION:  To PACU.  CONDITION:  Stable.  CONTRAINDICATIONS TO DEEP VEIN THROMBOSIS PROPHYLAXIS:  None.  BRIEF SUMMARY AND INDICATION OF PROCEDURE:  Wanda Newton is a 73 y.o. year-old with recently diagnosed multiple myeloma problems.  I discussed with the patient risks and benefits of surgical treatment including the potential for malunion, nonunion, symptomatic hardware, heart attack, stroke, neurovascular injury, bleeding, and others.  After full discussion, the patient wished to proceed.  BRIEF SUMMARY OF PROCEDURE:  The patient was taken to the operating room where general anesthesia was induced.  She was positioned supine on the flat radiolucent table. A thorough scrub and wash with chlorhexidine  and then Betadine  scrub and paint was performed.  After sterile drapes and time-out, a long instrument was used to identify the appropriate starting position under C-arm on both AP and lateral images.  A 3 cm incision was made proximal to the greater trochanter.  The curved cannulated awl was inserted just medial to the tip of the lateral trochanter and then the starting guidewire advanced into the proximal femur.  This was checked on AP and lateral views.  The starting reamer was engaged while protecting the soft tissue with a sleeve.  The curved ball-tipped  guidewire was then inserted, making sure it safely crossed the fracture site and then stayed as posterior as possible in the distal femur. We encountered chatter at 9 mm, sequentially reamed up to 10.5, making sure the fracture remained reduced during reaming, and inserted an 9 x 400 mm nail to the appropriate depth.  The guidewire for the lag screw was then inserted with anteversion to make sure it was in a center-center Position within the femoral head.  This was measured and the lag screw placed with excellent purchase and position checked on both views.  The telescoping setscrew was fully tightened to create a fixed angle construct given her multiple myeloma.  I turned attention distally where two locking bolts were placed using perfect circle technique.  Final images were obtained showing excellent reduction, hardware placement, trajectory and length.  Francis Mt, PA-C assisted throughout the case with establishment of reduction, and maintenance of reduction throughout the procedure until I could place the distal locks.  He also assisted with wound closure. We irrigated and closed the wound in standard layered fashion using running 0 and figure-of-eight 0 Vicryl for the vastus lateralis, interrupted figure-of- eight #1 Vicryl for the tensor and then 2-0 Vicryl and 2-0 nylon for the skin.  Sterile gently compressive dressing was applied.  The patient was awakened from anesthesia and transported to the PACU in stable condition.   PROGNOSIS:  Wanda Newton has a more guarded prognosis because of the large cavitary defect which led to her pathologic fracture, increasing the rate of nonunion and hardware failure.  She is also at elevated risk of DVT and PE.  She will be on formal pharmacologic  DVT prophylaxis and we will go ahead and start PT, OT with weightbearing as tolerated.    Ozell DEL. Celena, M.D.

## 2024-01-08 NOTE — Evaluation (Signed)
 Clinical/Bedside Swallow Evaluation Patient Details  Name: INGRIS PASQUARELLA MRN: [id number] Date of Birth: [birth year]  Today's Date: 01/08/2024 Time: SLP Start Time (ACUTE ONLY): 1412 SLP Stop Time (ACUTE ONLY): 1420 SLP Time Calculation (min) (ACUTE ONLY): 8 min  Past Medical History:  Past Medical History:  Diagnosis Date   Allergy    Arthritis    Blood transfusion without reported diagnosis    Complication of anesthesia    Hard time waking up.   Diabetes mellitus without complication (HCC)    pre-diabetes- Patient states no pre-diabetes   Hypertension    Vertigo    Past Surgical History:  Past Surgical History:  Procedure Laterality Date   ABDOMINAL HYSTERECTOMY     FOOT SURGERY     x3   TONSILLECTOMY     TOTAL KNEE ARTHROPLASTY Right 03/18/2016   Procedure: TOTAL KNEE ARTHROPLASTY;  Surgeon: Donnice Car, MD;  Location: [facility];  Service: Orthopedics;  Laterality: Right;   TOTAL KNEE ARTHROPLASTY Left 11/18/2022   Procedure: LEFT TOTAL KNEE ARTHROPLASTY;  Surgeon: Car Donnice, MD;  Location: [facility];  Service: Orthopedics;  Laterality: Left;   HPI:  Pt is a 73 year old female who underwent cephalomedullary nailing of right femur due to right pathological fx. Pt has a recent diagnosis of multiple myeloma.    Assessment / Plan / Recommendation  Clinical Impression  Pt demonstrates no sign of oropharyngeal dysphagia. Pt denies history and feels she is doing well overall. SLP reviewed precautions. Continue diet, no f/u.      Aspiration Risk       Diet Recommendation Regular;Thin liquid    Liquid Administration via: Cup;Straw Medication Administration: Whole meds with liquid Supervision: Patient able to self feed    Other  Recommendations       Assistance Recommended at Discharge    Functional Status Assessment    Frequency and Duration            Prognosis        Swallow Study   General HPI: Pt is a 73 year old female who underwent cephalomedullary  nailing of right femur due to right pathological fx. Pt has a recent diagnosis of multiple myeloma. Type of Study: Bedside Swallow Evaluation Previous Swallow Assessment: none Diet Prior to this Study: Regular;Thin liquids (Level 0) Temperature Spikes Noted: No Respiratory Status: Room air History of Recent Intubation: No Behavior/Cognition: Alert;Cooperative Oral Cavity Assessment: Within Functional Limits Oral Care Completed by SLP: No Oral Cavity - Dentition: Adequate natural dentition Patient Positioning: Upright in bed Baseline Vocal Quality: Normal Volitional Cough: Strong Volitional Swallow: Able to elicit    Oral/Motor/Sensory Function Overall Oral Motor/Sensory Function: Within functional limits   Ice Chips     Thin Liquid Thin Liquid: Within functional limits    Nectar Thick Nectar Thick Liquid: Not tested   Honey Thick Honey Thick Liquid: Not tested   Puree Puree: Not tested   Solid     Solid: Within functional limits      Konnor Jorden, Consuelo Fitch 01/08/2024,2:39 PM

## 2024-01-08 NOTE — Progress Notes (Signed)
 Initial Nutrition Assessment  DOCUMENTATION CODES:   Obesity unspecified  INTERVENTION:  Regular diet  Magic cup TID with meals, each supplement provides 290 kcal and 9 grams of protein Encourage PO intake  Check 25-Hydroxyvitamin D (25-OHD) due to patient with h/o of deficiency + recent femur fracture    NUTRITION DIAGNOSIS:   Inadequate oral intake related to decreased appetite as evidenced by per patient/family report.   GOAL:   Patient will meet greater than or equal to 90% of their needs   MONITOR:   PO intake, Supplement acceptance  REASON FOR ASSESSMENT:   Consult Assessment of nutrition requirement/status (Hip/Femur fracture patient)  ASSESSMENT:   Past medical history significant of DM, multiple myeloma, and HTN who presented on 10/16 with hip pain after moving from bed to wheelchair.  Patient seen in room after surgery this morning, friend at bedside. Pt reports decreased appetite over the past few weeks, has mainly been eating only once a day in the morning to take her medication and then will have snacks later in the day. Pt did not have breakfast due to being NPO for surgery. Pt not c/o of any pain during visit, informed her that PRN pain medication had been ordered and she will need to let RN know when she needs them depending on her pain levels.  Pt unsure of her usually body weight but thinks she may have lost a some weight over the past few weeks from poor po intake. Weight stable in chart over the past month. Pt does boost shaked occasionally and does not like the taste of ensure, she was agreeable to try magic cup to help supplement PO intake to help with post op healing.  Meds: colace  Labs: Glu 129  NUTRITION - FOCUSED PHYSICAL EXAM:  Flowsheet Row Most Recent Value  Orbital Region No depletion  Upper Arm Region No depletion  Thoracic and Lumbar Region No depletion  Buccal Region No depletion  Temple Region No depletion  Clavicle Bone Region No  depletion  Clavicle and Acromion Bone Region No depletion  Scapular Bone Region No depletion  Dorsal Hand No depletion  Patellar Region No depletion  Anterior Thigh Region No depletion  Posterior Calf Region No depletion  Hair Reviewed  Eyes Reviewed  Mouth Reviewed  Skin Reviewed  Nails Reviewed    Diet Order:   Diet Order             Diet regular Room service appropriate? Yes; Fluid consistency: Thin  Diet effective now                   EDUCATION NEEDS:   Education needs have been addressed  Skin:  Skin Assessment: Skin Integrity Issues: Skin Integrity Issues:: Incisions Incisions: right leg  Last BM:  10/15  Height:   Ht Readings from Last 1 Encounters:  01/07/24 5' 5 (1.651 m)    Weight:   Wt Readings from Last 1 Encounters:  01/08/24 100.8 kg    Ideal Body Weight:  56.8 kg  BMI:  Body mass index is 36.98 kg/m.  Estimated Nutritional Needs:   Kcal:  1700-1900 kcals  Protein:  68-85 grams  Fluid:  1.7-1.9L/d  Madalyn Potters, MS, RD, LDN Clinical Dietitian  Contact via secure chat. If unavailable, use group chat RD Inpatient.

## 2024-01-08 NOTE — Anesthesia Procedure Notes (Signed)
 Procedure Name: Intubation Date/Time: 01/08/2024 8:40 AM  Performed by: Zelphia Norleen HERO, CRNAPre-anesthesia Checklist: Patient identified, Emergency Drugs available, Suction available and Patient being monitored Patient Re-evaluated:Patient Re-evaluated prior to induction Oxygen Delivery Method: Circle system utilized Preoxygenation: Pre-oxygenation with 100% oxygen Induction Type: IV induction Ventilation: Mask ventilation without difficulty Laryngoscope Size: Mac and 3 Grade View: Grade I Tube type: Oral Tube size: 7.0 mm Number of attempts: 1 Airway Equipment and Method: Stylet and Oral airway Placement Confirmation: ETT inserted through vocal cords under direct vision, positive ETCO2 and breath sounds checked- equal and bilateral Secured at: 22 cm Tube secured with: Tape Dental Injury: Teeth and Oropharynx as per pre-operative assessment

## 2024-01-08 NOTE — Anesthesia Preprocedure Evaluation (Signed)
 Anesthesia Evaluation  Patient identified by MRN, date of birth, ID band Patient awake    Reviewed: Allergy & Precautions, NPO status , Patient's Chart, lab work & pertinent test results  Airway Mallampati: II  TM Distance: >3 FB Neck ROM: Full    Dental  (+) Dental Advisory Given   Pulmonary former smoker   breath sounds clear to auscultation       Cardiovascular hypertension, Pt. on medications  Rhythm:Regular Rate:Normal     Neuro/Psych negative neurological ROS     GI/Hepatic negative GI ROS, Neg liver ROS,,,  Endo/Other  diabetes, Type 2    Renal/GU negative Renal ROS     Musculoskeletal  (+) Arthritis ,    Abdominal   Peds  Hematology  (+) Blood dyscrasia, anemia   Anesthesia Other Findings   Reproductive/Obstetrics                              Anesthesia Physical Anesthesia Plan  ASA: 3  Anesthesia Plan: General   Post-op Pain Management: Ofirmev  IV (intra-op)*   Induction: Intravenous  PONV Risk Score and Plan: 3 and Dexamethasone , Ondansetron  and Treatment may vary due to age or medical condition  Airway Management Planned: Oral ETT  Additional Equipment:   Intra-op Plan:   Post-operative Plan: Extubation in OR  Informed Consent: I have reviewed the patients History and Physical, chart, labs and discussed the procedure including the risks, benefits and alternatives for the proposed anesthesia with the patient or authorized representative who has indicated his/her understanding and acceptance.     Dental advisory given  Plan Discussed with: CRNA  Anesthesia Plan Comments:         Anesthesia Quick Evaluation

## 2024-01-08 NOTE — Plan of Care (Signed)
   Problem: Education: Goal: Knowledge of General Education information will improve Description Including pain rating scale, medication(s)/side effects and non-pharmacologic comfort measures Outcome: Progressing   Problem: Health Behavior/Discharge Planning: Goal: Ability to manage health-related needs will improve Outcome: Progressing

## 2024-01-08 NOTE — Transfer of Care (Signed)
 Immediate Anesthesia Transfer of Care Note  Patient: Wanda Newton  Procedure(s) Performed: INSERTION, INTRAMEDULLARY ROD, FEMUR (Right)  Patient Location: PACU  Anesthesia Type:General  Level of Consciousness: awake, alert , and oriented  Airway & Oxygen Therapy: Patient Spontanous Breathing  Post-op Assessment: Report given to RN and Post -op Vital signs reviewed and stable  Post vital signs: Reviewed and stable  Last Vitals:  Vitals Value Taken Time  BP 146/83 01/08/24 10:21  Temp 36.8 C 01/08/24 10:21  Pulse 92 01/08/24 10:24  Resp 19 01/08/24 10:24  SpO2 93 % 01/08/24 10:24  Vitals shown include unfiled device data.  Last Pain:  Vitals:   01/08/24 1021  TempSrc:   PainSc: Asleep         Complications: No notable events documented.

## 2024-01-08 NOTE — Consult Note (Signed)
 Orthopaedic Trauma Service (OTS) Consultation   Patient ID: AUNISTY REALI MRN: [id number] DOB/AGE: 03-30-1950 73 y.o.   Reason for Consult:  right femur fracture Referring Physician: Delon Herald, MD  HPI: Wanda Newton is an 73 y.o. female with impending pathologic fracture that progressed to full fracture. Pain is  moderately well controlled now, aching and dull, sharp and severe with motion, without associated distal tingling or numbness, and improved with narcotics. Vertigo if leans to the right.   Past Medical History:  Diagnosis Date   Allergy    Arthritis    Blood transfusion without reported diagnosis    Complication of anesthesia    Hard time waking up.   Diabetes mellitus without complication (HCC)    pre-diabetes- Patient states no pre-diabetes   Hypertension    Vertigo     Past Surgical History:  Procedure Laterality Date   ABDOMINAL HYSTERECTOMY     FOOT SURGERY     x3   TONSILLECTOMY     TOTAL KNEE ARTHROPLASTY Right 03/18/2016   Procedure: TOTAL KNEE ARTHROPLASTY;  Surgeon: Donnice Car, MD;  Location: [facility];  Service: Orthopedics;  Laterality: Right;   TOTAL KNEE ARTHROPLASTY Left 11/18/2022   Procedure: LEFT TOTAL KNEE ARTHROPLASTY;  Surgeon: Car Donnice, MD;  Location: [facility];  Service: Orthopedics;  Laterality: Left;    Family History  Problem Relation Age of Onset   Diabetes Mother    Hypertension Mother    Diabetes Brother    Hypertension Brother    Diabetes Sister    Mental illness Sister 51       schizophrenia   Gout Brother     Social History:  reports that she quit smoking about 22 years ago. Her smoking use included cigarettes. She has never used smokeless tobacco. She reports that she does not drink alcohol and does not use drugs.  Allergies:  Allergies  Allergen Reactions   Red Dye #40 (Allura Red) Shortness Of Breath    Patient states this reaction ONLY occurred with BENADRYL  TABLET   Amlodipine  Swelling     Bilateral LE swelling with medication.     Medications: I have reviewed the patient's current medications.  Results for orders placed or performed during the hospital encounter of 01/07/24 (from the past 48 hours)  CBC with Differential     Status: Abnormal   Collection Time: 01/07/24  7:40 AM  Result Value Ref Range   WBC 8.8 4.0 - 10.5 K/uL   RBC 3.63 (L) 3.87 - 5.11 MIL/uL   Hemoglobin 10.6 (L) 12.0 - 15.0 g/dL   HCT 66.9 (L) 63.9 - 53.9 %   MCV 90.9 80.0 - 100.0 fL   MCH 29.2 26.0 - 34.0 pg   MCHC 32.1 30.0 - 36.0 g/dL   RDW 85.3 88.4 - 84.4 %   Platelets 242 150 - 400 K/uL   nRBC 0.0 0.0 - 0.2 %   Neutrophils Relative % 84 %   Neutro Abs 7.3 1.7 - 7.7 K/uL   Lymphocytes Relative 11 %   Lymphs Abs 0.9 0.7 - 4.0 K/uL   Monocytes Relative 5 %   Monocytes Absolute 0.4 0.1 - 1.0 K/uL   Eosinophils Relative 0 %   Eosinophils Absolute 0.0 0.0 - 0.5 K/uL   Basophils Relative 0 %   Basophils Absolute 0.0 0.0 - 0.1 K/uL   Immature Granulocytes 0 %   Abs Immature Granulocytes 0.03 0.00 - 0.07 K/uL  Comment: Performed at [facility], [address] [place], [place] [address]  Basic metabolic panel     Status: Abnormal   Collection Time: 01/07/24  7:40 AM  Result Value Ref Range   Sodium 139 135 - 145 mmol/L   Potassium 2.8 (L) 3.5 - 5.1 mmol/L   Chloride 103 98 - 111 mmol/L   CO2 21 (L) 22 - 32 mmol/L   Glucose, Bld 98 70 - 99 mg/dL    Comment: Glucose reference range applies only to samples taken after fasting for at least 8 hours.   BUN 20 8 - 23 mg/dL   Creatinine, Ser 9.35 0.44 - 1.00 mg/dL   Calcium  7.9 (L) 8.9 - 10.3 mg/dL   GFR, Estimated >39 >39 mL/min    Comment: (NOTE) Calculated using the CKD-EPI Creatinine Equation (2021)    Anion gap 15 5 - 15    Comment: Performed at [facility], [address] [place], [place] [address]  Magnesium      Status: None   Collection Time: 01/07/24  7:40 AM  Result Value Ref Range    Magnesium  2.0 1.7 - 2.4 mg/dL    Comment: Performed at [facility], [address], [place], [place] [address]  Basic metabolic panel with GFR     Status: Abnormal   Collection Time: 01/07/24  6:48 PM  Result Value Ref Range   Sodium 136 135 - 145 mmol/L   Potassium 3.2 (L) 3.5 - 5.1 mmol/L   Chloride 103 98 - 111 mmol/L   CO2 22 22 - 32 mmol/L   Glucose, Bld 123 (H) 70 - 99 mg/dL    Comment: Glucose reference range applies only to samples taken after fasting for at least 8 hours.   BUN 15 8 - 23 mg/dL   Creatinine, Ser 9.28 0.44 - 1.00 mg/dL   Calcium  7.7 (L) 8.9 - 10.3 mg/dL   GFR, Estimated >39 >39 mL/min    Comment: (NOTE) Calculated using the CKD-EPI Creatinine Equation (2021)    Anion gap 11 5 - 15    Comment: Performed at [facility], [address], [place], [place] [address]  MRSA Next Gen by PCR, Nasal     Status: None   Collection Time: 01/08/24 12:11 AM   Specimen: Nasal Mucosa; Nasal Swab  Result Value Ref Range   MRSA by PCR Next Gen NOT DETECTED NOT DETECTED    Comment: (NOTE) The GeneXpert MRSA Assay (FDA approved for NASAL specimens only), is one component of a comprehensive MRSA colonization surveillance program. It is not intended to diagnose MRSA infection nor to guide or monitor treatment for MRSA infections. Test performance is not FDA approved in patients less than 2 years old. Performed at [facility], [address], [place], [place] [address]   CBC with Differential/Platelet     Status: Abnormal   Collection Time: 01/08/24  3:45 AM  Result Value Ref Range   WBC 6.8 4.0 - 10.5 K/uL   RBC 3.54 (L) 3.87 - 5.11 MIL/uL   Hemoglobin 10.5 (L) 12.0 - 15.0 g/dL   HCT 67.4 (L) 63.9 - 53.9 %   MCV 91.8 80.0 - 100.0 fL   MCH 29.7 26.0 - 34.0 pg   MCHC 32.3 30.0 - 36.0 g/dL   RDW 85.0 88.4 - 84.4 %   Platelets 239 150 - 400 K/uL   nRBC 0.0 0.0 - 0.2 %   Neutrophils Relative % 79 %  Neutro Abs 5.4 1.7 - 7.7 K/uL    Lymphocytes Relative 11 %   Lymphs Abs 0.8 0.7 - 4.0 K/uL   Monocytes Relative 8 %   Monocytes Absolute 0.5 0.1 - 1.0 K/uL   Eosinophils Relative 2 %   Eosinophils Absolute 0.2 0.0 - 0.5 K/uL   Basophils Relative 0 %   Basophils Absolute 0.0 0.0 - 0.1 K/uL   Immature Granulocytes 0 %   Abs Immature Granulocytes 0.02 0.00 - 0.07 K/uL    Comment: Performed at [facility], [address] [place], [place] [address]  Comprehensive metabolic panel with GFR     Status: Abnormal   Collection Time: 01/08/24  3:45 AM  Result Value Ref Range   Sodium 139 135 - 145 mmol/L   Potassium 4.2 3.5 - 5.1 mmol/L    Comment: HEMOLYSIS AT THIS LEVEL MAY AFFECT RESULT   Chloride 104 98 - 111 mmol/L   CO2 23 22 - 32 mmol/L   Glucose, Bld 129 (H) 70 - 99 mg/dL    Comment: Glucose reference range applies only to samples taken after fasting for at least 8 hours.   BUN 15 8 - 23 mg/dL   Creatinine, Ser 9.25 0.44 - 1.00 mg/dL   Calcium  7.8 (L) 8.9 - 10.3 mg/dL   Total Protein 7.5 6.5 - 8.1 g/dL   Albumin 2.7 (L) 3.5 - 5.0 g/dL   AST 34 15 - 41 U/L    Comment: HEMOLYSIS AT THIS LEVEL MAY AFFECT RESULT   ALT 21 0 - 44 U/L    Comment: HEMOLYSIS AT THIS LEVEL MAY AFFECT RESULT   Alkaline Phosphatase 53 38 - 126 U/L   Total Bilirubin 0.7 0.0 - 1.2 mg/dL    Comment: HEMOLYSIS AT THIS LEVEL MAY AFFECT RESULT   GFR, Estimated >60 >60 mL/min    Comment: (NOTE) Calculated using the CKD-EPI Creatinine Equation (2021)    Anion gap 12 5 - 15    Comment: Performed at [facility], [address], [place], [place] [address]  Magnesium      Status: None   Collection Time: 01/08/24  3:45 AM  Result Value Ref Range   Magnesium  2.4 1.7 - 2.4 mg/dL    Comment: Performed at [facility], [address] [place], [place] [address]    DG Femur Portable Min 2 Views Right Result Date: 01/07/2024 EXAM: 2 VIEW(S) XRAY OF THE RIGHT FEMUR 01/07/2024 09:05:00 AM COMPARISON: Right femur series 12/02/2023.  CLINICAL HISTORY: R leg pain and deformity with known multiple myeloma. Increasing right hip pain, felt a pop in right hip with deformity. FINDINGS: BONES AND JOINTS: Comminuted pathologic fracture of the proximal third right femur shaft, corresponding to the area of 7 to 8 cm lytic lesion of the proximal shaft last month. 30 degree varus angulation of the distal fragment and 1 full shaft width medial displacement. 1 half shaft width posterior displacement. Right total knee arthroplasty is visible, appears to remain intact and aligned. Other heterogeneous bone mineralization about the right hip is stable. No joint dislocation. SOFT TISSUES: The soft tissues are unremarkable. IMPRESSION: 1. Comminuted and angulated pathologic fracture of the proximal right femoral shaft site of lytic bone lesion. Electronically signed by: Helayne Hurst MD 01/07/2024 09:19 AM EDT RP Workstation: [id number]    Intake/Output      10/16 0701 10/17 0700 10/17 0701 10/18 0700   P.O. 320    IV Piggyback 405    Total Intake(mL/kg)  725 (8)    Urine (mL/kg/hr) 150 (0.1)    Total Output 150    Net +575            ROS No recent fever, bleeding abnormalities, urologic dysfunction, GI problems, or weight gain.  Blood pressure 139/70, pulse 83, temperature 99.2 F (37.3 C), temperature source Oral, resp. rate 16, height 5' 5 (1.651 m), weight 90.7 kg, SpO2 96%. Physical Exam NCAT RRR No wheezing RLE Tender right hip and femur  Edema/ swelling controlled  Sens: DPN, SPN, TN intact  Motor: EHL, FHL, and lessor toe ext and flex all intact grossly  DP 2+, PT 2+, Brisk cap refill, warm to touch     Gait: could not assess Coordination and balance: could not assess   Assessment/Plan:  Right femur path fracture from multiple myeloma No h/s DVT  The risks and benefits of right femur repair were discussed with the patient, including the possibility of infection, nerve injury, vessel injury, wound breakdown, arthritis,  symptomatic hardware, DVT/ PE, loss of motion, malunion, nonunion, and need for further surgery among others.  We also specifically discussed the possible need to stage surgery because of the elevated risk of soft tissue breakdown that could lead to amputation.  These risks were acknowledged and consent was provided to proceed.   Ozell Bruch, MD [facility], [facility] [phone]  01/08/2024, 8:01 AM  [facility] [address] [place] [address] [phone] (F)    After 5pm and on the weekends please log on to Amion, go to orthopaedics and the look under the [facility] Call for the provider(s) on call. You can also call our office at [phone] and then follow the prompts to be connected to the call team.

## 2024-01-08 NOTE — Progress Notes (Signed)
 PROGRESS NOTE    [name]  FMW:[id number] DOB: Mar 25, 1950 DOA: 01/07/2024 PCP: Allen Lauraine LITTIE, PA-C  Chief Complaint  Patient presents with   Hip Pain    Brief Narrative:   Wanda Newton is Wanda Newton 73 y.o. female with medical history significant of DM, multiple myeloma, and HTN who presented on 10/16 with hip pain after moving from bed to wheelchair.   Assessment & Plan:   Principal Problem:   Pathological fracture in neoplastic disease, right femur, initial encounter for fracture [facility]) Active Problems:   Benign essential HTN   Obesity, Class II, BMI 35-39.9   Multiple myeloma (HCC)   Anemia of chronic disease   Femur fracture (HCC)  R pathologic femur shaft fracture Known lytic lesion of the R femur Comminuted pathologic fracture of the R femur shaft S/p cephalomedulary nailing of R femur 10/17 Guarded prognosis due to the large cavitary defect leading to pathologic fx (increasing rate of nonunion and hardware failure) - also increased risk of VTE PT/OT, WBAT Pain, bowel regimen    Multiple myeloma Initial treatment with daratumumab, Revlimid, Velcase, and dexamethasone  on 10/13 She is due for weekly immunotherapy treatments but this may be delayed given current situation Continue daily Revlimid Also on Zoledronic acid q4 weeks Followed by Dr. Laurence from [place]   Anemia associated with multiple myeloma Hgb 11.3 on 10/13 now 10.6 Will monitor Hold ASA for now Transfuse for Hgb <7   Hypokalemia Improved Hydrochlorothiazide  dose reduced below    HTN Continue HTCZ - will decrease dose to 25 mg daily (50 mg dose increases side effects without significant positive effects)   Obesity Body mass index is 36.98 kg/m.    DVT prophylaxis: lovenox Code Status: full Family Communication: none Disposition:   Status is: Inpatient Remains inpatient appropriate because: need for continued inpatient care   Consultants:  ortho  Procedures:   PROCEDURES: CEPHALOMEDULLARY NAILING OF THE RIGHT FEMUR with Biomet Affixus 9 x 400  mm statically locked recon nail.  Antimicrobials:  Anti-infectives (From admission, onward)    Start     Dose/Rate Route Frequency Ordered Stop   01/08/24 1430  ceFAZolin  (ANCEF ) IVPB 2g/100 mL premix        2 g 200 mL/hr over 30 Minutes Intravenous Every 6 hours 01/08/24 1201 01/09/24 0229   01/08/24 0745  ceFAZolin  (ANCEF ) IVPB 2g/100 mL premix        2 g 200 mL/hr over 30 Minutes Intravenous On call to O.R. 01/08/24 0647 01/08/24 0859   01/07/24 2200  valACYclovir (VALTREX) tablet 500 mg        500 mg Oral 2 times daily 01/07/24 1417         Subjective: Comfortable post op, sister at bedside  Objective: Vitals:   01/08/24 1115 01/08/24 1125 01/08/24 1140 01/08/24 1407  BP: (!) 131/59 (!) 138/54 (P) 133/60   Pulse: 67 62 (P) 60   Resp: (!) 24 (!) 21 (P) 20   Temp: 98.2 F (36.8 C) 98.2 F (36.8 C) (P) 98.4 F (36.9 C)   TempSrc:   (P) Oral   SpO2: 96% 95% (P) 96%   Weight:    100.8 kg  Height:        Intake/Output Summary (Last 24 hours) at 01/08/2024 1617 Last data filed at 01/08/2024 1016 Gross per 24 hour  Intake 1221.67 ml  Output 200 ml  Net 1021.67 ml   Filed Weights   01/07/24 0619 01/08/24 1407  Weight: 90.7 kg 100.8 kg  Examination:  General exam: Appears calm and comfortable  Respiratory system: unlabored Cardiovascular system: RRR Central nervous system: Alert and oriented. No focal neurological deficits. Extremities: RLE with intact dressing    Data Reviewed: I have personally reviewed following labs and imaging studies  CBC: Recent Labs  Lab 01/07/24 0740 01/08/24 0345  WBC 8.8 6.8  NEUTROABS 7.3 5.4  HGB 10.6* 10.5*  HCT 33.0* 32.5*  MCV 90.9 91.8  PLT 242 239    Basic Metabolic Panel: Recent Labs  Lab 01/07/24 0740 01/07/24 1848 01/08/24 0345  NA 139 136 139  K 2.8* 3.2* 4.2  CL 103 103 104  CO2 21* 22 23  GLUCOSE 98 123* 129*   BUN 20 15 15   CREATININE 0.64 0.71 0.74  CALCIUM  7.9* 7.7* 7.8*  MG 2.0  --  2.4    GFR: Estimated Creatinine Clearance: 73.7 mL/min (by C-G formula based on SCr of 0.74 mg/dL).  Liver Function Tests: Recent Labs  Lab 01/08/24 0345  AST 34  ALT 21  ALKPHOS 53  BILITOT 0.7  PROT 7.5  ALBUMIN 2.7*    CBG: Recent Labs  Lab 01/08/24 1023  GLUCAP 161*     Recent Results (from the past 240 hours)  MRSA Next Gen by PCR, Nasal     Status: None   Collection Time: 01/08/24 12:11 AM   Specimen: Nasal Mucosa; Nasal Swab  Result Value Ref Range Status   MRSA by PCR Next Gen NOT DETECTED NOT DETECTED Final    Comment: (NOTE) The GeneXpert MRSA Assay (FDA approved for NASAL specimens only), is one component of Gazella Anglin comprehensive MRSA colonization surveillance program. It is not intended to diagnose MRSA infection nor to guide or monitor treatment for MRSA infections. Test performance is not FDA approved in patients less than 56 years old. Performed at [facility], [address] [place], [place] [address]          Radiology Studies: DG FEMUR PORT, MIN 2 VIEWS RIGHT Result Date: 01/08/2024 CLINICAL DATA:  Fracture, postop. EXAM: RIGHT FEMUR PORTABLE 2 VIEW COMPARISON:  Preoperative imaging. FINDINGS: Femoral intramedullary nail with trans trochanteric and distal locking screw fixation traverse pathologic femoral shaft fracture. Decreased angulation from preoperative imaging. Recent postsurgical change includes air and edema in the soft tissues. IMPRESSION: ORIF pathologic femoral shaft fracture. Electronically Signed   By: Andrea Gasman M.D.   On: 01/08/2024 15:38   DG FEMUR, MIN 2 VIEWS RIGHT Result Date: 01/08/2024 CLINICAL DATA:  Elective surgery. EXAM: RIGHT FEMUR 2 VIEWS COMPARISON:  Femur radiograph 01/07/2024 FINDINGS: Six fluoroscopic spot views of the right femur submitted from the operating room. Femoral intramedullary nail with trans trochanteric and  distal locking screw fixation traverse pathologic femoral shaft fracture. Fluoroscopy time 100.7 seconds. Dose 24.66 mGy. IMPRESSION: Intraoperative fluoroscopy during right femoral intramedullary nail fixation. Electronically Signed   By: Andrea Gasman M.D.   On: 01/08/2024 15:37   DG C-Arm 1-60 Min-No Report Result Date: 01/08/2024 Fluoroscopy was utilized by the requesting physician.  No radiographic interpretation.   DG C-Arm 1-60 Min-No Report Result Date: 01/08/2024 Fluoroscopy was utilized by the requesting physician.  No radiographic interpretation.   DG Femur Portable Min 2 Views Right Result Date: 01/07/2024 EXAM: 2 VIEW(S) XRAY OF THE RIGHT FEMUR 01/07/2024 09:05:00 AM COMPARISON: Right femur series 12/02/2023. CLINICAL HISTORY: R leg pain and deformity with known multiple myeloma. Increasing right hip pain, felt [place] [employer] pop in right hip with deformity. FINDINGS: BONES AND JOINTS: Comminuted pathologic  fracture of the proximal third right femur shaft, corresponding to the area of 7 to 8 cm lytic lesion of the proximal shaft last month. 30 degree varus angulation of the distal fragment and 1 full shaft width medial displacement. 1 half shaft width posterior displacement. Right total knee arthroplasty is visible, appears to remain intact and aligned. Other heterogeneous bone mineralization about the right hip is stable. No joint dislocation. SOFT TISSUES: The soft tissues are unremarkable. IMPRESSION: 1. Comminuted and angulated pathologic fracture of the proximal right femoral shaft site of lytic bone lesion. Electronically signed by: Helayne Hurst MD 01/07/2024 09:19 AM EDT RP Workstation: [id number]        Scheduled Meds:  docusate sodium   100 mg Oral BID   [START ON 01/09/2024] enoxaparin (LOVENOX) injection  40 mg Subcutaneous Q24H   lenalidomide  20 mg Oral Daily   montelukast  10 mg Oral QHS   valACYclovir  500 mg Oral BID   Continuous Infusions:   ceFAZolin  (ANCEF ) IV 2 g  (01/08/24 1425)     LOS: 1 day    Time spent: over 30 min     Meliton Monte, MD [facility]   To contact the attending provider between 7A-7P or the covering provider during after hours 7P-7A, please log into the web site [email or web address] and access using universal [place] password for that web site. If you do not have the password, please call the hospital operator.  01/08/2024, 4:17 PM

## 2024-01-09 DIAGNOSIS — M84551A Pathological fracture in neoplastic disease, right femur, initial encounter for fracture: Secondary | ICD-10-CM | POA: Diagnosis not present

## 2024-01-09 LAB — BASIC METABOLIC PANEL WITH GFR
Anion gap: 13 (ref 5–15)
BUN: 13 mg/dL (ref 8–23)
CO2: 22 mmol/L (ref 22–32)
Calcium: 7.6 mg/dL — ABNORMAL LOW (ref 8.9–10.3)
Chloride: 104 mmol/L (ref 98–111)
Creatinine, Ser: 0.7 mg/dL (ref 0.44–1.00)
GFR, Estimated: >60 mL/min (ref >60)
Glucose, Bld: 133 mg/dL — ABNORMAL HIGH (ref 70–99)
Potassium: 3.6 mmol/L (ref 3.5–5.1)
Sodium: 139 mmol/L (ref 135–145)

## 2024-01-09 LAB — CBC
HCT: 27.6 % — ABNORMAL LOW (ref 36.0–46.0)
Hemoglobin: 8.9 g/dL — ABNORMAL LOW (ref 12.0–15.0)
MCH: 30 pg (ref 26.0–34.0)
MCHC: 32.2 g/dL (ref 30.0–36.0)
MCV: 92.9 fL (ref 80.0–100.0)
Platelets: 212 K/uL (ref 150–400)
RBC: 2.97 MIL/uL — ABNORMAL LOW (ref 3.87–5.11)
RDW: 14.9 % (ref 11.5–15.5)
WBC: 6.8 K/uL (ref 4.0–10.5)
nRBC: 0 % (ref 0.0–0.2)

## 2024-01-09 LAB — VITAMIN D 25 HYDROXY (VIT D DEFICIENCY, FRACTURES): Vit D, 25-Hydroxy: 38.43 ng/mL (ref 30–100)

## 2024-01-09 MED ORDER — ACETAMINOPHEN 325 MG PO TABS
650.0000 mg | ORAL_TABLET | Freq: Three times a day (TID) | ORAL | Status: DC
  Administered 2024-01-09 – 2024-01-14 (×12): 650 mg via ORAL
  Filled 2024-01-09 (×14): qty 2

## 2024-01-09 NOTE — Progress Notes (Signed)
 PROGRESS NOTE    Wanda Newton  FMW:[id number] DOB: [birth year] DOA: 01/07/2024 PCP: Allen Lauraine LITTIE, PA-C  Chief Complaint  Patient presents with   Hip Pain    Brief Narrative:   Wanda Newton is Wanda Newton 73 y.o. female with medical history significant of DM, multiple myeloma, and HTN who presented on 10/16 with hip pain after moving from bed to wheelchair.   Assessment & Plan:   Principal Problem:   Pathological fracture in neoplastic disease, right femur, initial encounter for fracture [facility]) Active Problems:   Benign essential HTN   Obesity, Class II, BMI 35-39.9   Multiple myeloma (HCC)   Anemia of chronic disease   Femur fracture (HCC)  R pathologic femur shaft fracture Known lytic lesion of the R femur Comminuted pathologic fracture of the R femur shaft S/p cephalomedulary nailing of R femur 10/17 Guarded prognosis due to the large cavitary defect leading to pathologic fx (increasing rate of nonunion and hardware failure) - also increased risk of VTE PT/OT, WBAT Pain, bowel regimen    Multiple myeloma Initial treatment with daratumumab, Revlimid, Velcase, and dexamethasone  on 10/13 She is due for weekly immunotherapy treatments but this may be delayed given current situation Continue daily Revlimid Also on Zoledronic acid q4 weeks Followed by Dr. Laurence from [place]   Anemia associated with multiple myeloma Hgb 11.3 on 10/13 now 10.6 Will monitor Hold ASA for now Transfuse for Hgb <7   Hypokalemia Improved Hydrochlorothiazide  dose reduced below    HTN Continue HTCZ - will decrease dose to 25 mg daily (50 mg dose increases side effects without significant positive effects)   Obesity Body mass index is 36.98 kg/m.    DVT prophylaxis: lovenox Code Status: full Family Communication: none Disposition:   Status is: Inpatient Remains inpatient appropriate because: need for continued inpatient care   Consultants:  ortho  Procedures:   PROCEDURES: CEPHALOMEDULLARY NAILING OF THE RIGHT FEMUR with Biomet Affixus 9 x 400  mm statically locked recon nail.  Antimicrobials:  Anti-infectives (From admission, onward)    Start     Dose/Rate Route Frequency Ordered Stop   01/08/24 1430  ceFAZolin  (ANCEF ) IVPB 2g/100 mL premix        2 g 200 mL/hr over 30 Minutes Intravenous Every 6 hours 01/08/24 1201 01/08/24 2112   01/08/24 0745  ceFAZolin  (ANCEF ) IVPB 2g/100 mL premix        2 g 200 mL/hr over 30 Minutes Intravenous On call to O.R. 01/08/24 0647 01/08/24 0859   01/07/24 2200  valACYclovir (VALTREX) tablet 500 mg        500 mg Oral 2 times daily 01/07/24 1417         Subjective: No new complaints today Sistser at bedside  Objective: Vitals:   01/09/24 0423 01/09/24 0824 01/09/24 1432 01/09/24 1720  BP: 121/72 (!) 130/56 (!) 122/49 (!) 144/73  Pulse: 88 67 73 96  Resp: 14 19 19 20   Temp: 98.8 F (37.1 C) 98.5 F (36.9 C) 98.8 F (37.1 C) 99.5 F (37.5 C)  TempSrc:  Oral    SpO2: 99% 96% 97% 98%  Weight:      Height:        Intake/Output Summary (Last 24 hours) at 01/09/2024 1732 Last data filed at 01/08/2024 2335 Gross per 24 hour  Intake 360 ml  Output 250 ml  Net 110 ml   Filed Weights   01/07/24 0619 01/08/24 1407  Weight: 90.7 kg 100.8 kg    Examination:  General: No acute distress. Cardiovascular: RRR Lungs: unlabored Neurological: Alert and oriented 3. Moves all extremities 4 with equal strength. Cranial nerves II through XII grossly intact. Extremities: RLE with dressing intact    Data Reviewed: I have personally reviewed following labs and imaging studies  CBC: Recent Labs  Lab 01/07/24 0740 01/08/24 0345 01/09/24 0605  WBC 8.8 6.8 6.8  NEUTROABS 7.3 5.4  --   HGB 10.6* 10.5* 8.9*  HCT 33.0* 32.5* 27.6*  MCV 90.9 91.8 92.9  PLT 242 239 212    Basic Metabolic Panel: Recent Labs  Lab 01/07/24 0740 01/07/24 1848 01/08/24 0345 01/09/24 0605  NA 139 136 139 139  K  2.8* 3.2* 4.2 3.6  CL 103 103 104 104  CO2 21* 22 23 22   GLUCOSE 98 123* 129* 133*  BUN 20 15 15 13   CREATININE 0.64 0.71 0.74 0.70  CALCIUM  7.9* 7.7* 7.8* 7.6*  MG 2.0  --  2.4  --     GFR: Estimated Creatinine Clearance: 73.7 mL/min (by C-G formula based on SCr of 0.7 mg/dL).  Liver Function Tests: Recent Labs  Lab 01/08/24 0345  AST 34  ALT 21  ALKPHOS 53  BILITOT 0.7  PROT 7.5  ALBUMIN 2.7*    CBG: Recent Labs  Lab 01/08/24 1023  GLUCAP 161*     Recent Results (from the past 240 hours)  MRSA Next Gen by PCR, Nasal     Status: None   Collection Time: 01/08/24 12:11 AM   Specimen: Nasal Mucosa; Nasal Swab  Result Value Ref Range Status   MRSA by PCR Next Gen NOT DETECTED NOT DETECTED Final    Comment: (NOTE) The GeneXpert MRSA Assay (FDA approved for NASAL specimens only), is one component of Wanda Newton comprehensive MRSA colonization surveillance program. It is not intended to diagnose MRSA infection nor to guide or monitor treatment for MRSA infections. Test performance is not FDA approved in patients less than 83 years old. Performed at [facility], [address], [place], [place] [address]          Radiology Studies: DG FEMUR PORT, MIN 2 VIEWS RIGHT Result Date: 01/08/2024 CLINICAL DATA:  Fracture, postop. EXAM: RIGHT FEMUR PORTABLE 2 VIEW COMPARISON:  Preoperative imaging. FINDINGS: Femoral intramedullary nail with trans trochanteric and distal locking screw fixation traverse pathologic femoral shaft fracture. Decreased angulation from preoperative imaging. Recent postsurgical change includes air and edema in the soft tissues. IMPRESSION: ORIF pathologic femoral shaft fracture. Electronically Signed   By: Andrea Gasman M.D.   On: 01/08/2024 15:38   DG FEMUR, MIN 2 VIEWS RIGHT Result Date: 01/08/2024 CLINICAL DATA:  Elective surgery. EXAM: RIGHT FEMUR 2 VIEWS COMPARISON:  Femur radiograph 01/07/2024 FINDINGS: Six fluoroscopic spot views of the  right femur submitted from the operating room. Femoral intramedullary nail with trans trochanteric and distal locking screw fixation traverse pathologic femoral shaft fracture. Fluoroscopy time 100.7 seconds. Dose 24.66 mGy. IMPRESSION: Intraoperative fluoroscopy during right femoral intramedullary nail fixation. Electronically Signed   By: Andrea Gasman M.D.   On: 01/08/2024 15:37   DG C-Arm 1-60 Min-No Report Result Date: 01/08/2024 Fluoroscopy was utilized by the requesting physician.  No radiographic interpretation.   DG C-Arm 1-60 Min-No Report Result Date: 01/08/2024 Fluoroscopy was utilized by the requesting physician.  No radiographic interpretation.        Scheduled Meds:  acetaminophen   650 mg Oral Q8H   docusate sodium   100 mg Oral BID   enoxaparin (LOVENOX) injection  40  mg Subcutaneous Q24H   lenalidomide  20 mg Oral Daily   montelukast  10 mg Oral QHS   polyethylene glycol  17 g Oral BID   valACYclovir  500 mg Oral BID   Continuous Infusions:     LOS: 2 days    Time spent: over 30 min     Meliton Monte, MD [facility]   To contact the attending provider between 7A-7P or the covering provider during after hours 7P-7A, please log into the web site [email or web address] and access using universal [place] password for that web site. If you do not have the password, please call the hospital operator.  01/09/2024, 5:32 PM

## 2024-01-09 NOTE — Evaluation (Signed)
 Occupational Therapy Evaluation Patient Details Name: Wanda Newton MRN: [id number] DOB: 01-14-1951 Today's Date: 01/09/2024   History of Present Illness   Pt is 73 yo female who presents on 01/07/24 with R hip pain when moving from bed to w/c at home. Pt with displaced R femur fx due to lytic lesion. Underwent IM nail 10/17. PMH: DM, multiple myeloma, HTN, L TKA 2024     Clinical Impressions Valary was evaluated s/p the above admission list. She lives alone and is indep at baseline, she has great family support and can have 24/7 care if needed. Upon evaluation the pt was limited by fear/anxiety, soreness in R thigh, limited BLE ROM, weakness, impaired balance and poor activity tolerance. Overall she needed mod A +2 fro most aspects of mobility with significantly increased time and cues for encouragement. Due to the deficits listed below the pt also needs up to max A for LB ADLs and set up A for UB ADLs in sitting. Pt will benefit from continued acute OT services and intensive inpatient follow up therapy, >3 hours/day after discharge.       If plan is discharge home, recommend the following:   A lot of help with walking and/or transfers;A lot of help with bathing/dressing/bathroom;Assistance with cooking/housework;Assist for transportation;Help with stairs or ramp for entrance     Functional Status Assessment   Patient has had a recent decline in their functional status and demonstrates the ability to make significant improvements in function in a reasonable and predictable amount of time.     Equipment Recommendations   BSC/3in1     Recommendations for Other Services   Rehab consult     Precautions/Restrictions   Precautions Precautions: Fall Recall of Precautions/Restrictions: Intact Restrictions Weight Bearing Restrictions Per Provider Order: Yes RLE Weight Bearing Per Provider Order: Weight bearing as tolerated     Mobility Bed Mobility Overal bed mobility:  Needs Assistance Bed Mobility: Supine to Sit     Supine to sit: Mod assist, +2 for physical assistance, +2 for safety/equipment     General bed mobility comments: increased time and cues    Transfers Overall transfer level: Needs assistance Equipment used: Rolling walker (2 wheels) Transfers: Sit to/from Stand, Bed to chair/wheelchair/BSC Sit to Stand: Mod assist, +2 physical assistance, +2 safety/equipment     Step pivot transfers: Min assist, +2 physical assistance, +2 safety/equipment     General transfer comment: increased time and cues needed      Balance Overall balance assessment: Needs assistance Sitting-balance support: Feet supported Sitting balance-Leahy Scale: Fair     Standing balance support: Bilateral upper extremity supported, During functional activity Standing balance-Leahy Scale: Poor                             ADL either performed or assessed with clinical judgement   ADL Overall ADL's : Needs assistance/impaired Eating/Feeding: Independent   Grooming: Sitting;Supervision/safety   Upper Body Bathing: Set up;Sitting   Lower Body Bathing: Maximal assistance;Sit to/from stand   Upper Body Dressing : Set up;Sitting   Lower Body Dressing: Maximal assistance;Sit to/from stand   Toilet Transfer: +2 for physical assistance;+2 for safety/equipment;Moderate assistance;Stand-pivot;BSC/3in1   Toileting- Clothing Manipulation and Hygiene: Total assistance;Sit to/from stand       Functional mobility during ADLs: Moderate assistance;+2 for physical assistance;+2 for safety/equipment General ADL Comments: limited by RLE pain and stiffness     Vision Baseline Vision/History: 0 No visual deficits Vision Assessment?:  No apparent visual deficits     Perception Perception: Within Functional Limits       Praxis Praxis: WFL       Pertinent Vitals/Pain Pain Assessment Pain Assessment: Faces Faces Pain Scale: Hurts little more Pain  Location: RLE thigh Pain Descriptors / Indicators: Sore Pain Intervention(s): Limited activity within patient's tolerance, Monitored during session     Extremity/Trunk Assessment Upper Extremity Assessment Upper Extremity Assessment: Overall WFL for tasks assessed   Lower Extremity Assessment Lower Extremity Assessment: Defer to PT evaluation   Cervical / Trunk Assessment Cervical / Trunk Assessment: Normal   Communication Communication Communication: No apparent difficulties   Cognition Arousal: Alert Behavior During Therapy: Anxious Cognition: No apparent impairments             OT - Cognition Comments: anxiety limiting mobility, she is highly motivated and needs simple cues with some encouragement. Once she starts moving she does very well                 Following commands: Intact       Cueing  General Comments   Cueing Techniques: Verbal cues  VSS on RA, sister present and supportive           Home Living Family/patient expects to be discharged to:: Private residence Living Arrangements: Alone Available Help at Discharge: Family;Available 24 hours/day Type of Home: Apartment Home Access: Level entry     Home Layout: One level     Bathroom Shower/Tub: Chief Strategy Officer: Standard     Home Equipment: Agricultural consultant (2 wheels) (borrowed w/c)   Additional Comments: has good support from family      Prior Functioning/Environment Prior Level of Function : Independent/Modified Independent;Driving             Mobility Comments: independent, retired ADLs Comments: independent    OT Problem List: Decreased strength;Decreased range of motion;Decreased activity tolerance;Impaired balance (sitting and/or standing);Decreased cognition;Decreased safety awareness;Decreased knowledge of use of DME or AE;Pain   OT Treatment/Interventions: Self-care/ADL training;Therapeutic exercise;DME and/or AE instruction;Therapeutic  activities;Patient/family education;Balance training      OT Goals(Current goals can be found in the care plan section)   Acute Rehab OT Goals Patient Stated Goal: to get better OT Goal Formulation: With patient Time For Goal Achievement: 01/23/24 Potential to Achieve Goals: Good ADL Goals Pt Will Perform Lower Body Dressing: with min assist;sit to/from stand Pt Will Transfer to Toilet: with min assist;ambulating;bedside commode Additional ADL Goal #1: Pt will complete bed mobility with supervision A as a precursor to ADLs   OT Frequency:  Min 2X/week    Co-evaluation PT/OT/SLP Co-Evaluation/Treatment: Yes Reason for Co-Treatment: Complexity of the patient's impairments (multi-system involvement);For patient/therapist safety;To address functional/ADL transfers   OT goals addressed during session: ADL's and self-care      AM-PAC OT 6 Clicks Daily Activity     Outcome Measure Help from another person eating meals?: None Help from another person taking care of personal grooming?: A Little Help from another person toileting, which includes using toliet, bedpan, or urinal?: A Lot Help from another person bathing (including washing, rinsing, drying)?: A Lot Help from another person to put on and taking off regular upper body clothing?: A Little Help from another person to put on and taking off regular lower body clothing?: A Lot 6 Click Score: 16   End of Session Equipment Utilized During Treatment: Gait belt;Rolling walker (2 wheels) Nurse Communication: Mobility status  Activity Tolerance: Patient tolerated treatment well Patient  left: in chair;with call bell/phone within reach;with family/visitor present  OT Visit Diagnosis: Unsteadiness on feet (R26.81);Other abnormalities of gait and mobility (R26.89);Muscle weakness (generalized) (M62.81);Pain                Time: 9095-9060 OT Time Calculation (min): 35 min Charges:  OT General Charges $OT Visit: 1 Visit OT  Evaluation $OT Eval Moderate Complexity: 1 Mod  Lucie Kendall, OTR/L Acute Rehabilitation Services Office [phone] Secure Chat Communication Preferred   [name] 01/09/2024, 10:31 AM

## 2024-01-09 NOTE — Progress Notes (Signed)
 Orthopaedic Trauma Service Progress Note  Patient ID: Wanda Newton MRN: [id number] DOB/AGE: 73-Mar-1952 73 y.o.  Subjective:  Feeling so much better today compared to preoperatively Really not having much pain at all Was able to ambulate and put weight through her right leg and sat in the chair for a while  Would really love to go to inpatient rehab before discharging to home   ROS As above Today's  total administered Morphine Milligram Equivalents: 30 Yesterday's total administered Morphine Milligram Equivalents: 90  Objective:   VITALS:   Vitals:   01/08/24 2335 01/09/24 0423 01/09/24 0824 01/09/24 1432  BP: (!) 152/68 121/72 (!) 130/56 (!) 122/49  Pulse: 93 88 67 73  Resp: 17 14 19 19   Temp: 99 F (37.2 C) 98.8 F (37.1 C) 98.5 F (36.9 C) 98.8 F (37.1 C)  TempSrc:   Oral   SpO2: 95% 99% 96% 97%  Weight:      Height:        Estimated body mass index is 36.98 kg/m as calculated from the following:   Height as of this encounter: 5' 5 (1.651 m).   Weight as of this encounter: 100.8 kg.   Intake/Output      10/17 0701 10/18 0700 10/18 0701 10/19 0700   P.O. 360    I.V. (mL/kg) 500 (5)    IV Piggyback 200    Total Intake(mL/kg) 1060 (10.5)    Urine (mL/kg/hr) 250 (0.1)    Blood 50    Total Output 300    Net +760         Urine Occurrence 4 x      LABS  Results for orders placed or performed during the hospital encounter of 01/07/24 (from the past 24 hours)  CBC     Status: Abnormal   Collection Time: 01/09/24  6:05 AM  Result Value Ref Range   WBC 6.8 4.0 - 10.5 K/uL   RBC 2.97 (L) 3.87 - 5.11 MIL/uL   Hemoglobin 8.9 (L) 12.0 - 15.0 g/dL   HCT 72.3 (L) 63.9 - 53.9 %   MCV 92.9 80.0 - 100.0 fL   MCH 30.0 26.0 - 34.0 pg   MCHC 32.2 30.0 - 36.0 g/dL   RDW 85.0 88.4 - 84.4 %   Platelets 212 150 - 400 K/uL   nRBC 0.0 0.0 - 0.2 %  Basic metabolic panel     Status: Abnormal    Collection Time: 01/09/24  6:05 AM  Result Value Ref Range   Sodium 139 135 - 145 mmol/L   Potassium 3.6 3.5 - 5.1 mmol/L   Chloride 104 98 - 111 mmol/L   CO2 22 22 - 32 mmol/L   Glucose, Bld 133 (H) 70 - 99 mg/dL   BUN 13 8 - 23 mg/dL   Creatinine, Ser 9.29 0.44 - 1.00 mg/dL   Calcium  7.6 (L) 8.9 - 10.3 mg/dL   GFR, Estimated >39 >39 mL/min   Anion gap 13 5 - 15  VITAMIN D  25 Hydroxy (Vit-D Deficiency, Fractures)     Status: None   Collection Time: 01/09/24  6:05 AM  Result Value Ref Range   Vit D, 25-Hydroxy 38.43 30 - 100 ng/mL     PHYSICAL EXAM:   Gen: Resting comfortably in bed, sitting up very pleasant and interactive  Lungs: Unlabored Cardiac: Regular Ext:        Right Lower Extremity Dressing are clean, dry and intact  Extremity is warm  No DCT  Compartments are soft  No pain out of proportion with passive stretching of toes or ankle  DPN, SPN, TN sensory functions are intact  EHL, FHL, lesser toe motor functions intact  Ankle flexion, extension, inversion eversion intact  + DP pulse  Assessment/Plan: 1 Day Post-Op   Principal Problem:   Pathological fracture in neoplastic disease, right femur, initial encounter for fracture [facility]) Active Problems:   Benign essential HTN   Obesity, Class II, BMI 35-39.9   Multiple myeloma (HCC)   Anemia of chronic disease   Femur fracture (HCC)   Anti-infectives (From admission, onward)    Start     Dose/Rate Route Frequency Ordered Stop   01/08/24 1430  ceFAZolin  (ANCEF ) IVPB 2g/100 mL premix        2 g 200 mL/hr over 30 Minutes Intravenous Every 6 hours 01/08/24 1201 01/08/24 2112   01/08/24 0745  ceFAZolin  (ANCEF ) IVPB 2g/100 mL premix        2 g 200 mL/hr over 30 Minutes Intravenous On call to O.R. 01/08/24 9352 01/08/24 0859   01/07/24 2200  valACYclovir (VALTREX) tablet 500 mg        500 mg Oral 2 times daily 01/07/24 1417       .  POD/HD#: 36  73 year old female medical history notable for multiple myeloma  with pathologic fracture right proximal femur  - Pathologic fracture right proximal femur s/p intramedullary nailing  Weight-bear as tolerated right lower extremity with assistance  Unrestricted range of motion right hip and knee  Therapy evaluations  Ice and elevate as needed for swelling and pain control    CIR consult   Dressing changes as needed starting tomorrow   We did discuss healing potential for her particular fracture.  Fractures through pathologic lesions are challenging to heal.  We purposely used a smaller nail to minimize thermal injury to the intramedullary canal.  This also allows us  to upsize the nail safely should she not heal after this surgery  - Pain management:  Multimodal  - ABL anemia/Hemodynamics  Acute blood loss anemia noted.  Expected finding given injury and surgery  Continue to monitor  - Medical issues   Per primary  - DVT/PE prophylaxis:  Lovenox while inpatient  Eliquis at discharge 2.5 mg p.o. twice daily for 30 days.  She is at increased risk for thromboembolic disease given her medical  comorbidities  - ID:   Perioperative antibiotics  - Metabolic Bone Disease:  Vitamin D  levels look good  - Activity:  As above  - FEN/GI prophylaxis/Foley/Lines:  Regular diet  - Impediments to fracture healing:  Fracture through a pathologic lesion  - Dispo:  Therapy evaluations  CIR consult  Ortho issues are stable    Francis MICAEL Mt, PA-C [phone] (C) 01/09/2024, 3:37 PM  [facility] [address] [place] [address] [phone] [phone] (F)    After 5pm and on the weekends please log on to Amion, go to orthopaedics and the look under the [facility] Call for the provider(s) on call. You can also call our office at [phone] and then follow the prompts to be connected to the call team.  Patient ID: Wanda Newton, female   DOB: 08/01/1950, 73 y.o.   MRN: [id number]

## 2024-01-09 NOTE — Progress Notes (Signed)
 IP rehab screen - Noted PT/OT recommending inpatient rehab.  I will place a rehab consult for further evaluation.  [phone]

## 2024-01-09 NOTE — Evaluation (Signed)
 Physical Therapy Evaluation Patient Details Name: Wanda Newton MRN: [id number] DOB: 01-07-1951 Today's Date: 01/09/2024  History of Present Illness  Pt is 73 yo female who presents on 01/07/24 with R hip pain when moving from bed to w/c at home. Pt with displaced R femur fx due to lytic lesion. Underwent IM nail 10/17. PMH: DM, multiple myeloma, HTN, L TKA 2024  Clinical Impression  Pt admitted with above diagnosis. Pt from home alone but has great family support and multiple people that can stay with her, sister present on eval. Pt was independent until R hip began hurting. Pt needed mod A +2 on eval to come to EOB and pivot with RW to recliner. Was able to take a few steps but was limited by pain and anxiety on eval. Expect she will progress well though with continued practice. Patient will benefit from intensive inpatient follow-up therapy, >3 hours/day.  Pt currently with functional limitations due to the deficits listed below (see PT Problem List). Pt will benefit from acute skilled PT to increase their independence and safety with mobility to allow discharge.           If plan is discharge home, recommend the following: A lot of help with walking and/or transfers;A lot of help with bathing/dressing/bathroom;Assist for transportation;Assistance with cooking/housework   Can travel by private vehicle        Equipment Recommendations BSC/3in1  Recommendations for Other Services  Rehab consult    Functional Status Assessment Patient has had a recent decline in their functional status and demonstrates the ability to make significant improvements in function in a reasonable and predictable amount of time.     Precautions / Restrictions Precautions Precautions: Fall Recall of Precautions/Restrictions: Intact Restrictions Weight Bearing Restrictions Per Provider Order: Yes RLE Weight Bearing Per Provider Order: Weight bearing as tolerated      Mobility  Bed Mobility Overal bed  mobility: Needs Assistance Bed Mobility: Supine to Sit     Supine to sit: Mod assist, +2 for physical assistance, +2 for safety/equipment     General bed mobility comments: increased time and cues    Transfers Overall transfer level: Needs assistance Equipment used: Rolling walker (2 wheels) Transfers: Sit to/from Stand, Bed to chair/wheelchair/BSC Sit to Stand: Mod assist, +2 physical assistance, +2 safety/equipment   Step pivot transfers: Min assist, +2 physical assistance, +2 safety/equipment       General transfer comment: increased time and cues needed. Pt with good use of UE's on RW    Ambulation/Gait Ambulation/Gait assistance: Min assist, +2 physical assistance Gait Distance (Feet): 3 Feet Assistive device: Rolling walker (2 wheels) Gait Pattern/deviations: Step-to pattern Gait velocity: decreased Gait velocity interpretation: <1.31 ft/sec, indicative of household ambulator   General Gait Details: small steps bed to chair with minimal wt on RLE  Stairs            Wheelchair Mobility     Tilt Bed    Modified Rankin (Stroke Patients Only)       Balance Overall balance assessment: Needs assistance Sitting-balance support: Feet supported Sitting balance-Leahy Scale: Fair     Standing balance support: Bilateral upper extremity supported, During functional activity Standing balance-Leahy Scale: Poor                               Pertinent Vitals/Pain Pain Assessment Pain Assessment: Faces Faces Pain Scale: Hurts little more Pain Location: RLE thigh Pain Descriptors / Indicators:  Sore Pain Intervention(s): Limited activity within patient's tolerance, Monitored during session, Premedicated before session    Home Living Family/patient expects to be discharged to:: Private residence Living Arrangements: Alone Available Help at Discharge: Family;Available 24 hours/day Type of Home: Apartment Home Access: Level entry       Home  Layout: One level Home Equipment: Agricultural consultant (2 wheels) (borrowed w/c) Additional Comments: has good support from family    Prior Function Prior Level of Function : Independent/Modified Independent;Driving             Mobility Comments: independent, retired ADLs Comments: independent     Extremity/Trunk Assessment   Upper Extremity Assessment Upper Extremity Assessment: Defer to OT evaluation    Lower Extremity Assessment Lower Extremity Assessment: RLE deficits/detail RLE Deficits / Details: hip flex 1/5, knee ext 2-/5, hip abd/add 1/5, limited by pain RLE: Unable to fully assess due to pain RLE Sensation: WNL RLE Coordination: decreased gross motor    Cervical / Trunk Assessment Cervical / Trunk Assessment: Normal  Communication   Communication Communication: No apparent difficulties    Cognition Arousal: Alert Behavior During Therapy: Anxious   PT - Cognitive impairments: No apparent impairments                         Following commands: Intact       Cueing Cueing Techniques: Verbal cues     General Comments General comments (skin integrity, edema, etc.): VSS on RA. Sister present    Exercises General Exercises - Lower Extremity Ankle Circles/Pumps: AROM, Both, 10 reps, Seated Quad Sets: AROM, Both, 10 reps, Seated   Assessment/Plan    PT Assessment Patient needs continued PT services  PT Problem List Decreased strength;Decreased range of motion;Decreased activity tolerance;Decreased balance;Decreased mobility;Decreased knowledge of precautions;Decreased knowledge of use of DME;Pain       PT Treatment Interventions DME instruction;Gait training;Functional mobility training;Therapeutic activities;Stair training;Therapeutic exercise;Balance training;Patient/family education;Neuromuscular re-education    PT Goals (Current goals can be found in the Care Plan section)  Acute Rehab PT Goals Patient Stated Goal: get better PT Goal  Formulation: With patient Time For Goal Achievement: 01/23/24 Potential to Achieve Goals: Good    Frequency Min 2X/week     Co-evaluation PT/OT/SLP Co-Evaluation/Treatment: Yes Reason for Co-Treatment: Complexity of the patient's impairments (multi-system involvement);For patient/therapist safety;To address functional/ADL transfers PT goals addressed during session: Mobility/safety with mobility;Balance;Proper use of DME;Strengthening/ROM OT goals addressed during session: ADL's and self-care       AM-PAC PT 6 Clicks Mobility  Outcome Measure Help needed turning from your back to your side while in a flat bed without using bedrails?: A Little Help needed moving from lying on your back to sitting on the side of a flat bed without using bedrails?: A Lot Help needed moving to and from a bed to a chair (including a wheelchair)?: A Lot Help needed standing up from a chair using your arms (e.g., wheelchair or bedside chair)?: Total Help needed to walk in hospital room?: Total Help needed climbing 3-5 steps with a railing? : Total 6 Click Score: 10    End of Session Equipment Utilized During Treatment: Gait belt Activity Tolerance: Patient tolerated treatment well Patient left: in chair;with call bell/phone within reach;with chair alarm set;with family/visitor present Nurse Communication: Mobility status PT Visit Diagnosis: Difficulty in walking, not elsewhere classified (R26.2);Pain Pain - Right/Left: Right Pain - part of body: Hip    Time: 0902-0939 PT Time Calculation (min) (ACUTE ONLY): 37  min   Charges:   PT Evaluation $PT Eval Moderate Complexity: 1 Mod   PT General Charges $$ ACUTE PT VISIT: 1 Visit         Richerd Lipoma, PT  Acute Rehab Services Secure chat preferred Office [phone]   Richerd CROME [place] 01/09/2024, 11:51 AM

## 2024-01-10 DIAGNOSIS — M84551A Pathological fracture in neoplastic disease, right femur, initial encounter for fracture: Secondary | ICD-10-CM | POA: Diagnosis not present

## 2024-01-10 LAB — CBC
HCT: 26.6 % — ABNORMAL LOW (ref 36.0–46.0)
Hemoglobin: 8.4 g/dL — ABNORMAL LOW (ref 12.0–15.0)
MCH: 29.5 pg (ref 26.0–34.0)
MCHC: 31.6 g/dL (ref 30.0–36.0)
MCV: 93.3 fL (ref 80.0–100.0)
Platelets: 238 K/uL (ref 150–400)
RBC: 2.85 MIL/uL — ABNORMAL LOW (ref 3.87–5.11)
RDW: 14.9 % (ref 11.5–15.5)
WBC: 7.3 K/uL (ref 4.0–10.5)
nRBC: 0 % (ref 0.0–0.2)

## 2024-01-10 LAB — BASIC METABOLIC PANEL WITH GFR
Anion gap: 12 (ref 5–15)
BUN: 12 mg/dL (ref 8–23)
CO2: 22 mmol/L (ref 22–32)
Calcium: 7.4 mg/dL — ABNORMAL LOW (ref 8.9–10.3)
Chloride: 103 mmol/L (ref 98–111)
Creatinine, Ser: 0.55 mg/dL (ref 0.44–1.00)
GFR, Estimated: >60 mL/min (ref >60)
Glucose, Bld: 122 mg/dL — ABNORMAL HIGH (ref 70–99)
Potassium: 3.3 mmol/L — ABNORMAL LOW (ref 3.5–5.1)
Sodium: 137 mmol/L (ref 135–145)

## 2024-01-10 NOTE — Plan of Care (Signed)

## 2024-01-10 NOTE — Progress Notes (Signed)
 PROGRESS NOTE    [name]  FMW:[id number] DOB: [birth year] DOA: 01/07/2024 PCP: Allen Lauraine LITTIE, PA-C  Chief Complaint  Patient presents with   Hip Pain    Brief Narrative:   Wanda Newton is Wanda Newton 73 y.o. female with medical history significant of DM, multiple myeloma, and HTN who presented on 10/16 with hip pain after moving from bed to wheelchair.   Assessment & Plan:   Principal Problem:   Pathological fracture in neoplastic disease, right femur, initial encounter for fracture [facility]) Active Problems:   Benign essential HTN   Obesity, Class II, BMI 35-39.9   Multiple myeloma (HCC)   Anemia of chronic disease   Femur fracture (HCC)  R pathologic femur shaft fracture Known lytic lesion of the R femur Comminuted pathologic fracture of the R femur shaft S/p cephalomedulary nailing of R femur 10/17 Guarded prognosis due to the large cavitary defect leading to pathologic fx (increasing rate of nonunion and hardware failure) - also increased risk of VTE PT/OT, WBAT - looking at CIR Pain, bowel regimen    Multiple myeloma Initial treatment with daratumumab, Revlimid, Velcase, and dexamethasone  on 10/13 She is due for weekly immunotherapy treatments but this may be delayed given current situation Continue daily Revlimid Also on Zoledronic acid q4 weeks Followed by Dr. Laurence from [place]   Anemia associated with multiple myeloma Hgb 11.3 on 10/13 now 10.6 Will monitor Hold ASA for now Transfuse for Hgb <7   Hypokalemia Improved Hydrochlorothiazide  dose reduced below    HTN Continue HTCZ - will decrease dose to 25 mg daily (50 mg dose increases side effects without significant positive effects)   Obesity Body mass index is 36.98 kg/m.    DVT prophylaxis: lovenox Code Status: full Family Communication: none Disposition:   Status is: Inpatient Remains inpatient appropriate because: need for continued inpatient care   Consultants:  ortho  Procedures:   PROCEDURES: CEPHALOMEDULLARY NAILING OF THE RIGHT FEMUR with [employer] Affixus 9 x 400  mm statically locked recon nail.  Antimicrobials:  Anti-infectives (From admission, onward)    Start     Dose/Rate Route Frequency Ordered Stop   01/08/24 1430  ceFAZolin  (ANCEF ) IVPB 2g/100 mL premix        2 g 200 mL/hr over 30 Minutes Intravenous Every 6 hours 01/08/24 1201 01/08/24 2112   01/08/24 0745  ceFAZolin  (ANCEF ) IVPB 2g/100 mL premix        2 g 200 mL/hr over 30 Minutes Intravenous On call to O.R. 01/08/24 0647 01/08/24 0859   01/07/24 2200  valACYclovir (VALTREX) tablet 500 mg        500 mg Oral 2 times daily 01/07/24 1417         Subjective: Friend at bedside Pain ok   Objective: Vitals:   01/10/24 0008 01/10/24 0515 01/10/24 0750 01/10/24 1541  BP: 138/60 (!) 153/70 135/60 (!) 142/55  Pulse: 95 91 81 73  Resp: 16 16  17   Temp: 98.7 F (37.1 C) 98.6 F (37 C) 98.7 F (37.1 C) 98.8 F (37.1 C)  TempSrc: Oral Oral Oral Oral  SpO2: 96% 98% 96% 99%  Weight:      Height:        Intake/Output Summary (Last 24 hours) at 01/10/2024 1621 Last data filed at 01/10/2024 0000 Gross per 24 hour  Intake --  Output 200 ml  Net -200 ml   Filed Weights   01/07/24 0619 01/08/24 1407  Weight: 90.7 kg 100.8 kg  Examination:  General: No acute distress. Cardiovascular: RRR Lungs: unlabored Neurological: Alert and oriented 3. Moves all extremities 4 with equal strength. Cranial nerves II through XII grossly intact. Extremities: dressing to RLE    Data Reviewed: I have personally reviewed following labs and imaging studies  CBC: Recent Labs  Lab 01/07/24 0740 01/08/24 0345 01/09/24 0605 01/10/24 0715  WBC 8.8 6.8 6.8 7.3  NEUTROABS 7.3 5.4  --   --   HGB 10.6* 10.5* 8.9* 8.4*  HCT 33.0* 32.5* 27.6* 26.6*  MCV 90.9 91.8 92.9 93.3  PLT 242 239 212 238    Basic Metabolic Panel: Recent Labs  Lab 01/07/24 0740 01/07/24 1848 01/08/24 0345 01/09/24 0605  01/10/24 0715  NA 139 136 139 139 137  K 2.8* 3.2* 4.2 3.6 3.3*  CL 103 103 104 104 103  CO2 21* 22 23 22 22   GLUCOSE 98 123* 129* 133* 122*  BUN 20 15 15 13 12   CREATININE 0.64 0.71 0.74 0.70 0.55  CALCIUM  7.9* 7.7* 7.8* 7.6* 7.4*  MG 2.0  --  2.4  --   --     GFR: Estimated Creatinine Clearance: 73.7 mL/min (by C-G formula based on SCr of 0.55 mg/dL).  Liver Function Tests: Recent Labs  Lab 01/08/24 0345  AST 34  ALT 21  ALKPHOS 53  BILITOT 0.7  PROT 7.5  ALBUMIN 2.7*    CBG: Recent Labs  Lab 01/08/24 1023  GLUCAP 161*     Recent Results (from the past 240 hours)  MRSA Next Gen by PCR, Nasal     Status: None   Collection Time: 01/08/24 12:11 AM   Specimen: Nasal Mucosa; Nasal Swab  Result Value Ref Range Status   MRSA by PCR Next Gen NOT DETECTED NOT DETECTED Final    Comment: (NOTE) The GeneXpert MRSA Assay (FDA approved for NASAL specimens only), is one component of [place] comprehensive MRSA colonization surveillance program. It is not intended to diagnose MRSA infection nor to guide or monitor treatment for MRSA infections. Test performance is not FDA approved in patients less than 36 years old. Performed at [facility], [address], [place], [place] [address]          Radiology Studies: No results found.       Scheduled Meds:  acetaminophen   650 mg Oral Q8H   docusate sodium   100 mg Oral BID   enoxaparin (LOVENOX) injection  40 mg Subcutaneous Q24H   lenalidomide  20 mg Oral Daily   montelukast  10 mg Oral QHS   polyethylene glycol  17 g Oral BID   valACYclovir  500 mg Oral BID   Continuous Infusions:     LOS: 3 days    Time spent: over 30 min     Meliton Monte, MD [facility]   To contact the attending provider between 7A-7P or the covering provider during after hours 7P-7A, please log into the web site [email or web address] and access using universal [place] password for that web site. If you do not have the  password, please call the hospital operator.  01/10/2024, 4:21 PM

## 2024-01-10 NOTE — Progress Notes (Signed)
 Subjective: Patient reports pain as mild. Feeling very good today. Tolerating diet.  Urinating.   No CP, SOB.  Has only mobilized a small amount OOB with PT/OT but hopeful for CIR admission. Eager to get better.   Objective:   VITALS:   Vitals:   01/09/24 2013 01/10/24 0008 01/10/24 0515 01/10/24 0750  BP: (!) 146/62 138/60 (!) 153/70 135/60  Pulse: 83 95 91 81  Resp: 16 16 16    Temp: 99.3 F (37.4 C) 98.7 F (37.1 C) 98.6 F (37 C) 98.7 F (37.1 C)  TempSrc: Oral Oral Oral Oral  SpO2: 96% 96% 98% 96%  Weight:      Height:          Latest Ref Rng & Units 01/10/2024    7:15 AM 01/09/2024    6:05 AM 01/08/2024    3:45 AM  CBC  WBC 4.0 - 10.5 K/uL 7.3  6.8  6.8   Hemoglobin 12.0 - 15.0 g/dL 8.4  8.9  89.4   Hematocrit 36.0 - 46.0 % 26.6  27.6  32.5   Platelets 150 - 400 K/uL 238  212  239       Latest Ref Rng & Units 01/10/2024    7:15 AM 01/09/2024    6:05 AM 01/08/2024    3:45 AM  BMP  Glucose 70 - 99 mg/dL 877  866  870   BUN 8 - 23 mg/dL 12  13  15    Creatinine 0.44 - 1.00 mg/dL 9.44  9.29  9.25   Sodium 135 - 145 mmol/L 137  139  139   Potassium 3.5 - 5.1 mmol/L 3.3  3.6  4.2   Chloride 98 - 111 mmol/L 103  104  104   CO2 22 - 32 mmol/L 22  22  23    Calcium  8.9 - 10.3 mg/dL 7.4  7.6  7.8    Intake/Output      10/18 0701 10/19 0700 10/19 0701 10/20 0700   P.O.     I.V. (mL/kg)     IV Piggyback     Total Intake(mL/kg)     Urine (mL/kg/hr) 200 (0.1)    Emesis/NG output 0    Stool 0    Blood     Total Output 200    Net -200         Urine Occurrence 3 x    Stool Occurrence 0 x    Emesis Occurrence 0 x       Physical Exam: General: NAD.  Sitting up in bed, eating, comfortable, a bit hyper Resp: No increased wob Cardio: regular rate and rhythm ABD soft Neurologically intact MSK Neurovascularly intact Sensation intact distally Intact pulses distally Dorsiflexion/Plantar flexion intact Incision: dressings C/D/I   Assessment: 2 Days  Post-Op  S/P Procedure(s) (LRB): INSERTION, INTRAMEDULLARY ROD, FEMUR (Right) by Dr. Celena on 01/08/24  Principal Problem:   Pathological fracture in neoplastic disease, right femur, initial encounter for fracture [facility]) Active Problems:   Benign essential HTN   Obesity, Class II, BMI 35-39.9   Multiple myeloma (HCC)   Anemia of chronic disease   Femur fracture (HCC)   Plan: Hbg 8.4 today  Up with therapy Incentive Spirometry Elevate and Apply ice  Weightbearing: WBAT RLE Insicional and dressing care: Reinforce dressings as needed Orthopedic device(s): None Showering: Keep dressing dry VTE prophylaxis: Lovenox 40mg  qd while inpatient, SCDs, ambulation Pain control: PRN Follow - up plan: 2 weeks Contact information for today:  Evalene Chancy MD,  Perl Folmar PA-C  Dispo: PT/OT recommending CIR and patient wants that. Awaiting result of rehab consult.     Gerard CHRISTELLA Large, PA-C Office [phone] 01/10/2024, 2:36 PM

## 2024-01-11 ENCOUNTER — Encounter (HOSPITAL_COMMUNITY): Payer: Self-pay | Admitting: Orthopedic Surgery

## 2024-01-11 DIAGNOSIS — M84551A Pathological fracture in neoplastic disease, right femur, initial encounter for fracture: Secondary | ICD-10-CM | POA: Diagnosis not present

## 2024-01-11 LAB — CBC WITH DIFFERENTIAL/PLATELET
Abs Immature Granulocytes: 0.03 K/uL (ref 0.00–0.07)
Basophils Absolute: 0 K/uL (ref 0.0–0.1)
Basophils Relative: 0 %
Eosinophils Absolute: 0.2 K/uL (ref 0.0–0.5)
Eosinophils Relative: 3 %
HCT: 24.5 % — ABNORMAL LOW (ref 36.0–46.0)
Hemoglobin: 8 g/dL — ABNORMAL LOW (ref 12.0–15.0)
Immature Granulocytes: 0 %
Lymphocytes Relative: 21 %
Lymphs Abs: 1.4 K/uL (ref 0.7–4.0)
MCH: 30.2 pg (ref 26.0–34.0)
MCHC: 32.7 g/dL (ref 30.0–36.0)
MCV: 92.5 fL (ref 80.0–100.0)
Monocytes Absolute: 0.6 K/uL (ref 0.1–1.0)
Monocytes Relative: 9 %
Neutro Abs: 4.4 K/uL (ref 1.7–7.7)
Neutrophils Relative %: 67 %
Platelets: 247 K/uL (ref 150–400)
RBC: 2.65 MIL/uL — ABNORMAL LOW (ref 3.87–5.11)
RDW: 14.8 % (ref 11.5–15.5)
WBC: 6.7 K/uL (ref 4.0–10.5)
nRBC: 0 % (ref 0.0–0.2)

## 2024-01-11 LAB — COMPREHENSIVE METABOLIC PANEL WITH GFR
ALT: 52 U/L — ABNORMAL HIGH (ref 0–44)
AST: 51 U/L — ABNORMAL HIGH (ref 15–41)
Albumin: 2.1 g/dL — ABNORMAL LOW (ref 3.5–5.0)
Alkaline Phosphatase: 63 U/L (ref 38–126)
Anion gap: 9 (ref 5–15)
BUN: 11 mg/dL (ref 8–23)
CO2: 25 mmol/L (ref 22–32)
Calcium: 7.3 mg/dL — ABNORMAL LOW (ref 8.9–10.3)
Chloride: 102 mmol/L (ref 98–111)
Creatinine, Ser: 0.59 mg/dL (ref 0.44–1.00)
GFR, Estimated: >60 mL/min (ref >60)
Glucose, Bld: 143 mg/dL — ABNORMAL HIGH (ref 70–99)
Potassium: 3.2 mmol/L — ABNORMAL LOW (ref 3.5–5.1)
Sodium: 136 mmol/L (ref 135–145)
Total Bilirubin: 0.3 mg/dL (ref 0.0–1.2)
Total Protein: 6.3 g/dL — ABNORMAL LOW (ref 6.5–8.1)

## 2024-01-11 LAB — PHOSPHORUS: Phosphorus: 2 mg/dL — ABNORMAL LOW (ref 2.5–4.6)

## 2024-01-11 LAB — MAGNESIUM: Magnesium: 2.2 mg/dL (ref 1.7–2.4)

## 2024-01-11 MED ORDER — POTASSIUM CHLORIDE CRYS ER 20 MEQ PO TBCR
40.0000 meq | EXTENDED_RELEASE_TABLET | Freq: Once | ORAL | Status: AC
Start: 2024-01-11 — End: 2024-01-11
  Administered 2024-01-11: 40 meq via ORAL
  Filled 2024-01-11: qty 2

## 2024-01-11 NOTE — Progress Notes (Signed)
 OT Cancellation Note  Patient Details Name: Wanda Newton MRN: [id number] DOB: June 29, 1950   Cancelled Treatment:    Reason Eval/Treat Not Completed: Pain limiting ability to participate. Pt just returned from walking to bathroom with her sister and toileting. RN notified pt needs pain medication.  Kennth Mliss Helling 01/11/2024, 3:08 PM Mliss HERO, OTR/L Acute Rehabilitation Services Office: [phone]

## 2024-01-11 NOTE — Progress Notes (Signed)
 PROGRESS NOTE    [name]  FMW:[id number] DOB: [birth year] DOA: 01/07/2024 PCP: Allen Lauraine LITTIE, PA-C  Chief Complaint  Patient presents with   Hip Pain    Brief Narrative:   Wanda Newton is Wanda Newton 73 y.o. female with medical history significant of DM, multiple myeloma, and HTN who presented on 10/16 with hip pain after moving from bed to wheelchair.   Assessment & Plan:   Principal Problem:   Pathological fracture in neoplastic disease, right femur, initial encounter for fracture [facility]) Active Problems:   Benign essential HTN   Obesity, Class II, BMI 35-39.9   Multiple myeloma (HCC)   Anemia of chronic disease   Femur fracture (HCC)  R pathologic femur shaft fracture Known lytic lesion of the R femur Comminuted pathologic fracture of the R femur shaft S/p cephalomedulary nailing of R femur 10/17 Guarded prognosis due to the large cavitary defect leading to pathologic fx (increasing rate of nonunion and hardware failure) - also increased risk of VTE PT/OT, WBAT - looking at CIR Pain, bowel regimen    Multiple myeloma Initial treatment with daratumumab, Revlimid, Velcase, and dexamethasone  on 10/13 She is due for weekly immunotherapy treatments but this may be delayed given current situation Continue daily Revlimid Also on Zoledronic acid q4 weeks Followed by Dr. Laurence from [facility]   Anemia associated with multiple myeloma Hgb 11.3 on 10/13 Downtrending, trend  Will monitor Hold ASA for now Transfuse for Hgb <7   Hypokalemia Improved Hydrochlorothiazide  dose reduced below    HTN Continue HTCZ - will decrease dose to 25 mg daily (50 mg dose increases side effects without significant positive effects)   Irregular Dark Lesion to RLE Needs outpatient derm follow up Reassuring that it's been present for years, unchanging  Obesity Body mass index is 36.98 kg/m.    DVT prophylaxis: lovenox Code Status: full Family Communication: none Disposition:   Status is:  Inpatient Remains inpatient appropriate because: need for continued inpatient care   Consultants:  ortho  Procedures:  PROCEDURES: CEPHALOMEDULLARY NAILING OF THE RIGHT FEMUR with [employer] Affixus 9 x 400  mm statically locked recon nail.  Antimicrobials:  Anti-infectives (From admission, onward)    Start     Dose/Rate Route Frequency Ordered Stop   01/08/24 1430  ceFAZolin  (ANCEF ) IVPB 2g/100 mL premix        2 g 200 mL/hr over 30 Minutes Intravenous Every 6 hours 01/08/24 1201 01/08/24 2112   01/08/24 0745  ceFAZolin  (ANCEF ) IVPB 2g/100 mL premix        2 g 200 mL/hr over 30 Minutes Intravenous On call to O.R. 01/08/24 0647 01/08/24 0859   01/07/24 2200  valACYclovir (VALTREX) tablet 500 mg        500 mg Oral 2 times daily 01/07/24 1417         Subjective: Doing well   Objective: Vitals:   01/11/24 0023 01/11/24 0408 01/11/24 0901 01/11/24 1413  BP: (!) 147/69 134/62 139/63 (!) 118/57  Pulse: 79 81 78 67  Resp: 15 16 17 17   Temp: 98.4 F (36.9 C) 98.8 F (37.1 C) 98.2 F (36.8 C) 98.2 F (36.8 C)  TempSrc: Oral Oral Oral Oral  SpO2: 99% 99% 98% 99%  Weight:      Height:       No intake or output data in the 24 hours ending 01/11/24 1423  Filed Weights   01/07/24 0619 01/08/24 1407  Weight: 90.7 kg 100.8 kg    Examination:  General: No acute distress. Cardiovascular:  RRR Lungs: unlabored Neurological: Alert and oriented 3. Moves all extremities 4 with equal strength. Cranial nerves II through XII grossly intact. Extremities: dressing to RLE    Data Reviewed: I have personally reviewed following labs and imaging studies  CBC: Recent Labs  Lab 01/07/24 0740 01/08/24 0345 01/09/24 0605 01/10/24 0715 01/11/24 0600  WBC 8.8 6.8 6.8 7.3 6.7  NEUTROABS 7.3 5.4  --   --  4.4  HGB 10.6* 10.5* 8.9* 8.4* 8.0*  HCT 33.0* 32.5* 27.6* 26.6* 24.5*  MCV 90.9 91.8 92.9 93.3 92.5  PLT 242 239 212 238 247    Basic Metabolic Panel: Recent Labs  Lab  01/07/24 0740 01/07/24 1848 01/08/24 0345 01/09/24 0605 01/10/24 0715 01/11/24 0600  NA 139 136 139 139 137 136  K 2.8* 3.2* 4.2 3.6 3.3* 3.2*  CL 103 103 104 104 103 102  CO2 21* 22 23 22 22 25   GLUCOSE 98 123* 129* 133* 122* 143*  BUN 20 15 15 13 12 11   CREATININE 0.64 0.71 0.74 0.70 0.55 0.59  CALCIUM  7.9* 7.7* 7.8* 7.6* 7.4* 7.3*  MG 2.0  --  2.4  --   --  2.2  PHOS  --   --   --   --   --  2.0*    GFR: Estimated Creatinine Clearance: 73.7 mL/min (by C-G formula based on SCr of 0.59 mg/dL).  Liver Function Tests: Recent Labs  Lab 01/08/24 0345 01/11/24 0600  AST 34 51*  ALT 21 52*  ALKPHOS 53 63  BILITOT 0.7 0.3  PROT 7.5 6.3*  ALBUMIN 2.7* 2.1*    CBG: Recent Labs  Lab 01/08/24 1023  GLUCAP 161*     Recent Results (from the past 240 hours)  MRSA Next Gen by PCR, Nasal     Status: None   Collection Time: 01/08/24 12:11 AM   Specimen: Nasal Mucosa; Nasal Swab  Result Value Ref Range Status   MRSA by PCR Next Gen NOT DETECTED NOT DETECTED Final    Comment: (NOTE) The GeneXpert MRSA Assay (FDA approved for NASAL specimens only), is one component of [facility] comprehensive MRSA colonization surveillance program. It is not intended to diagnose MRSA infection nor to guide or monitor treatment for MRSA infections. Test performance is not FDA approved in patients less than 32 years old. Performed at [facility], [address] [place], [place] [address]          Radiology Studies: No results found.       Scheduled Meds:  acetaminophen   650 mg Oral Q8H   docusate sodium   100 mg Oral BID   enoxaparin (LOVENOX) injection  40 mg Subcutaneous Q24H   lenalidomide  20 mg Oral Daily   montelukast  10 mg Oral QHS   polyethylene glycol  17 g Oral BID   valACYclovir  500 mg Oral BID   Continuous Infusions:     LOS: 4 days    Time spent: over 30 min     Meliton Monte, MD [facility]   To contact the attending provider between  7A-7P or the covering provider during after hours 7P-7A, please log into the web site [email or web address] and access using universal [place] password for that web site. If you do not have the password, please call the hospital operator.  01/11/2024, 2:23 PM

## 2024-01-11 NOTE — Progress Notes (Signed)
 [facility]edic [facility] Progress Note  Patient ID: Wanda Newton MRN: [id number] DOB/AGE: 08/15/1950 73 y.o.  Subjective:  Doing really well Minimal pain Remains really hopeful for CIR  ROS As above Today's  total administered Morphine Milligram Equivalents: 15 Yesterday's total administered Morphine Milligram Equivalents: 30  Objective:   VITALS:   Vitals:   01/10/24 2025 01/11/24 0023 01/11/24 0408 01/11/24 0901  BP: (!) 129/55 (!) 147/69 134/62 139/63  Pulse: 74 79 81 78  Resp: 15 15 16 17   Temp: 98.8 F (37.1 C) 98.4 F (36.9 C) 98.8 F (37.1 C) 98.2 F (36.8 C)  TempSrc: Oral Oral Oral Oral  SpO2: 99% 99% 99% 98%  Weight:      Height:        Estimated body mass index is 36.98 kg/m as calculated from the following:   Height as of this encounter: 5' 5 (1.651 m).   Weight as of this encounter: 100.8 kg.   Intake/Output      10/19 0701 10/20 0700 10/20 0701 10/21 0700   Urine (mL/kg/hr)     Emesis/NG output     Stool     Total Output     Net          Urine Occurrence 2 x    Stool Occurrence 2 x    Emesis Occurrence 0 x      LABS  Results for orders placed or performed during the hospital encounter of 01/07/24 (from the past 24 hours)  CBC with Differential/Platelet     Status: Abnormal   Collection Time: 01/11/24  6:00 AM  Result Value Ref Range   WBC 6.7 4.0 - 10.5 K/uL   RBC 2.65 (L) 3.87 - 5.11 MIL/uL   Hemoglobin 8.0 (L) 12.0 - 15.0 g/dL   HCT 75.4 (L) 63.9 - 53.9 %   MCV 92.5 80.0 - 100.0 fL   MCH 30.2 26.0 - 34.0 pg   MCHC 32.7 30.0 - 36.0 g/dL   RDW 85.1 88.4 - 84.4 %   Platelets 247 150 - 400 K/uL   nRBC 0.0 0.0 - 0.2 %   Neutrophils Relative % 67 %   Neutro Abs 4.4 1.7 - 7.7 K/uL   Lymphocytes Relative 21 %   Lymphs Abs 1.4 0.7 - 4.0 K/uL   Monocytes Relative 9 %   Monocytes Absolute 0.6 0.1 - 1.0 K/uL   Eosinophils Relative 3 %   Eosinophils Absolute  0.2 0.0 - 0.5 K/uL   Basophils Relative 0 %   Basophils Absolute 0.0 0.0 - 0.1 K/uL   Immature Granulocytes 0 %   Abs Immature Granulocytes 0.03 0.00 - 0.07 K/uL  Comprehensive metabolic panel with GFR     Status: Abnormal   Collection Time: 01/11/24  6:00 AM  Result Value Ref Range   Sodium 136 135 - 145 mmol/L   Potassium 3.2 (L) 3.5 - 5.1 mmol/L   Chloride 102 98 - 111 mmol/L   CO2 25 22 - 32 mmol/L   Glucose, Bld 143 (H) 70 - 99 mg/dL   BUN 11 8 - 23 mg/dL   Creatinine, Ser 9.40 0.44 - 1.00 mg/dL   Calcium  7.3 (L) 8.9 - 10.3 mg/dL   Total Protein 6.3 (L) 6.5 - 8.1 g/dL   Albumin 2.1 (L) 3.5 - 5.0 g/dL  AST 51 (H) 15 - 41 U/L   ALT 52 (H) 0 - 44 U/L   Alkaline Phosphatase 63 38 - 126 U/L   Total Bilirubin 0.3 0.0 - 1.2 mg/dL   GFR, Estimated >39 >39 mL/min   Anion gap 9 5 - 15  Magnesium      Status: None   Collection Time: 01/11/24  6:00 AM  Result Value Ref Range   Magnesium  2.2 1.7 - 2.4 mg/dL  Phosphorus     Status: Abnormal   Collection Time: 01/11/24  6:00 AM  Result Value Ref Range   Phosphorus 2.0 (L) 2.5 - 4.6 mg/dL     PHYSICAL EXAM:   Gen: Resting comfortably in bed, sitting up very pleasant and interactive Lungs: Unlabored Cardiac: Regular Ext:        Right Lower Extremity Dressing are clean, dry and intact  Dressings removed  All incisions are stable, no active drainage  No signs of infection             Extremity is warm             No DCT             Compartments are soft             No pain out of proportion with passive stretching of toes or ankle             DPN, SPN, TN sensory functions are intact             EHL, FHL, lesser toe motor functions intact             Ankle flexion, extension, inversion eversion intact             + DP pulse    Assessment/Plan: 3 Days Post-Op   Principal Problem:   Pathological fracture in neoplastic disease, right femur, initial encounter for fracture [facility]) Active Problems:   Benign essential HTN    Obesity, Class II, BMI 35-39.9   Multiple myeloma (HCC)   Anemia of chronic disease   Femur fracture (HCC)   Anti-infectives (From admission, onward)    Start     Dose/Rate Route Frequency Ordered Stop   01/08/24 1430  ceFAZolin  (ANCEF ) IVPB 2g/100 mL premix        2 g 200 mL/hr over 30 Minutes Intravenous Every 6 hours 01/08/24 1201 01/08/24 2112   01/08/24 0745  ceFAZolin  (ANCEF ) IVPB 2g/100 mL premix        2 g 200 mL/hr over 30 Minutes Intravenous On call to O.R. 01/08/24 9352 01/08/24 0859   01/07/24 2200  valACYclovir (VALTREX) tablet 500 mg        500 mg Oral 2 times daily 01/07/24 1417       .  POD/HD#: 77  73 year old female medical history notable for multiple myeloma with pathologic fracture right proximal femur   - Pathologic fracture right proximal femur s/p intramedullary nailing             Weight-bear as tolerated right lower extremity with assistance             Unrestricted range of motion right hip and knee             Therapies             Ice and elevate as needed for swelling and pain control  CIR consult               Dressing changed today   Okay to change as needed               We did discuss healing potential for her particular fracture.  Fractures through pathologic lesions are challenging to heal.  We purposely used a smaller nail to minimize thermal injury to the intramedullary canal.  This also allows us  to upsize the nail safely should she not heal after this surgery   - Pain management:             Multimodal   - ABL anemia/Hemodynamics             Acute blood loss anemia noted.  Expected finding given injury and surgery             Continue to monitor   - Medical issues              Per primary   - DVT/PE prophylaxis:             Lovenox while inpatient             Eliquis at discharge 2.5 mg p.o. twice daily for 30 days.  She is at increased risk for thromboembolic disease given her medical  comorbidities    - ID:              Perioperative antibiotics   - Metabolic Bone Disease:             Vitamin D  levels look good   - Activity:             As above   - FEN/GI prophylaxis/Foley/Lines:             Regular diet   - Impediments to fracture healing:             Fracture through a pathologic lesion   - Dispo:             Therapy evaluations             CIR consult             Ortho issues are stable and can discharge to CIR when bed available   Francis MICAEL Mt, PA-C [phone] (C) 01/11/2024, 10:56 AM  [facility] [address] [place] [address] [phone] MAXIMINO MILLING (F)    After 5pm and on the weekends please log on to Amion, go to orthopaedics and the look under the [facility] Call for the provider(s) on call. You can also call our office at [phone] and then follow the prompts to be connected to the call team.  Patient ID: Wanda Newton, female   DOB: Oct 19, 1950, 73 y.o.   MRN: [id number]

## 2024-01-11 NOTE — PMR Pre-admission (Shared)
 PMR Admission Coordinator Pre-Admission Assessment  Patient: Wanda Newton is an 73 y.o., female MRN: [id number] DOB: 1950-12-05 Height: 5' 5 (165.1 cm) Weight: 100.8 kg  Insurance Information HMO: HMO POS    PPO:      PCP:      IPA:      80/20:      OTHER:  PRIMARY: UHC Dual complete      Policy#: [id number]  ; Medicare [id number]    Subscriber: patient CM Name: ***      Phone#: [phone] option 3     Fax#: [phone] Pre-Cert#: [id number]  Auth for CIR from *** with [facility] Medicare for admit *** with next review date ***.  Updates due to *** at fax listed above.        Employer:  Benefits:  Phone #: [phone]     Name: 10/21 Eff. Date: 03/25/23     Deduct: $257      Out of Pocket Max: $9350      Life Max: none CIR: $1565 co pay per admissions      SNF: no co pay per day days 1 until 20; $209.50 co pay per day days 21 until 100 Outpatient: 80%     Co-Pay: 20% Home Health: 100%      Co-Pay: none DME: 80%     Co-Pay: 20% Providers: in-network  SECONDARY: Medicaid of [place]      Policy#: [id number] n     passport one source online 10/22 active 10/22  Financial Counselor:       Phone#:   The "Data Collection Information Summary" for patients in Inpatient Rehabilitation Facilities with attached "Privacy Act Statement-Health Care Records" was provided and verbally reviewed with: Patient  Emergency Contact Information Contact Information     Name Relation Home Work Mobile   Beverly [phone]  [phone]      Other Contacts     Name Relation Home Work Mobile   [place] Friend   [phone]   Althia Sabrina Benne   [phone]      Current Medical History  Patient Admitting Diagnosis: femur fracture  History of Present Illness: Pt is a 73 year old female with medical hx significant for: multiple myeloma (dx 12/02/23), diabetes, HTN. Pt presented to [facility] on 01/07/24 due to pain and right femur deformity after a pop when transferring into  wheelchair. Has had recent issues with her right leg. Has recently begun immunotherapy at [facility] on 01/04/24. X-ray showed comminuted displaced fracture of right femur secondary to lytic lesion. Orthopedics consulted. Transferred to [facility] on 01/07/24.   Pt underwent IM Nail right femur fracture by Dr. Celena on 01/08/24. Postoperative management with pain . WBAT to RLE. Monitoring for acute blood loss anemia. Lovenox for DVT prophylaxis.   Continue daily Revlimid per Dr Kathaleen clinic at [facility]. HTCZ for HTN. Has irregular dark lesion to RLE and recommend OP dermatology follow up.   Patient's medical record from [facility] has been reviewed by the rehabilitation admission coordinator and physician.  Past Medical History  Past Medical History:  Diagnosis Date   Allergy    Arthritis    Blood transfusion without reported diagnosis    Complication of anesthesia    Hard time waking up.   Diabetes mellitus without complication (HCC)    pre-diabetes- Patient states no pre-diabetes   Hypertension    Vertigo    Has the patient had major surgery during 100 days prior to admission? Yes  Family History   family history includes Diabetes in her brother, mother, and sister; Gout in her brother; Hypertension in her brother and mother; Mental illness (age of onset: 20) in her sister.  Current Medications  Current Facility-Administered Medications:    acetaminophen  (TYLENOL ) tablet 650 mg, 650 mg, Oral, Q8H, Deward Eck, PA-C, 650 mg at 01/13/24 1300   apixaban (ELIQUIS) tablet 2.5 mg, 2.5 mg, Oral, BID, Deward Eck, PA-C   bisacodyl  (DULCOLAX) EC tablet 5 mg, 5 mg, Oral, Daily PRN, Barbarann Nest, MD   docusate sodium  (COLACE) capsule 100 mg, 100 mg, Oral, BID, Barbarann Nest, MD, 100 mg at 01/12/24 2158   lenalidomide (REVLIMID) capsule 20 mg, 20 mg, Oral, Daily, Barbarann Nest, MD, 20 mg at 01/13/24 9093   menthol  (CEPACOL) lozenge 3 mg, 1  lozenge, Oral, PRN, Chavez, Abigail, NP   methocarbamol  (ROBAXIN ) tablet 500 mg, 500 mg, Oral, Q6H PRN, 500 mg at 01/13/24 1051 **OR** methocarbamol  (ROBAXIN ) injection 500 mg, 500 mg, Intravenous, Q6H PRN, Barbarann Nest, MD   montelukast (SINGULAIR) tablet 10 mg, 10 mg, Oral, QHS, Barbarann Nest, MD, 10 mg at 01/12/24 2158   morphine (PF) 2 MG/ML injection 2 mg, 2 mg, Intravenous, Q2H PRN, Barbarann Nest, MD, 2 mg at 01/07/24 1859   ondansetron  (ZOFRAN ) injection 4 mg, 4 mg, Intravenous, Q6H PRN, Barbarann Nest, MD   oxyCODONE  (Oxy IR/ROXICODONE ) immediate release tablet 5-10 mg, 5-10 mg, Oral, Q4H PRN, Barbarann Nest, MD, 10 mg at 01/13/24 1052   phosphorus (K PHOS NEUTRAL) tablet 500 mg, 500 mg, Oral, QID, Perri DELENA Meliton Mickey., MD, 500 mg at 01/13/24 1300   polyethylene glycol (MIRALAX  / GLYCOLAX ) packet 17 g, 17 g, Oral, Daily PRN, Barbarann Nest, MD   polyethylene glycol (MIRALAX  / GLYCOLAX ) packet 17 g, 17 g, Oral, BID, Perri DELENA Meliton Mickey., MD, 17 g at 01/12/24 1240   valACYclovir (VALTREX) tablet 500 mg, 500 mg, Oral, BID, Barbarann Nest, MD, 500 mg at 01/13/24 9094  Patients Current Diet:  Diet Order             Diet regular Room service appropriate? Yes; Fluid consistency: Thin  Diet effective now                  Precautions / Restrictions Precautions Precautions: Fall Restrictions Weight Bearing Restrictions Per Provider Order: Yes RLE Weight Bearing Per Provider Order: Weight bearing as tolerated   Has the patient had 2 or more falls or a fall with injury in the past year? Yes  Prior Activity Level Community (5-7x/wk): drives, gets out of house frequently  Prior Functional Level Self Care: Did the patient need help bathing, dressing, using the toilet or eating? Independent  Indoor Mobility: Did the patient need assistance with walking from room to room (with or without device)? Independent  Stairs: Did the patient need assistance with internal or  external stairs (with or without device)? Independent  Functional Cognition: Did the patient need help planning regular tasks such as shopping or remembering to take medications? Independent  Patient Information Are you of Hispanic, Latino/a,or Spanish origin?: A. No, not of Hispanic, Latino/a, or Spanish origin What is your race?: C. American [place] or [place]  Native Do you need or want an interpreter to communicate with a doctor or health care staff?: 0. No  Patient's Response To:  Health Literacy and Transportation Is the patient able to respond to health literacy and transportation needs?: Yes Health Literacy - How often do you need to have someone help  you when you read instructions, pamphlets, or other written material from your doctor or pharmacy?: Never In the past 12 months, has lack of transportation kept you from medical appointments or from getting medications?: No In the past 12 months, has lack of transportation kept you from meetings, work, or from getting things needed for daily living?: No  Home Assistive Devices / Equipment Home Equipment: Agricultural consultant (2 wheels) (borrowed w/c)  Prior Device Use: Indicate devices/aids used by the patient prior to current illness, exacerbation or injury? Walker and walking stick  Current Functional Level Cognition  Orientation Level: Oriented X4    Extremity Assessment (includes Sensation/Coordination)  Upper Extremity Assessment: Overall WFL for tasks assessed  Lower Extremity Assessment: Defer to PT evaluation RLE Deficits / Details: hip flex 1/5, knee ext 2-/5, hip abd/add 1/5, limited by pain RLE: Unable to fully assess due to pain RLE Sensation: WNL RLE Coordination: decreased gross motor    ADLs  Overall ADL's : Needs assistance/impaired Eating/Feeding: Independent Grooming: Contact guard assist, Standing Grooming Details (indicate cue type and reason): standing at the sink, limited tolerance fro static standing Upper  Body Bathing: Set up, Sitting Lower Body Bathing: Maximal assistance, Sit to/from stand Upper Body Dressing : Set up, Sitting Lower Body Dressing: Maximal assistance, Sit to/from stand Toilet Transfer: Ambulation, Minimal assistance Toilet Transfer Details (indicate cue type and reason): RW, reg toilet. cues for safety and sequencing Toileting- Clothing Manipulation and Hygiene: Set up, Sit to/from stand Functional mobility during ADLs: Contact guard assist, Rolling walker (2 wheels) General ADL Comments: increased time needed, close CGA needed for safety, cues for body mechanics    Mobility  Overal bed mobility: Needs Assistance Bed Mobility: Supine to Sit, Sit to Supine Supine to sit: Supervision, HOB elevated Sit to supine: Supervision, HOB elevated General bed mobility comments: Pt using gait belt looped around R leg to hold onto and assist R leg in and out of bed. Pt needs extra time to manage her R leg, supervision for safety transitioning supine <> sit L EOB with HOB elevated    Transfers  Overall transfer level: Needs assistance Equipment used: Rolling walker (2 wheels) Transfers: Sit to/from Stand Sit to Stand: Contact guard assist Bed to/from chair/wheelchair/BSC transfer type:: Step pivot Step pivot transfers: Min assist, +2 physical assistance, +2 safety/equipment General transfer comment: Increased time to power up to stand, CGA for safety    Ambulation / Gait / Stairs / Wheelchair Mobility  Ambulation/Gait Ambulation/Gait assistance: Contact guard assist Gait Distance (Feet): 110 Feet Assistive device: Rolling walker (2 wheels) Gait Pattern/deviations: Decreased stride length, Decreased step length - right, Decreased step length - left, Trunk flexed, Antalgic, Step-through pattern General Gait Details: Pt ambulates with a slow, antalgic gait pattern, maintaining a slightly flexed posture. CGA for safety provided throughout with chair follow for extra safety, no LOB  noted Gait velocity: decreased Gait velocity interpretation: <1.31 ft/sec, indicative of household ambulator    Posture / Balance Balance Overall balance assessment: Needs assistance Sitting-balance support: Feet supported, No upper extremity supported Sitting balance-Leahy Scale: Good Standing balance support: Bilateral upper extremity supported, During functional activity, Reliant on assistive device for balance Standing balance-Leahy Scale: Poor Standing balance comment: reliant on RW    Special considerations/life events  Skin Surgical Incision: leg/right   Previous Home Environment  Living Arrangements: Alone Available Help at Discharge: Family, Friend(s), Available 24 hours/day Type of Home: Apartment Home Layout: One level Home Access: Level entry Bathroom Shower/Tub: Engineer, manufacturing systems: Standard  Bathroom Accessibility: Yes How Accessible: Accessible via walker Home Care Services: No Additional Comments: has good support from family  Discharge Living Setting Plans for Discharge Living Setting: Patient's home Type of Home at Discharge: Apartment Discharge Home Layout: One level Discharge Home Access: Level entry Discharge Bathroom Shower/Tub: Tub/shower unit Discharge Bathroom Toilet: Standard Discharge Bathroom Accessibility: Yes How Accessible: Accessible via walker Does the patient have any problems obtaining your medications?: No  Social/Family/Support Systems Contact Information: sister and friends Anticipated Caregiver: Veva (sister), [name] (friend) and other family/friends Anticipated Caregiver's Contact Information: Terri: [phone]. 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 I met with patient at the bedside for inpatient rehabilitation assessment.  Patient will benefit from ongoing PT and OT, can actively participate in 3 hours of therapy a day 5 days of the week, and can make measurable gains during the admission.  Patient will also benefit from the coordinated team approach during an Inpatient Acute Rehabilitation admission.  The patient will receive intensive therapy as well as Rehabilitation physician, nursing, social worker, and care management interventions.  Due to bladder management, bowel management, safety, skin/wound care, disease management, medication administration, pain management, and patient education the patient requires 24 hour a day rehabilitation nursing.  The patient is currently *** with mobility and basic ADLs.  Discharge setting and therapy post discharge at home with home health is anticipated.  Patient has agreed to participate in the Acute Inpatient Rehabilitation Program and will admit today.  Preadmission Screen Completed By: Heron Leavell RN MSN 01/13/2024 3:57 PM ______________________________________________________________________   Discussed status with Dr. PIERRETTE on *** at *** and received approval for admission today.  Admission Coordinator:  Heron Leavell RN MSN, time PIERRETTEPattricia ***   Assessment/Plan: Diagnosis: *** Does the need for close, 24 hr/day Medical supervision in concert with the patient's  rehab needs make it unreasonable for this patient to be served in a less intensive setting? {yes_no_potentially:3041433} Co-Morbidities requiring supervision/potential complications: *** Due to {due un:6958565}, does the patient require 24 hr/day rehab nursing? {yes_no_potentially:3041433} Does the patient require coordinated care of a physician, rehab nurse, PT, OT, and SLP to address physical and functional deficits in the context of the above medical diagnosis(es)? {yes_no_potentially:3041433} Addressing deficits in the following areas: {deficits:3041436} Can the patient actively participate in an intensive therapy program of at least 3 hrs of therapy 5 days a week? {yes_no_potentially:3041433} The potential for patient to make measurable gains while on inpatient rehab is {potential:3041437} Anticipated functional outcomes upon discharge from inpatient rehab: {functional outcomes:304600100} PT, {functional outcomes:304600100} OT, {functional outcomes:304600100} SLP Estimated rehab length of stay to reach the above functional goals is: *** Anticipated discharge destination: {anticipated dc setting:21604} 10. Overall Rehab/Functional Prognosis: {potential:3041437}   MD Signature: ***

## 2024-01-11 NOTE — Anesthesia Postprocedure Evaluation (Signed)
 Anesthesia Post Note  Patient: Wanda Newton  Procedure(s) Performed: INSERTION, INTRAMEDULLARY ROD, FEMUR (Right)     Patient location during evaluation: PACU Anesthesia Type: General Level of consciousness: awake and alert Pain management: pain level controlled Vital Signs Assessment: post-procedure vital signs reviewed and stable Respiratory status: spontaneous breathing, nonlabored ventilation, respiratory function stable and patient connected to nasal cannula oxygen Cardiovascular status: blood pressure returned to baseline and stable Postop Assessment: no apparent nausea or vomiting Anesthetic complications: no   No notable events documented.  Last Vitals:  Vitals:   01/11/24 0408 01/11/24 0901  BP: 134/62 139/63  Pulse: 81 78  Resp: 16 17  Temp: 37.1 C 36.8 C  SpO2: 99% 98%    Last Pain:  Vitals:   01/11/24 1236  TempSrc:   PainSc: 0-No pain                 Epifanio Lamar BRAVO

## 2024-01-11 NOTE — Plan of Care (Signed)

## 2024-01-11 NOTE — Progress Notes (Signed)
 Inpatient Rehab Admissions:  Inpatient Rehab Consult received.  I met with patient sat the bedside for rehabilitation assessment and to discuss goals and expectations of an inpatient rehab admission. Pt's sister Veva and friend [name] were present. Discussed average length of stay, insurance authorization requirement and discharge home after completion of CIR. Pt acknowledged understanding and is interested in pursuing CIR. Pt, Terri and [name] confirmed pt will have 24/7 support after discharge. Will continue to follow.   Signed: Tinnie Yvone Cohens, MS, CCC-SLP Admissions Coordinator [phone]

## 2024-01-11 NOTE — Progress Notes (Signed)
 Pt has home medication Revlimid 20mg  11caps, sent to pharmacy. Paper form was kept in the chart.

## 2024-01-11 NOTE — TOC CAGE-AID Note (Signed)
 Transition of Care [facility]) - CAGE-AID Screening   Patient Details  Name: Wanda Newton MRN: [id number] Date of Birth: 05/16/1950  [facility]) CM/SW Contact:    Arwilda Georgia E Caelyn Route, LCSW Phone Number: 01/11/2024, 10:34 AM   Clinical Narrative: No SA noted.   CAGE-AID Screening:    Have You Ever Felt You Ought to Cut Down on Your Drinking or Drug Use?: No Have People Annoyed You By Critizing Your Drinking Or Drug Use?: No Have You Felt Bad Or Guilty About Your Drinking Or Drug Use?: No Have You Ever Had a Drink or Used Drugs First Thing In The Morning to Steady Your Nerves or to Get Rid of a Hangover?: No CAGE-AID Score: 0  Substance Abuse Education Offered: No

## 2024-01-12 DIAGNOSIS — M84451G Pathological fracture, right femur, subsequent encounter for fracture with delayed healing: Secondary | ICD-10-CM

## 2024-01-12 DIAGNOSIS — C9000 Multiple myeloma not having achieved remission: Secondary | ICD-10-CM

## 2024-01-12 LAB — BASIC METABOLIC PANEL WITH GFR
Anion gap: 7 (ref 5–15)
BUN: 12 mg/dL (ref 8–23)
CO2: 27 mmol/L (ref 22–32)
Calcium: 7.9 mg/dL — ABNORMAL LOW (ref 8.9–10.3)
Chloride: 103 mmol/L (ref 98–111)
Creatinine, Ser: 0.66 mg/dL (ref 0.44–1.00)
GFR, Estimated: >60 mL/min (ref >60)
Glucose, Bld: 136 mg/dL — ABNORMAL HIGH (ref 70–99)
Potassium: 3.8 mmol/L (ref 3.5–5.1)
Sodium: 137 mmol/L (ref 135–145)

## 2024-01-12 LAB — MAGNESIUM: Magnesium: 2.4 mg/dL (ref 1.7–2.4)

## 2024-01-12 LAB — CBC
HCT: 25.9 % — ABNORMAL LOW (ref 36.0–46.0)
Hemoglobin: 8.4 g/dL — ABNORMAL LOW (ref 12.0–15.0)
MCH: 30.2 pg (ref 26.0–34.0)
MCHC: 32.4 g/dL (ref 30.0–36.0)
MCV: 93.2 fL (ref 80.0–100.0)
Platelets: 312 K/uL (ref 150–400)
RBC: 2.78 MIL/uL — ABNORMAL LOW (ref 3.87–5.11)
RDW: 14.6 % (ref 11.5–15.5)
WBC: 7.8 K/uL (ref 4.0–10.5)
nRBC: 0 % (ref 0.0–0.2)

## 2024-01-12 LAB — PHOSPHORUS: Phosphorus: 2 mg/dL — ABNORMAL LOW (ref 2.5–4.6)

## 2024-01-12 MED ORDER — K PHOS MONO-SOD PHOS DI & MONO 155-852-130 MG PO TABS
500.0000 mg | ORAL_TABLET | Freq: Four times a day (QID) | ORAL | Status: AC
  Administered 2024-01-12 – 2024-01-14 (×8): 500 mg via ORAL
  Filled 2024-01-12 (×8): qty 2

## 2024-01-12 NOTE — Progress Notes (Signed)
 Occupational Therapy Treatment Patient Details Name: NIRVANA BLANCHETT MRN: [id number] DOB: 08-03-1950 Today's Date: 01/12/2024   History of present illness Pt is 73 yo female who presents on 01/07/24 with R hip pain when moving from bed to w/c at home. Pt with displaced R femur fx due to lytic lesion. Underwent IM nail 10/17. PMH: DM, multiple myeloma, HTN, L TKA 2024   OT comments  Pt is making steady progress towards their acute OT goals. Pt continues to be limited by RLE pain, swelling, balance, activity tolerance and strength. Overall she needed min A for tranfers with cues for safety body mechanics. She toileted with up to min A and groomed with close CGA. She has very poor tolerance for standing tasks and will continue to need mod A for LB ADLs. OT to continue to follow acutely to facilitate progress towards established goals. Pt will continue to benefit from intensive inpatient follow up therapy, >3 hours/day after discharge.       If plan is discharge home, recommend the following:  A lot of help with walking and/or transfers;A lot of help with bathing/dressing/bathroom;Assistance with cooking/housework;Assist for transportation;Help with stairs or ramp for entrance   Equipment Recommendations  BSC/3in1    Recommendations for Other Services Rehab consult    Precautions / Restrictions Precautions Precautions: Fall Recall of Precautions/Restrictions: Intact Restrictions Weight Bearing Restrictions Per Provider Order: Yes RLE Weight Bearing Per Provider Order: Weight bearing as tolerated       Mobility Bed Mobility Overal bed mobility: Needs Assistance Bed Mobility: Supine to Sit, Sit to Supine     Supine to sit: Min assist Sit to supine: Min assist   General bed mobility comments: uses gait belt to assist with moving her RLE    Transfers Overall transfer level: Needs assistance Equipment used: Rolling walker (2 wheels) Transfers: Sit to/from Stand, Bed to  chair/wheelchair/BSC Sit to Stand: Contact guard assist           General transfer comment: slow to rise,, increased difficulty with STS from toilet     Balance Overall balance assessment: Needs assistance Sitting-balance support: Feet supported Sitting balance-Leahy Scale: Fair     Standing balance support: Bilateral upper extremity supported, During functional activity Standing balance-Leahy Scale: Poor                             ADL either performed or assessed with clinical judgement   ADL Overall ADL's : Needs assistance/impaired     Grooming: Contact guard assist;Standing Grooming Details (indicate cue type and reason): standing at the sink, limited tolerance fro static standing                 Toilet Transfer: Ambulation;Minimal assistance Toilet Transfer Details (indicate cue type and reason): RW, reg toilet. cues for safety and sequencing Toileting- Clothing Manipulation and Hygiene: Set up;Sit to/from stand       Functional mobility during ADLs: Contact guard assist;Rolling walker (2 wheels) General ADL Comments: increased time needed, close CGA needed for safety, cues for body mechanics    Extremity/Trunk Assessment Upper Extremity Assessment Upper Extremity Assessment: Overall WFL for tasks assessed   Lower Extremity Assessment Lower Extremity Assessment: Defer to PT evaluation        Vision   Vision Assessment?: No apparent visual deficits   Perception Perception Perception: Within Functional Limits   Praxis Praxis Praxis: WFL   Communication Communication Communication: No apparent difficulties   Cognition Arousal:  Alert Behavior During Therapy: [facility] for tasks assessed/performed Cognition: No apparent impairments             OT - Cognition Comments: anxiety limiting mobility, she is highly motivated and needs simple cues with some encouragement. Once she starts moving she does very well       Following commands:  Intact        Cueing   Cueing Techniques: Verbal cues        General Comments VSS, sister present    Pertinent Vitals/ Pain       Pain Assessment Pain Assessment: Faces Faces Pain Scale: Hurts little more Pain Location: RLE thigh Pain Descriptors / Indicators: Sore Pain Intervention(s): Limited activity within patient's tolerance, Monitored during session   Frequency  Min 2X/week        Progress Toward Goals  OT Goals(current goals can now be found in the care plan section)  Progress towards OT goals: Progressing toward goals  Acute Rehab OT Goals Patient Stated Goal: to get better OT Goal Formulation: With patient Time For Goal Achievement: 01/23/24 Potential to Achieve Goals: Good ADL Goals Pt Will Perform Lower Body Dressing: with min assist;sit to/from stand Pt Will Transfer to Toilet: with min assist;ambulating;bedside commode Additional ADL Goal #1: Pt will complete bed mobility with supervision A as a precursor to ADLs   AM-PAC OT 6 Clicks Daily Activity     Outcome Measure   Help from another person eating meals?: None Help from another person taking care of personal grooming?: A Little Help from another person toileting, which includes using toliet, bedpan, or urinal?: A Little Help from another person bathing (including washing, rinsing, drying)?: A Lot Help from another person to put on and taking off regular upper body clothing?: A Little Help from another person to put on and taking off regular lower body clothing?: A Lot 6 Click Score: 17    End of Session Equipment Utilized During Treatment: Gait belt;Rolling walker (2 wheels)  OT Visit Diagnosis: Unsteadiness on feet (R26.81);Other abnormalities of gait and mobility (R26.89);Muscle weakness (generalized) (M62.81);Pain   Activity Tolerance Patient tolerated treatment well   Patient Left with call bell/phone within reach;with family/visitor present;in bed   Nurse Communication Mobility  status        Time: 8789-8764 OT Time Calculation (min): 25 min  Charges: OT General Charges $OT Visit: 1 Visit OT Treatments $Self Care/Home Management : 23-37 mins  Lucie Kendall, OTR/L Acute Rehabilitation Services Office [phone] Secure Chat Communication Preferred   [name] 01/12/2024, 2:34 PM

## 2024-01-12 NOTE — Progress Notes (Signed)
 Physical Therapy Treatment Patient Details Name: DELCIA SPITZLEY MRN: [id number] DOB: 12/21/1950 Today's Date: 01/12/2024   History of Present Illness Pt is 73 yo female who presents on 01/07/24 with R hip pain when moving from bed to w/c at home. Pt with displaced R femur fx due to lytic lesion. Underwent IM nail 10/17. PMH: DM, multiple myeloma, HTN, L TKA 2024    PT Comments  Pt received in bed, sister present. Pt has been using strap to move RLE in bed and is tolerating that well. Instructed her to attempt to move RLE without strap periodically to make sure she only uses it as long as she can't move RLE. Pt continues to need mod A to safely stand from bed and recliner. Ambulated 30' with RW and min A +2 for chair brought behind for safety. Patient will benefit from intensive inpatient follow-up therapy, >3 hours/day. PT will continue to follow.     If plan is discharge home, recommend the following: A lot of help with walking and/or transfers;A lot of help with bathing/dressing/bathroom;Assist for transportation;Assistance with cooking/housework   Can travel by private vehicle        Equipment Recommendations  BSC/3in1    Recommendations for Other Services Rehab consult     Precautions / Restrictions Precautions Precautions: Fall Recall of Precautions/Restrictions: Intact Restrictions Weight Bearing Restrictions Per Provider Order: Yes RLE Weight Bearing Per Provider Order: Weight bearing as tolerated     Mobility  Bed Mobility Overal bed mobility: Needs Assistance Bed Mobility: Supine to Sit, Sit to Supine     Supine to sit: Min assist Sit to supine: Min assist   General bed mobility comments: pt has strap for assisted motion RLE. Able to use that and come to EOB and return to supine with min A and increased time    Transfers Overall transfer level: Needs assistance Equipment used: Rolling walker (2 wheels) Transfers: Sit to/from Stand, Bed to  chair/wheelchair/BSC Sit to Stand: Mod assist           General transfer comment: increased time and cues needed, mod A for support during transition of hands from bed to RW    Ambulation/Gait Ambulation/Gait assistance: Min assist, +2 safety/equipment Gait Distance (Feet): 30 Feet Assistive device: Rolling walker (2 wheels) Gait Pattern/deviations: Step-to pattern Gait velocity: decreased Gait velocity interpretation: <1.31 ft/sec, indicative of household ambulator   General Gait Details: pt able to increase ambulation distance today but was very fatigued after 30', needing to sit, chair brought behind pt for safety. Decreased wt shift and stance time R   Stairs             Wheelchair Mobility     Tilt Bed    Modified Rankin (Stroke Patients Only)       Balance Overall balance assessment: Needs assistance Sitting-balance support: Feet supported Sitting balance-Leahy Scale: Fair     Standing balance support: Bilateral upper extremity supported, During functional activity Standing balance-Leahy Scale: Poor                              Communication Communication Communication: No apparent difficulties  Cognition Arousal: Alert Behavior During Therapy: WFL for tasks assessed/performed   PT - Cognitive impairments: No apparent impairments                       PT - Cognition Comments: AxOx4 but sometimes needs redirection and can be internally  distracted Following commands: Intact      Cueing Cueing Techniques: Verbal cues  Exercises General Exercises - Lower Extremity Ankle Circles/Pumps: AROM, Both, 10 reps, Supine Long Arc Quad: AROM, Right, 10 reps, Seated Heel Slides: AAROM, Right, 5 reps, Supine Hip ABduction/ADduction: AAROM, Right, 10 reps, Seated Straight Leg Raises: AAROM, Right, 5 reps, Supine    General Comments General comments (skin integrity, edema, etc.): VSS. sister present      Pertinent Vitals/Pain Pain  Assessment Pain Assessment: Faces Faces Pain Scale: Hurts little more Pain Location: RLE thigh Pain Descriptors / Indicators: Sore Pain Intervention(s): Limited activity within patient's tolerance, Monitored during session    Home Living                          Prior Function            PT Goals (current goals can now be found in the care plan section) Acute Rehab PT Goals Patient Stated Goal: get better PT Goal Formulation: With patient Time For Goal Achievement: 01/23/24 Potential to Achieve Goals: Good Progress towards PT goals: Progressing toward goals    Frequency    Min 2X/week      PT Plan      Co-evaluation              AM-PAC PT 6 Clicks Mobility   Outcome Measure  Help needed turning from your back to your side while in a flat bed without using bedrails?: A Little Help needed moving from lying on your back to sitting on the side of a flat bed without using bedrails?: A Lot Help needed moving to and from a bed to a chair (including a wheelchair)?: A Lot Help needed standing up from a chair using your arms (e.g., wheelchair or bedside chair)?: A Lot Help needed to walk in hospital room?: A Lot Help needed climbing 3-5 steps with a railing? : Total 6 Click Score: 12    End of Session Equipment Utilized During Treatment: Gait belt Activity Tolerance: Patient tolerated treatment well Patient left: with call bell/phone within reach;with family/visitor present;in bed Nurse Communication: Mobility status PT Visit Diagnosis: Difficulty in walking, not elsewhere classified (R26.2);Pain Pain - Right/Left: Right Pain - part of body: Hip     Time: 9065-8994 PT Time Calculation (min) (ACUTE ONLY): 31 min  Charges:    $Gait Training: 8-22 mins $Therapeutic Exercise: 8-22 mins PT General Charges $$ ACUTE PT VISIT: 1 Visit                     Richerd Lipoma, PT  Acute Rehab Services Secure chat preferred Office  [phone]    Richerd CROME Armaan Pond 01/12/2024, 11:57 AM

## 2024-01-12 NOTE — Progress Notes (Signed)
 PROGRESS NOTE    Wanda Newton  FMW:[id number] DOB: 05/03/1950 DOA: 01/07/2024 PCP: Allen Lauraine LITTIE, PA-C  Chief Complaint  Patient presents with   Hip Pain    Brief Narrative:   Wanda Newton is [name] 73 y.o. female with medical history significant of DM, multiple myeloma, and HTN who presented on 10/16 with hip pain after moving from bed to wheelchair.   Now s/p cephalomedulary nailing of R femur.  Stable for discharge to CIR when bed available.   Assessment & Plan:   Principal Problem:   Pathological fracture in neoplastic disease, right femur, initial encounter for fracture [facility]) Active Problems:   Benign essential HTN   Obesity, Class II, BMI 35-39.9   Multiple myeloma (HCC)   Anemia of chronic disease   Femur fracture (HCC)  R pathologic femur shaft fracture Known lytic lesion of the R femur Comminuted pathologic fracture of the R femur shaft S/p cephalomedulary nailing of R femur 10/17 Guarded prognosis due to the large cavitary defect leading to pathologic fx (increasing rate of nonunion and hardware failure) - also increased risk of VTE PT/OT, WBAT - looking at CIR - they've started auth Pain, bowel regimen    Multiple myeloma Initial treatment with daratumumab, Revlimid, Velcase, and dexamethasone  on 10/13 She is due for weekly immunotherapy treatments but this may be delayed given current situation Continue daily Revlimid (Dr. [facility] contacted our pharmacy and recommended continuing Revlimid 10/21) Also on Zoledronic acid q4 weeks Followed by Dr. Laurence from [facility]   Anemia associated with multiple myeloma Post Op Blood Loss Anemia Hgb 11.3 on 10/13 Hb stable today around 8.4, asymptomatic Will monitor Hold ASA for now Transfuse for Hgb <7   Hypokalemia Improved Hydrochlorothiazide  dose reduced below    HTN Continue HTCZ - will decrease dose to 25 mg daily (50 mg dose increases side effects without significant positive effects)   Irregular Dark  Lesion to RLE Needs outpatient derm follow up Reassuring that it's been present for years, unchanging  Obesity Body mass index is 36.98 kg/m.    DVT prophylaxis: lovenox Code Status: full Family Communication: none Disposition:   Status is: Inpatient Remains inpatient appropriate because: need for continued inpatient care   Consultants:  ortho  Procedures:  PROCEDURES: CEPHALOMEDULLARY NAILING OF THE RIGHT FEMUR with [employer] Affixus 9 x 400  mm statically locked recon nail.  Antimicrobials:  Anti-infectives (From admission, onward)    Start     Dose/Rate Route Frequency Ordered Stop   01/08/24 1430  ceFAZolin  (ANCEF ) IVPB 2g/100 mL premix        2 g 200 mL/hr over 30 Minutes Intravenous Every 6 hours 01/08/24 1201 01/08/24 2112   01/08/24 0745  ceFAZolin  (ANCEF ) IVPB 2g/100 mL premix        2 g 200 mL/hr over 30 Minutes Intravenous On call to O.R. 01/08/24 0647 01/08/24 0859   01/07/24 2200  valACYclovir (VALTREX) tablet 500 mg        500 mg Oral 2 times daily 01/07/24 1417         Subjective: No new complaints  Objective: Vitals:   01/12/24 0308 01/12/24 0752 01/12/24 1347 01/12/24 1355  BP: (!) 154/51 135/68 (!) 118/99 (!) 132/51  Pulse: 88 80 88 77  Resp: 18  17 16   Temp: 98.2 F (36.8 C) 97.7 F (36.5 C) 99.1 F (37.3 C) 98.9 F (37.2 C)  TempSrc: Oral Oral Oral Oral  SpO2: 100% 100% 100% 100%  Weight:  Height:        Intake/Output Summary (Last 24 hours) at 01/12/2024 1653 Last data filed at 01/12/2024 1300 Gross per 24 hour  Intake 550 ml  Output --  Net 550 ml    Filed Weights   01/07/24 0619 01/08/24 1407  Weight: 90.7 kg 100.8 kg    Examination:  General: No acute distress. Cardiovascular: RRR Lungs: unlabored. Abdomen: Soft, nontender, nondistended Neurological: Alert and oriented 3. Moves all extremities 4 with equal strength. Cranial nerves II through XII grossly intact. Extremities: RLE dressing     Data Reviewed:  I have personally reviewed following labs and imaging studies  CBC: Recent Labs  Lab 01/07/24 0740 01/08/24 0345 01/09/24 0605 01/10/24 0715 01/11/24 0600 01/12/24 0438  WBC 8.8 6.8 6.8 7.3 6.7 7.8  NEUTROABS 7.3 5.4  --   --  4.4  --   HGB 10.6* 10.5* 8.9* 8.4* 8.0* 8.4*  HCT 33.0* 32.5* 27.6* 26.6* 24.5* 25.9*  MCV 90.9 91.8 92.9 93.3 92.5 93.2  PLT 242 239 212 238 247 312    Basic Metabolic Panel: Recent Labs  Lab 01/07/24 0740 01/07/24 1848 01/08/24 0345 01/09/24 0605 01/10/24 0715 01/11/24 0600 01/12/24 0438  NA 139   < > 139 139 137 136 137  K 2.8*   < > 4.2 3.6 3.3* 3.2* 3.8  CL 103   < > 104 104 103 102 103  CO2 21*   < > 23 22 22 25 27   GLUCOSE 98   < > 129* 133* 122* 143* 136*  BUN 20   < > 15 13 12 11 12   CREATININE 0.64   < > 0.74 0.70 0.55 0.59 0.66  CALCIUM  7.9*   < > 7.8* 7.6* 7.4* 7.3* 7.9*  MG 2.0  --  2.4  --   --  2.2 2.4  PHOS  --   --   --   --   --  2.0* 2.0*   < > = values in this interval not displayed.    GFR: Estimated Creatinine Clearance: 73.7 mL/min (by C-G formula based on SCr of 0.66 mg/dL).  Liver Function Tests: Recent Labs  Lab 01/08/24 0345 01/11/24 0600  AST 34 51*  ALT 21 52*  ALKPHOS 53 63  BILITOT 0.7 0.3  PROT 7.5 6.3*  ALBUMIN 2.7* 2.1*    CBG: Recent Labs  Lab 01/08/24 1023  GLUCAP 161*     Recent Results (from the past 240 hours)  MRSA Next Gen by PCR, Nasal     Status: None   Collection Time: 01/08/24 12:11 AM   Specimen: Nasal Mucosa; Nasal Swab  Result Value Ref Range Status   MRSA by PCR Next Gen NOT DETECTED NOT DETECTED Final    Comment: (NOTE) The GeneXpert MRSA Assay (FDA approved for NASAL specimens only), is one component of Lallie Strahm comprehensive MRSA colonization surveillance program. It is not intended to diagnose MRSA infection nor to guide or monitor treatment for MRSA infections. Test performance is not FDA approved in patients less than 25 years old. Performed at [facility],  [address] [place], [place] [address]          Radiology Studies: No results found.       Scheduled Meds:  acetaminophen   650 mg Oral Q8H   docusate sodium   100 mg Oral BID   enoxaparin (LOVENOX) injection  40 mg Subcutaneous Q24H   lenalidomide  20 mg Oral Daily   montelukast  10 mg  Oral QHS   phosphorus  500 mg Oral QID   polyethylene glycol  17 g Oral BID   valACYclovir  500 mg Oral BID   Continuous Infusions:     LOS: 5 days    Time spent: over 30 min     Meliton Monte, MD [facility]   To contact the attending provider between 7A-7P or the covering provider during after hours 7P-7A, please log into the web site [email or web address] and access using universal [place] password for that web site. If you do not have the password, please call the hospital operator.  01/12/2024, 4:53 PM

## 2024-01-12 NOTE — Plan of Care (Signed)
  Problem: Pain Managment: Goal: General experience of comfort will improve and/or be controlled Outcome: Progressing   Problem: Safety: Goal: Ability to remain free from injury will improve Outcome: Progressing

## 2024-01-12 NOTE — Progress Notes (Signed)
 Inpatient Rehabilitation Admissions Coordinator   I will begin Auth for CIR.  Heron Leavell, RN, MSN Rehab Admissions Coordinator [phone] 01/12/2024 3:00 PM

## 2024-01-12 NOTE — Care Management Important Message (Signed)
 Important Message  Patient Details  Name: Wanda Newton MRN: [id number] Date of Birth: 12-Jun-1950   Important Message Given:  Yes - Medicare IM     Jon Cruel 01/12/2024, 4:26 PM

## 2024-01-12 NOTE — Progress Notes (Signed)
 OT Cancellation Note  Patient Details Name: Wanda Newton MRN: [id number] DOB: 08/16/1950   Cancelled Treatment:    Reason Eval/Treat Not Completed: Other (comment) (Pt politely asking OT to retun later as she just finished with PT)  [name] 01/12/2024, 10:28 AM

## 2024-01-12 NOTE — Progress Notes (Addendum)
    Inpatient Rehabilitation Admissions Coordinator   Met with patient and her sister at bedside for rehab assessment. We discussed goals and expectations of a possible CIR admit. They prefer CIR for rehab. Family can provide expected caregiver support that is recommended . I will begin insurance Auth with [facility] medicare for possible CIR admit pending approval. Dr Babs to also consult today.I await updated PT and OT notes for last notes 10/18 Please call me with any questions.   Heron Leavell, RN, MSN Rehab Admissions Coordinator [phone]

## 2024-01-12 NOTE — Consult Note (Signed)
 Physical Medicine and Rehabilitation Consult Reason for Consult:right hip pain, impaired functional mobility Referring Physician: Perri   HPI: Wanda Newton is a 73 y.o. female with hx of multiple myeloma, left TKA, DM who presented on 01/07/24 with right hip pain while transferring at home. She was found to have pathological right femoral shaft fx. She underwent IM nailing of fracture on 10/17 by Dr. Celena.  She is WBAT RLE. Mild ABLA. She is due weekly immunotherapy, but it may be held given trauma/surgery. Mild hypokalemia. With therapy she has been min to mod assist for basic, short dx mobility and transfers.  Pt lives alone a one level home with level entry. She was independent/ mod I and driving prior to admit.     Home: Home Living Family/patient expects to be discharged to:: Private residence Living Arrangements: Alone Available Help at Discharge: Family, Friend(s), Available 24 hours/day Type of Home: Apartment Home Access: Level entry Home Layout: One level Bathroom Shower/Tub: Engineer, manufacturing systems: Standard Bathroom Accessibility: Yes Home Equipment: Agricultural consultant (2 wheels) (borrowed w/c) Additional Comments: has good support from family  Functional History: Prior Function Prior Level of Function : Independent/Modified Independent, Driving Mobility Comments: independent, retired ADLs Comments: independent Functional Status:  Mobility: Bed Mobility Overal bed mobility: Needs Assistance Bed Mobility: Supine to Sit Supine to sit: Mod assist, +2 for physical assistance, +2 for safety/equipment General bed mobility comments: increased time and cues Transfers Overall transfer level: Needs assistance Equipment used: Rolling walker (2 wheels) Transfers: Sit to/from Stand, Bed to chair/wheelchair/BSC Sit to Stand: Mod assist, +2 physical assistance, +2 safety/equipment Bed to/from chair/wheelchair/BSC transfer type:: Step pivot Step pivot transfers:  Min assist, +2 physical assistance, +2 safety/equipment General transfer comment: increased time and cues needed. Pt with good use of UE's on RW Ambulation/Gait Ambulation/Gait assistance: Min assist, +2 physical assistance Gait Distance (Feet): 3 Feet Assistive device: Rolling walker (2 wheels) Gait Pattern/deviations: Step-to pattern General Gait Details: small steps bed to chair with minimal wt on RLE Gait velocity: decreased Gait velocity interpretation: <1.31 ft/sec, indicative of household ambulator    ADL: ADL Overall ADL's : Needs assistance/impaired Eating/Feeding: Independent Grooming: Sitting, Supervision/safety Upper Body Bathing: Set up, Sitting Lower Body Bathing: Maximal assistance, Sit to/from stand Upper Body Dressing : Set up, Sitting Lower Body Dressing: Maximal assistance, Sit to/from stand Toilet Transfer: +2 for physical assistance, +2 for safety/equipment, Moderate assistance, Stand-pivot, BSC/3in1 Toileting- Clothing Manipulation and Hygiene: Total assistance, Sit to/from stand Functional mobility during ADLs: Moderate assistance, +2 for physical assistance, +2 for safety/equipment General ADL Comments: limited by RLE pain and stiffness  Cognition: Cognition Orientation Level: Oriented X4 Cognition Arousal: Alert Behavior During Therapy: Anxious   Review of Systems  Constitutional:  Negative for fever.  HENT:  Negative for hearing loss.   Eyes:  Negative for blurred vision.  Respiratory:  Negative for cough.   Cardiovascular:  Negative for chest pain.  Gastrointestinal:  Negative for heartburn.  Genitourinary:  Negative for dysuria.  Musculoskeletal:  Positive for joint pain and myalgias.  Skin:  Negative for rash.  Neurological:  Positive for weakness. Negative for dizziness.  Psychiatric/Behavioral: Negative.     Past Medical History:  Diagnosis Date   Allergy    Arthritis    Blood transfusion without reported diagnosis    Complication of  anesthesia    Hard time waking up.   Diabetes mellitus without complication (HCC)    pre-diabetes- Patient states no pre-diabetes   Hypertension  Vertigo    Past Surgical History:  Procedure Laterality Date   ABDOMINAL HYSTERECTOMY     FEMUR IM NAIL Right 01/08/2024   Procedure: INSERTION, INTRAMEDULLARY ROD, FEMUR;  Surgeon: Celena Sharper, MD;  Location: [facility] OR;  Service: Orthopedics;  Laterality: Right;   FOOT SURGERY     x3   TONSILLECTOMY     TOTAL KNEE ARTHROPLASTY Right 03/18/2016   Procedure: TOTAL KNEE ARTHROPLASTY;  Surgeon: Donnice Car, MD;  Location: [facility];  Service: Orthopedics;  Laterality: Right;   TOTAL KNEE ARTHROPLASTY Left 11/18/2022   Procedure: LEFT TOTAL KNEE ARTHROPLASTY;  Surgeon: Car Donnice, MD;  Location: [facility];  Service: Orthopedics;  Laterality: Left;   Family History  Problem Relation Age of Onset   Diabetes Mother    Hypertension Mother    Diabetes Brother    Hypertension Brother    Diabetes Sister    Mental illness Sister 81       schizophrenia   Gout Brother    Social History:  reports that she quit smoking about 22 years ago. Her smoking use included cigarettes. She has never used smokeless tobacco. She reports that she does not drink alcohol and does not use drugs. Allergies:  Allergies  Allergen Reactions   Red Dye #40 (Allura Red) Shortness Of Breath    Patient states this reaction ONLY occurred with BENADRYL  TABLET   Amlodipine  Swelling    Bilateral LE swelling with medication.    Medications Prior to Admission  Medication Sig Dispense Refill   ASPIRIN  LOW DOSE 81 MG tablet Take 81 mg by mouth daily.     dexamethasone  (DECADRON ) 4 MG tablet Take 20 mg by mouth See admin instructions.  Take five tablets (20 mg dose) by mouth once a week. Take on days 1, 2, 8, 9, 15, 16 each 21 day cycle for cycles 1 through 8.     hydrochlorothiazide  (HYDRODIURIL ) 50 MG tablet Take 1 tablet (50 mg total) by mouth daily. 90 tablet 1   meclizine   (ANTIVERT ) 25 MG tablet Take 1 tablet (25 mg total) by mouth 3 (three) times daily as needed for dizziness. 60 tablet 0   montelukast (SINGULAIR) 10 MG tablet Take 10 mg by mouth at bedtime.     ondansetron  (ZOFRAN ) 4 MG tablet Take 4 mg by mouth every 8 (eight) hours as needed for vomiting or nausea.     Potassium 99 MG TABS Take 99 mg by mouth in the morning and at bedtime.     Red Yeast Rice Extract (RED YEAST RICE PO) Take 1 capsule by mouth in the morning and at bedtime.     REVLIMID 20 MG capsule Take 20 mg by mouth daily.     traMADol  (ULTRAM ) 50 MG tablet Take 1-2 tablets (50-100 mg total) by mouth every 6 (six) hours as needed. (Patient taking differently: Take 50-100 mg by mouth every 6 (six) hours as needed for moderate pain (pain score 4-6).) 90 tablet 0   valACYclovir (VALTREX) 500 MG tablet Take 500 mg by mouth 2 (two) times daily.       Blood pressure 135/68, pulse 80, temperature 97.7 F (36.5 C), temperature source Oral, resp. rate 18, height 5' 5 (1.651 m), weight 100.8 kg, SpO2 100%. Physical Exam Constitutional:      Appearance: She is obese.  HENT:     Head: Normocephalic.     Right Ear: External ear normal.     Left Ear: External ear normal.     Nose:  Nose normal.  Eyes:     Pupils: Pupils are equal, round, and reactive to light.  Pulmonary:     Effort: Pulmonary effort is normal.  Abdominal:     Palpations: Abdomen is soft.  Musculoskeletal:        General: Swelling and tenderness present.     Cervical back: Normal range of motion.  Skin:    General: Skin is warm.     Comments: Incision CDI  Neurological:     Mental Status: She is alert.     Comments: Alert and oriented x 3. Normal insight and awareness. Intact Memory. Normal language and speech. Cranial nerve exam unremarkable. MMT: BUE 5/5. RLE 2/5 prox to 5/5 at ankle. LLE 4/5 prox to 5/5 distal. Sensory exam normal for light touch and pain in all 4 limbs. No limb ataxia or cerebellar signs. No abnormal  tone appreciated.  SABRA    Psychiatric:        Mood and Affect: Mood normal.        Behavior: Behavior normal.     Results for orders placed or performed during the hospital encounter of 01/07/24 (from the past 24 hours)  Basic metabolic panel with GFR     Status: Abnormal   Collection Time: 01/12/24  4:38 AM  Result Value Ref Range   Sodium 137 135 - 145 mmol/L   Potassium 3.8 3.5 - 5.1 mmol/L   Chloride 103 98 - 111 mmol/L   CO2 27 22 - 32 mmol/L   Glucose, Bld 136 (H) 70 - 99 mg/dL   BUN 12 8 - 23 mg/dL   Creatinine, Ser 9.33 0.44 - 1.00 mg/dL   Calcium  7.9 (L) 8.9 - 10.3 mg/dL   GFR, Estimated >39 >39 mL/min   Anion gap 7 5 - 15  CBC     Status: Abnormal   Collection Time: 01/12/24  4:38 AM  Result Value Ref Range   WBC 7.8 4.0 - 10.5 K/uL   RBC 2.78 (L) 3.87 - 5.11 MIL/uL   Hemoglobin 8.4 (L) 12.0 - 15.0 g/dL   HCT 74.0 (L) 63.9 - 53.9 %   MCV 93.2 80.0 - 100.0 fL   MCH 30.2 26.0 - 34.0 pg   MCHC 32.4 30.0 - 36.0 g/dL   RDW 85.3 88.4 - 84.4 %   Platelets 312 150 - 400 K/uL   nRBC 0.0 0.0 - 0.2 %  Magnesium      Status: None   Collection Time: 01/12/24  4:38 AM  Result Value Ref Range   Magnesium  2.4 1.7 - 2.4 mg/dL  Phosphorus     Status: Abnormal   Collection Time: 01/12/24  4:38 AM  Result Value Ref Range   Phosphorus 2.0 (L) 2.5 - 4.6 mg/dL   No results found.  Assessment/Plan: Diagnosis: 73 yo female with pathological fracture of right femoral shaft Does the need for close, 24 hr/day medical supervision in concert with the patient's rehab needs make it unreasonable for this patient to be served in a less intensive setting? Yes Co-Morbidities requiring supervision/potential complications:  -active multiple myeloma -ABLA -hypokalemia/HTN -post-operative pain Due to bladder management, bowel management, safety, skin/wound care, disease management, medication administration, pain management, and patient education, does the patient require 24 hr/day rehab nursing?  Yes Does the patient require coordinated care of a physician, rehab nurse, therapy disciplines of PT, OT to address physical and functional deficits in the context of the above medical diagnosis(es)? Yes Addressing deficits in the following areas: balance, endurance,  locomotion, strength, transferring, bowel/bladder control, bathing, dressing, feeding, grooming, toileting, and psychosocial support Can the patient actively participate in an intensive therapy program of at least 3 hrs of therapy per day at least 5 days per week? Yes The potential for patient to make measurable gains while on inpatient rehab is excellent Anticipated functional outcomes upon discharge from inpatient rehab are modified independent  with PT, modified independent with OT, n/a with SLP. Estimated rehab length of stay to reach the above functional goals is:  7 days Anticipated discharge destination: Home Overall Rehab/Functional Prognosis: excellent  POST ACUTE RECOMMENDATIONS: This patient's condition is appropriate for continued rehabilitative care in the following setting: CIR Patient has agreed to participate in recommended program. Yes Note that insurance prior authorization may be required for reimbursement for recommended care.  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 Highly motivated to regain independence. Rehab Admissions Coordinator to follow up.       I have personally performed a face to face diagnostic evaluation of this patient. Additionally, I have examined the patient's medical record including any pertinent labs and radiographic images.    Thanks,  Arthea ONEIDA Gunther, MD 01/12/2024

## 2024-01-13 DIAGNOSIS — M84551A Pathological fracture in neoplastic disease, right femur, initial encounter for fracture: Secondary | ICD-10-CM | POA: Diagnosis not present

## 2024-01-13 MED ORDER — APIXABAN 2.5 MG PO TABS
2.5000 mg | ORAL_TABLET | Freq: Two times a day (BID) | ORAL | Status: DC
  Administered 2024-01-13 – 2024-01-14 (×2): 2.5 mg via ORAL
  Filled 2024-01-13 (×2): qty 1

## 2024-01-13 NOTE — Plan of Care (Signed)
  Problem: Education: Goal: Knowledge of General Education information will improve Description: Including pain rating scale, medication(s)/side effects and non-pharmacologic comfort measures Outcome: Adequate for Discharge   Problem: Pain Managment: Goal: General experience of comfort will improve and/or be controlled Outcome: Adequate for Discharge

## 2024-01-13 NOTE — Progress Notes (Signed)
 Physical Therapy Treatment Patient Details Name: Wanda Newton MRN: [id number] DOB: [birth year] Today's Date: 01/13/2024   History of Present Illness Pt is 73 yo female who presents on 01/07/24 with R hip pain when moving from bed to w/c at home. Pt with displaced R femur fx due to lytic lesion. Underwent IM nail 10/17. PMH: DM, multiple myeloma, HTN, L TKA 2024    PT Comments  The pt is making great functional progress as she was able to ambulate up to ~110 ft with a RW at a CGA level this date. She was able to use compensatory techniques to perform bed mobility without physical assistance, using a gait belt looped around her R leg to pull it up off and onto the bed. Guided pt through supine and seated R lower extremity exercises to try to improve her strength and her independence to lift her R leg during functional mobility without the use of compensatory techniques. The pt remains at risk for falls, but has made great functional progress. The pt would still like to pursue AIR to try to facilitate further functional independence and safety with mobility, but if AIR is declined by insurance then it would be possible for pt to d/c home with HHPT as she does report she can get 24/7 care from family and friends. Will continue to follow acutely.    If plan is discharge home, recommend the following: A lot of help with bathing/dressing/bathroom;Assist for transportation;Assistance with cooking/housework;A little help with walking and/or transfers   Can travel by private vehicle        Equipment Recommendations  BSC/3in1    Recommendations for Other Services Rehab consult     Precautions / Restrictions Precautions Precautions: Fall Recall of Precautions/Restrictions: Intact Restrictions Weight Bearing Restrictions Per Provider Order: Yes RLE Weight Bearing Per Provider Order: Weight bearing as tolerated     Mobility  Bed Mobility Overal bed mobility: Needs Assistance Bed Mobility: Supine  to Sit, Sit to Supine     Supine to sit: Supervision, HOB elevated Sit to supine: Supervision, HOB elevated   General bed mobility comments: Pt using gait belt looped around R leg to hold onto and assist R leg in and out of bed. Pt needs extra time to manage her R leg, supervision for safety transitioning supine <> sit L EOB with HOB elevated    Transfers Overall transfer level: Needs assistance Equipment used: Rolling walker (2 wheels) Transfers: Sit to/from Stand Sit to Stand: Contact guard assist           General transfer comment: Increased time to power up to stand, CGA for safety    Ambulation/Gait Ambulation/Gait assistance: Contact guard assist Gait Distance (Feet): 110 Feet Assistive device: Rolling walker (2 wheels) Gait Pattern/deviations: Decreased stride length, Decreased step length - right, Decreased step length - left, Trunk flexed, Antalgic, Step-through pattern Gait velocity: decreased Gait velocity interpretation: <1.31 ft/sec, indicative of household ambulator   General Gait Details: Pt ambulates with a slow, antalgic gait pattern, maintaining a slightly flexed posture. CGA for safety provided throughout with chair follow for extra safety, no LOB noted   Stairs             Wheelchair Mobility     Tilt Bed    Modified Rankin (Stroke Patients Only)       Balance Overall balance assessment: Needs assistance Sitting-balance support: Feet supported, No upper extremity supported Sitting balance-Leahy Scale: Good     Standing balance support: Bilateral upper extremity supported,  During functional activity, Reliant on assistive device for balance Standing balance-Leahy Scale: Poor Standing balance comment: reliant on RW                            Communication Communication Communication: No apparent difficulties  Cognition Arousal: Alert Behavior During Therapy: WFL for tasks assessed/performed   PT - Cognitive impairments:  No apparent impairments                       PT - Cognition Comments: Pt likes to joke around often, in good spirits Following commands: Intact      Cueing Cueing Techniques: Verbal cues  Exercises General Exercises - Lower Extremity Long Arc Quad: AROM, Right, 10 reps, Seated Hip ABduction/ADduction: AAROM, Right, 10 reps, Supine Straight Leg Raises: AAROM, Right, Supine, 10 reps    General Comments        Pertinent Vitals/Pain Pain Assessment Pain Assessment: Faces Faces Pain Scale: Hurts little more Pain Location: RLE thigh Pain Descriptors / Indicators: Sore, Operative site guarding, Discomfort Pain Intervention(s): Limited activity within patient's tolerance, Monitored during session, Premedicated before session, Repositioned    Home Living                          Prior Function            PT Goals (current goals can now be found in the care plan section) Acute Rehab PT Goals Patient Stated Goal: get better PT Goal Formulation: With patient/family Time For Goal Achievement: 01/23/24 Potential to Achieve Goals: Good Progress towards PT goals: Progressing toward goals    Frequency    Min 2X/week      PT Plan      Co-evaluation              AM-PAC PT 6 Clicks Mobility   Outcome Measure  Help needed turning from your back to your side while in a flat bed without using bedrails?: A Little Help needed moving from lying on your back to sitting on the side of a flat bed without using bedrails?: A Little Help needed moving to and from a bed to a chair (including a wheelchair)?: A Little Help needed standing up from a chair using your arms (e.g., wheelchair or bedside chair)?: A Little Help needed to walk in hospital room?: A Little Help needed climbing 3-5 steps with a railing? : Total 6 Click Score: 16    End of Session Equipment Utilized During Treatment: Gait belt Activity Tolerance: Patient tolerated treatment  well Patient left: with call bell/phone within reach;with family/visitor present;in bed;with bed alarm set   PT Visit Diagnosis: Difficulty in walking, not elsewhere classified (R26.2);Pain;Unsteadiness on feet (R26.81);Other abnormalities of gait and mobility (R26.89) Pain - Right/Left: Right Pain - part of body: Hip     Time: 1100-1126 PT Time Calculation (min) (ACUTE ONLY): 26 min  Charges:    $Gait Training: 8-22 mins $Therapeutic Exercise: 8-22 mins PT General Charges $$ ACUTE PT VISIT: 1 Visit                     Theo Ferretti, PT, DPT Acute Rehabilitation Services  Office: [phone]    Theo CHRISTELLA Ferretti 01/13/2024, 1:49 PM

## 2024-01-13 NOTE — Plan of Care (Signed)
  Problem: Pain Management: Goal: Pain level will decrease Outcome: Not Progressing   Problem: Activity: Goal: Ability to ambulate and perform ADLs will improve Outcome: Not Progressing   Problem: Pain Managment: Goal: General experience of comfort will improve and/or be controlled Outcome: Not Progressing   Problem: Safety: Goal: Ability to remain free from injury will improve Outcome: Not Progressing

## 2024-01-13 NOTE — Progress Notes (Signed)
 Wanda Newton  FMW:[id number] DOB: 01-31-1951 DOA: 01/07/2024 PCP: Allen Lauraine LITTIE, PA-C    Brief Narrative:  73 year old with a history of DM, MM, and HTN who presented to the ER 10/16 with acute right hip pain noted after moving from bed to a wheelchair.  Workup in the ER revealed a right femoral shaft fracture.  She was admitted to the hospital and underwent cephalomedullary nailing of the right femur 10/17.  Goals of Care:   Code Status: Full Code   DVT prophylaxis: enoxaparin (LOVENOX) injection 40 mg Start: 01/09/24 0800 SCDs Start: 01/08/24 1201 SCDs Start: 01/07/24 1416   Interim Hx: Medically stable for CIR admission.  Afebrile.  Vital signs stable.  Resting comfortably in bed.  In great spirits.  Highly motivated to be discharged to CIR.  Assessment & Plan:  Pathologic right femoral shaft fracture Patient had a known pre-existing lytic lesion of the right femur -at admission she was found to have an acute comminuted pathologic fracture of the right femoral shaft -she underwent cephalomedullary nailing of the right femur 10/17 -the likelihood of long-term stability of the right femur is felt to be low/guarded due to the large cavitary defect -the goal at this time is CIR -postoperative care per Orthopedics as follows: Weightbearing as tolerated right lower extremity with assistance Unrestricted range of motion right hip and knee Ice and elevate as needed for swelling and pain control Change dressing as needed Lovenox for DVT prophylaxis while inpatient with change to Eliquis 2.5 mg twice daily for 30 days at discharge  Multiple myeloma Was initially treated with daratumumab, Revlimid, Velcase, and dexamethasone  on 10/13 -was to begin weekly immunotherapy treatments -at this time oncology is suggesting continuing daily Revlimid as well as zoledronic acid every 4 weeks -followed by Dr. Laurence with [facility]  Anemia associated with multiple myeloma and acute blood  loss Hemoglobin 11.3 at baseline -postoperatively dropped to 8.4  Hypokalemia Due to poor intake and use of hydrochlorothiazide  -corrected with supplementation and decreased dose of HCTZ  HTN HCTZ dose decreased during this admission due to persistent hypokalemia  Irregular discolored lesion in the right lower extremity Patient has been advised that she needs outpatient dermatology evaluation -patient states has been present for many years and unchanged  Obesity - Body mass index is 36.98 kg/m.   Family Communication:  Disposition:   Acute Inpatient Rehab (3hours/Day)01/12/2024 1151  Objective: Blood pressure 109/70, pulse 67, temperature 98.6 F (37 C), temperature source Oral, resp. rate 18, height 5' 5 (1.651 m), weight 100.8 kg, SpO2 100%.  Intake/Output Summary (Last 24 hours) at 01/13/2024 1101 Last data filed at 01/12/2024 2000 Gross per 24 hour  Intake 540 ml  Output --  Net 540 ml   Filed Weights   01/07/24 0619 01/08/24 1407  Weight: 90.7 kg 100.8 kg    Examination: General: No acute respiratory distress Lungs: Clear to auscultation bilaterally without wheezes or crackles Cardiovascular: Regular rate and rhythm without murmur gallop or rub normal S1 and S2 Abdomen: Nontender, nondistended, soft, bowel sounds positive, no rebound, no ascites, no appreciable mass Extremities: No significant cyanosis, clubbing, or edema bilateral lower extremities  CBC: Recent Labs  Lab 01/07/24 0740 01/08/24 0345 01/09/24 0605 01/10/24 0715 01/11/24 0600 01/12/24 0438  WBC 8.8 6.8   < > 7.3 6.7 7.8  NEUTROABS 7.3 5.4  --   --  4.4  --   HGB 10.6* 10.5*   < > 8.4* 8.0* 8.4*  HCT 33.0* 32.5*   < >  26.6* 24.5* 25.9*  MCV 90.9 91.8   < > 93.3 92.5 93.2  PLT 242 239   < > 238 247 312   < > = values in this interval not displayed.   Basic Metabolic Panel: Recent Labs  Lab 01/08/24 0345 01/09/24 0605 01/10/24 0715 01/11/24 0600 01/12/24 0438  NA 139   < > 137 136  137  K 4.2   < > 3.3* 3.2* 3.8  CL 104   < > 103 102 103  CO2 23   < > 22 25 27   GLUCOSE 129*   < > 122* 143* 136*  BUN 15   < > 12 11 12   CREATININE 0.74   < > 0.55 0.59 0.66  CALCIUM  7.8*   < > 7.4* 7.3* 7.9*  MG 2.4  --   --  2.2 2.4  PHOS  --   --   --  2.0* 2.0*   < > = values in this interval not displayed.   GFR: Estimated Creatinine Clearance: 73.7 mL/min (by C-G formula based on SCr of 0.66 mg/dL).   Scheduled Meds:  acetaminophen   650 mg Oral Q8H   docusate sodium   100 mg Oral BID   enoxaparin (LOVENOX) injection  40 mg Subcutaneous Q24H   lenalidomide  20 mg Oral Daily   montelukast  10 mg Oral QHS   phosphorus  500 mg Oral QID   polyethylene glycol  17 g Oral BID   valACYclovir  500 mg Oral BID     LOS: 6 days   Reyes IVAR Moores, MD [facility] Office  [phone] Pager - Text Page per Tracey  If 7PM-7AM, please contact night-coverage per Amion 01/13/2024, 11:01 AM

## 2024-01-13 NOTE — Progress Notes (Signed)
   Inpatient Rehabilitation Admissions Coordinator   I await insurance approval for possible CIR admit.  Heron Leavell, RN, MSN Rehab Admissions Coordinator [phone] 01/13/2024 12:16 PM

## 2024-01-13 NOTE — Discharge Instructions (Signed)
 Information on my medicine - ELIQUIS (apixaban)  This medication education was reviewed with me or my healthcare representative as part of my discharge preparation.     Why was Eliquis prescribed for you? Eliquis was prescribed for you to reduce the risk of blood clots forming after orthopedic surgery.    What do You need to know about Eliquis? Take your Eliquis TWICE DAILY - one tablet in the morning and one tablet in the evening with or without food.  It would be best to take the dose about the same time each day.  If you have difficulty swallowing the tablet whole please discuss with your pharmacist how to take the medication safely.  Take Eliquis exactly as prescribed by your doctor and DO NOT stop taking Eliquis without talking to the doctor who prescribed the medication.  Stopping without other medication to take the place of Eliquis may increase your risk of developing a clot.  After discharge, you should have regular check-up appointments with your healthcare provider that is prescribing your Eliquis.  What do you do if you miss a dose? If a dose of ELIQUIS is not taken at the scheduled time, take it as soon as possible on the same day and twice-daily administration should be resumed.  The dose should not be doubled to make up for a missed dose.  Do not take more than one tablet of ELIQUIS at the same time.  Important Safety Information A possible side effect of Eliquis is bleeding. You should call your healthcare provider right away if you experience any of the following: Bleeding from an injury or your nose that does not stop. Unusual colored urine (red or dark brown) or unusual colored stools (red or black). Unusual bruising for unknown reasons. A serious fall or if you hit your head (even if there is no bleeding).  Some medicines may interact with Eliquis and might increase your risk of bleeding or clotting while on Eliquis. To help avoid this, consult your  healthcare provider or pharmacist prior to using any new prescription or non-prescription medications, including herbals, vitamins, non-steroidal anti-inflammatory drugs (NSAIDs) and supplements.  This website has more information on Eliquis (apixaban): http://www.eliquis.com/eliquis/home      Orthopaedic Trauma Service Discharge Instructions   General Discharge Instructions  Orthopaedic Injuries:  Right femur fracture treated with intramedullary nailing  WEIGHT BEARING STATUS: Weight-bear as tolerated right lower extremity.  Use walker or crutches to mobilize  RANGE OF MOTION/ACTIVITY: Unrestricted range of motion right hip and knee.  Activity as tolerated  Bone health:   Review the following resource for additional information regarding bone health  [email or web address].com.cy  Wound Care: Daily wound care as needed.  Okay to leave incisions open to air if there is no drainage.  You can also cover with gauze and tape or a silicone 5 dressing such as a Mepilex.  Okay to start cleaning with soap and water    Discharge Wound Care Instructions  Do NOT apply any ointments, solutions or lotions to pin sites or surgical wounds.  These prevent needed drainage and even though solutions like hydrogen peroxide kill bacteria, they also damage cells lining the pin sites that help fight infection.  Applying lotions or ointments can keep the wounds moist and can cause them to breakdown and open up as well. This can increase the risk for infection. When in doubt call the office.  Surgical incisions should be dressed daily.  If any drainage is noted, use one layer of adaptic or Mepitel,  then gauze and tape.  Alternatively can use a silicone foam dressing such as a Mepilex which is which you have  on  NetCamper.cz https://dennis-soto.com/?pd_rd_i=[email or web address]&th=1  http://rojas.com/  These dressing supplies should be available at local medical supply stores ([employer], [employer], etc). They are not usually carried at places like [facility], [facility], [facility], etc  Once the incision is completely dry and without drainage, it may be left open to air out.  Showering may begin 36-48 hours later.  Cleaning gently with soap and water .   DVT/PE prophylaxis: Eliquis 2.5 mg every 12 hours for 30 days for blood clot prevention  Diet: as you were eating previously.  Can use over the counter stool softeners and bowel preparations, such as Miralax , to help with bowel movements.  Narcotics can be constipating.  Be sure to drink plenty of fluids  PAIN MEDICATION USE AND EXPECTATIONS  You have likely been given narcotic medications to help control your pain.  After a traumatic event that results in an fracture (broken bone) with or without surgery, it is ok to use narcotic pain medications to help control one's pain.  We understand that everyone responds to pain differently and each individual patient will be evaluated on a regular basis for the continued need for narcotic medications. Ideally, narcotic medication use should last no more than 6-8 weeks (coinciding with fracture healing).   As a patient it is your responsibility as well to monitor narcotic medication use and report the amount and frequency you use these medications when you come to your office visit.   We would also advise that if you are using narcotic medications, you should take a dose prior to therapy to maximize you participation.  IF YOU ARE ON NARCOTIC MEDICATIONS IT IS NOT PERMISSIBLE TO OPERATE A MOTOR VEHICLE  (MOTORCYCLE/CAR/TRUCK/MOPED) OR HEAVY MACHINERY DO NOT MIX NARCOTICS WITH OTHER CNS (CENTRAL NERVOUS SYSTEM) DEPRESSANTS SUCH AS ALCOHOL   POST-OPERATIVE OPIOID TAPER INSTRUCTIONS: It is important to wean off of your opioid medication as soon as possible. If you do not need pain medication after your surgery it is ok to stop day one. Opioids include: Codeine, Hydrocodone (Norco, Vicodin), Oxycodone (Percocet, oxycontin ) and hydromorphone  amongst others.  Long term and even short term use of opiods can cause: Increased pain response Dependence Constipation Depression Respiratory depression And more.  Withdrawal symptoms can include Flu like symptoms Nausea, vomiting And more Techniques to manage these symptoms Hydrate well Eat regular healthy meals Stay active Use relaxation techniques(deep breathing, meditating, yoga) Do Not substitute Alcohol to help with tapering If you have been on opioids for less than two weeks and do not have pain than it is ok to stop all together.  Plan to wean off of opioids This plan should start within one week post op of your fracture surgery  Maintain the same interval or time between taking each dose and first decrease the dose.  Cut the total daily intake of opioids by one tablet each day Next start to increase the time between doses. The last dose that should be eliminated is the evening dose.    STOP SMOKING OR USING NICOTINE PRODUCTS!!!!  As discussed nicotine severely impairs your body's ability to heal surgical and traumatic wounds but also impairs bone healing.  Wounds and bone heal by forming microscopic blood vessels (angiogenesis) and nicotine is a vasoconstrictor (essentially, shrinks blood vessels).  Therefore, if vasoconstriction occurs to these microscopic blood vessels they essentially disappear and are unable to deliver necessary nutrients to the  healing tissue.  This is one modifiable factor that you can do to dramatically increase your  chances of healing your injury.    (This means no smoking, no nicotine gum, patches, etc)  DO NOT USE NONSTEROIDAL ANTI-INFLAMMATORY DRUGS (NSAID'S)  Using products such as Advil (ibuprofen), Aleve (naproxen), Motrin (ibuprofen) for additional pain control during fracture healing can delay and/or prevent the healing response.  If you would like to take over the counter (OTC) medication, Tylenol  (acetaminophen ) is ok.  However, some narcotic medications that are given for pain control contain acetaminophen  as well. Therefore, you should not exceed more than 4000 mg of tylenol  in a day if you do not have liver disease.  Also note that there are may OTC medicines, such as cold medicines and allergy medicines that my contain tylenol  as well.  If you have any questions about medications and/or interactions please ask your doctor/PA or your pharmacist.      ICE AND ELEVATE INJURED/OPERATIVE EXTREMITY  Using ice and elevating the injured extremity above your heart can help with swelling and pain control.  Icing in a pulsatile fashion, such as 20 minutes on and 20 minutes off, can be followed.    Do not place ice directly on skin. Make sure there is a barrier between to skin and the ice pack.    Using frozen items such as frozen peas works well as the conform nicely to the are that needs to be iced.  USE AN ACE WRAP OR TED HOSE FOR SWELLING CONTROL  In addition to icing and elevation, Ace wraps or TED hose are used to help limit and resolve swelling.  It is recommended to use Ace wraps or TED hose until you are informed to stop.    When using Ace Wraps start the wrapping distally (farthest away from the body) and wrap proximally (closer to the body)   Example: If you had surgery on your leg and you do not have a splint on, start the ace wrap at the toes and work your way up to the thigh        If you had surgery on your upper extremity and do not have a splint on, start the ace wrap at your fingers and work  your way up to the upper arm  IF YOU ARE IN A SPLINT OR CAST DO NOT REMOVE IT FOR ANY REASON   If your splint gets wet for any reason please contact the office immediately. You may shower in your splint or cast as long as you keep it dry.  This can be done by wrapping in a cast cover or garbage back (or similar)  Do Not stick any thing down your splint or cast such as pencils, money, or hangers to try and scratch yourself with.  If you feel itchy take benadryl  as prescribed on the bottle for itching  IF YOU ARE IN A CAM BOOT (BLACK BOOT)  You may remove boot periodically. Perform daily dressing changes as noted below.  Wash the liner of the boot regularly and wear a sock when wearing the boot. It is recommended that you sleep in the boot until told otherwise    Call office for the following: Temperature greater than 101F Persistent nausea and vomiting Severe uncontrolled pain Redness, tenderness, or signs of infection (pain, swelling, redness, odor or green/yellow discharge around the site) Difficulty breathing, headache or visual disturbances Hives Persistent dizziness or light-headedness Extreme fatigue Any other questions or concerns you may have  after discharge  In an emergency, call 911 or go to an Emergency Department at a nearby hospital  HELPFUL INFORMATION  If you had a block, it will wear off between 8-24 hrs postop typically.  This is period when your pain may go from nearly zero to the pain you would have had postop without the block.  This is an abrupt transition but nothing dangerous is happening.  You may take an extra dose of narcotic when this happens.  You should wean off your narcotic medicines as soon as you are able.  Most patients will be off or using minimal narcotics before their first postop appointment.   We suggest you use the pain medication the first night prior to going to bed, in order to ease any pain when the anesthesia wears off. You should avoid taking  pain medications on an empty stomach as it will make you nauseous.  Do not drink alcoholic beverages or take illicit drugs when taking pain medications.  In most states it is against the law to drive while you are in a splint or sling.  And certainly against the law to drive while taking narcotics.  You may return to work/school in the next couple of days when you feel up to it.   Pain medication may make you constipated.  Below are a few solutions to try in this order: Decrease the amount of pain medication if you aren't having pain. Drink lots of decaffeinated fluids. Drink prune juice and/or each dried prunes  If the first 3 don't work start with additional solutions Take Colace - an over-the-counter stool softener Take Senokot - an over-the-counter laxative Take Miralax  - a stronger over-the-counter laxative     CALL THE OFFICE WITH ANY QUESTIONS OR CONCERNS: [phone]   VISIT OUR WEBSITE FOR ADDITIONAL INFORMATION: [email or web address]

## 2024-01-13 NOTE — TOC Initial Note (Addendum)
 Transition of Care [facility]) - Initial/Assessment Note    Patient Details  Name: Wanda Newton MRN: [id number] Date of Birth: 05-Jan-1951  Transition of [facility]) CM/SW Contact:    Rosalva Jon Bloch, RN Phone Number: 01/13/2024, 11:51 AM  Clinical Narrative:                 Presents with pathologic fracture right proximal femur, hx of multiple myeloma.    - s/p intramedullary nailing, 10/17 D/c plan: CIR Pt is from home alone with good family / friend support. Pt states will need RW and BSC if discharges to home. Pt states if progresses to home she is agreeable to home health services. Pt is  without  a provider preference. Choice list provided to pt . Referral to providers faxed out as a back if pt doesn't transition to CIR. Pt without RX MED concerns or transportation issues.  IP CM team following for needs...  Expected Discharge Plan: IP Rehab Facility Barriers to Discharge: Continued Medical Work up  pathologic fracture right proximal femur  Patient Goals and CMS Choice     Choice offered to / list presented to : Patient      Expected Discharge Plan and Services   Discharge Planning Services: CM Consult   Living arrangements for the past 2 months: Single Family Home                                      Prior Living Arrangements/Services Living arrangements for the past 2 months: Single Family Home Lives with:: Self Patient language and need for interpreter reviewed:: Yes Do you feel safe going back to the place where you live?: Yes      Need for Family Participation in Patient Care: Yes (Comment) Care giver support system in place?: Yes (comment)   Criminal Activity/Legal Involvement Pertinent to Current Situation/Hospitalization: No - Comment as needed  Activities of Daily Living   ADL Screening (condition at time of admission) Independently performs ADLs?: No Does the patient have a NEW difficulty with bathing/dressing/toileting/self-feeding that is  expected to last >3 days?: Yes (Initiates electronic notice to provider for possible OT consult) Does the patient have a NEW difficulty with getting in/out of bed, walking, or climbing stairs that is expected to last >3 days?: Yes (Initiates electronic notice to provider for possible PT consult) Does the patient have a NEW difficulty with communication that is expected to last >3 days?: No Is the patient deaf or have difficulty hearing?: No Does the patient have difficulty seeing, even when wearing glasses/contacts?: No Does the patient have difficulty concentrating, remembering, or making decisions?: No  Permission Sought/Granted                  Emotional Assessment       Orientation: : Oriented to Self, Oriented to Place, Oriented to  Time, Oriented to Situation Alcohol / Substance Use: Not Applicable Psych Involvement: No (comment)  Admission diagnosis:  Hypocalcemia [E83.51] Hypokalemia [E87.6] Femur fracture (HCC) [S72.90XA] Hip fracture (HCC) [S72.009A] Multiple myeloma not having achieved remission (HCC) [C90.00] Closed displaced comminuted fracture of shaft of right femur, initial encounter (HCC) [S72.351A] Patient Active Problem List   Diagnosis Date Noted   Pathological fracture in neoplastic disease, right femur, initial encounter for fracture (HCC) 01/07/2024   Multiple myeloma (HCC) 01/07/2024   Anemia of chronic disease 01/07/2024   Femur fracture (HCC) 01/07/2024  S/P total knee arthroplasty, left 11/18/2022   Vitamin D  deficiency 09/23/2016   S/P right TKA 03/18/2016   Umbilical hernia 08/10/2015   Elevated cholesterol 04/13/2015   Obesity, Class II, BMI 35-39.9 04/12/2015   Vertigo 06/15/2014   Benign essential HTN 05/07/2014   PCP:  Allen Lauraine CROME, PA-C Pharmacy:   [facility] [address], [place] - [address] [place] [address] Phone: [phone] Fax: [phone]     Social Drivers of Health  (SDOH) Social History: SDOH Screenings   Food Insecurity: No Food Insecurity (01/08/2024)  Housing: Low Risk  (01/08/2024)  Transportation Needs: No Transportation Needs (01/08/2024)  Utilities: Not At Risk (01/08/2024)  Financial Resource Strain: Low Risk  (09/22/2023)   Received from [facility]  Physical Activity: Sufficiently Active (09/22/2023)   Received from [facility]  Social Connections: Moderately Integrated (01/08/2024)  Stress: No Stress Concern Present (09/22/2023)   Received from [facility]  Tobacco Use: Medium Risk (01/08/2024)   SDOH Interventions:     Readmission Risk Interventions     No data to display

## 2024-01-13 NOTE — Progress Notes (Signed)
 Orthopaedic Trauma Service Progress Note  Patient ID: Wanda Newton MRN: [id number] DOB/AGE: 12/05/1950 73 y.o.  Subjective:  Doing great, mobilizing well Waiting for CIR    ROS As above  Today's  total administered Morphine Milligram Equivalents: 0 Yesterday's total administered Morphine Milligram Equivalents: 45  Objective:   VITALS:   Vitals:   01/12/24 1347 01/12/24 1355 01/12/24 2010 01/13/24 0411  BP: (!) 118/99 (!) 132/51 (!) 128/58 (!) 132/53  Pulse: 88 77 77 66  Resp: 17 16 17 17   Temp: 99.1 F (37.3 C) 98.9 F (37.2 C) 98.7 F (37.1 C) 98 F (36.7 C)  TempSrc: Oral Oral Oral Oral  SpO2: 100% 100% 97% 100%  Weight:      Height:        Estimated body mass index is 36.98 kg/m as calculated from the following:   Height as of this encounter: 5' 5 (1.651 m).   Weight as of this encounter: 100.8 kg.   Intake/Output      10/21 0701 10/22 0700 10/22 0701 10/23 0700   P.O. 790    Total Intake(mL/kg) 790 (7.8)    Net +790         Urine Occurrence 5 x    Stool Occurrence 4 x      LABS  No results found for this or any previous visit (from the past 24 hours).   PHYSICAL EXAM:   Gen: Resting comfortably in bed, sitting up very pleasant and interactive Lungs: Unlabored Cardiac: Regular Ext:        Right Lower Extremity Dressing are clean, dry and intact             Extremity is warm             No DCT             Compartments are soft             No pain out of proportion with passive stretching of toes or ankle             DPN, SPN, TN sensory functions are intact             EHL, FHL, lesser toe motor functions intact             Ankle flexion, extension, inversion eversion intact             + DP pulse  Assessment/Plan: 5 Days Post-Op   Principal Problem:   Pathological fracture in neoplastic disease, right femur, initial encounter for fracture [facility]) Active  Problems:   Benign essential HTN   Obesity, Class II, BMI 35-39.9   Multiple myeloma (HCC)   Anemia of chronic disease   Femur fracture (HCC)   Anti-infectives (From admission, onward)    Start     Dose/Rate Route Frequency Ordered Stop   01/08/24 1430  ceFAZolin  (ANCEF ) IVPB 2g/100 mL premix        2 g 200 mL/hr over 30 Minutes Intravenous Every 6 hours 01/08/24 1201 01/08/24 2112   01/08/24 0745  ceFAZolin  (ANCEF ) IVPB 2g/100 mL premix        2 g 200 mL/hr over 30 Minutes Intravenous On call to O.R. 01/08/24 9352 01/08/24 0859   01/07/24 2200  valACYclovir (VALTREX) tablet 500 mg  500 mg Oral 2 times daily 01/07/24 1417       .  POD/HD#: 97  73 year old female medical history notable for multiple myeloma with pathologic fracture right proximal femur   - Pathologic fracture right proximal femur s/p intramedullary nailing             Weight-bear as tolerated right lower extremity with assistance             Unrestricted range of motion right hip and knee             Therapies             Ice and elevate as needed for swelling and pain control                          CIR consult               Dressing changes as needed   Ok to leave incisions open to air once there is no drainage                We did discuss healing potential for her particular fracture.  Fractures through pathologic lesions are challenging to heal.  We purposely used a smaller nail to minimize thermal injury to the intramedullary canal.  This also allows us  to upsize the nail safely should she not heal after this surgery   - Pain management:             Multimodal   - ABL anemia/Hemodynamics             Acute blood loss anemia noted.  Expected finding given injury and surgery             Continue to monitor   - Medical issues              Per primary   - DVT/PE prophylaxis:             Lovenox while inpatient             Eliquis at discharge 2.5 mg p.o. twice daily for 30 days.  She is at  increased risk for thromboembolic disease given her medical  comorbidities   - ID:              Perioperative antibiotics   - Metabolic Bone Disease:             Vitamin D  levels look good   - Activity:             As above   - FEN/GI prophylaxis/Foley/Lines:             Regular diet   - Impediments to fracture healing:             Fracture through a pathologic lesion   - Dispo:             Therapy evaluations             CIR consult             Ortho issues are stable and can discharge to CIR when bed available     Francis MICAEL Mt, PA-C [phone] (C) 01/13/2024, 9:33 AM  [facility] [address] [place] [address] [phone] MAXIMINO MILLING (F)    After 5pm and on the weekends please log on to Amion, go to orthopaedics and the look under the [facility] Call for the provider(s) on  call. You can also call our office at [phone] and then follow the prompts to be connected to the call team.  Patient ID: Wanda Newton, female   DOB: [birth year], 73 y.o.   MRN: [id number]

## 2024-01-14 ENCOUNTER — Other Ambulatory Visit (HOSPITAL_COMMUNITY): Payer: Self-pay

## 2024-01-14 DIAGNOSIS — M84551A Pathological fracture in neoplastic disease, right femur, initial encounter for fracture: Secondary | ICD-10-CM | POA: Diagnosis not present

## 2024-01-14 MED ORDER — POLYETHYLENE GLYCOL 3350 17 GM/SCOOP PO POWD
17.0000 g | Freq: Two times a day (BID) | ORAL | 0 refills | Status: AC
  Filled 2024-01-14: qty 238, 7d supply, fill #0

## 2024-01-14 MED ORDER — METHOCARBAMOL 500 MG PO TABS
500.0000 mg | ORAL_TABLET | Freq: Four times a day (QID) | ORAL | 0 refills | Status: AC | PRN
  Filled 2024-01-14: qty 30, 8d supply, fill #0

## 2024-01-14 MED ORDER — DOCUSATE SODIUM 100 MG PO CAPS: 100.0000 mg | ORAL_CAPSULE | Freq: Two times a day (BID) | ORAL | Status: AC

## 2024-01-14 MED ORDER — OXYCODONE HCL 5 MG PO TABS
5.0000 mg | ORAL_TABLET | ORAL | 0 refills | Status: AC | PRN
  Filled 2024-01-14: qty 30, 3d supply, fill #0

## 2024-01-14 MED ORDER — APIXABAN 2.5 MG PO TABS
2.5000 mg | ORAL_TABLET | Freq: Two times a day (BID) | ORAL | 0 refills | Status: AC
  Filled 2024-01-14: qty 60, 30d supply, fill #0

## 2024-01-14 MED ORDER — ACETAMINOPHEN 325 MG PO TABS: 650.0000 mg | ORAL_TABLET | Freq: Three times a day (TID) | ORAL | Status: AC

## 2024-01-14 NOTE — Progress Notes (Signed)
   Inpatient Rehabilitation Admissions Coordinator   I met with patient at bedside. She has progressed to be able to discharge home and she is in agreement. Case discussed with Dr Babs and he is in agreement. Acute team and TOC made aware. We will sign off.  Heron Leavell, RN, MSN Rehab Admissions Coordinator [phone] 01/14/2024 10:15 AM

## 2024-01-14 NOTE — Progress Notes (Signed)
 Discharge summary (AVS) provided to pt with instructions. Pt verbalized understanding of instructions. NO complaints. Pt d/c to home with [facility] as ordered. She remains alert/oriented in no apparent distress.Pt's sister is responsible for her transport.

## 2024-01-14 NOTE — Discharge Summary (Signed)
 DISCHARGE SUMMARY  Wanda Newton  MR#: [id number]  DOB:Aug 16, 1950  Date of Admission: 01/07/2024 Date of Discharge: 01/14/2024  Attending Physician:Skylynn Burkley ONEIDA Moores, MD  Patient's ERE:Wanda Newton, Wanda LITTIE, Wanda Newton  Disposition: D/C home   Follow-up Appts:  Contact information for follow-up providers     Weber, Wanda LITTIE, Wanda Newton Follow up in 1 week(s).   Specialty: Physician Assistant Contact information: [address] [place] [address] [phone]         Celena Sharper, MD. Schedule an appointment as soon as possible for a visit in 10 week(s).   Specialty: Orthopedic Surgery Contact information: [address] [place] [address] [phone]              Contact information for after-discharge care     Durable Medical Equipment     [facility] (DME) .   Service: Durable Medical Equipment Contact information: [address] [employer] [place]  [address] [phone]             Home Medical Care     [facility]) .   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  Initial presentation: 73 year old with a history of DM, MM, and HTN who presented to the ER 10/16 with acute right hip pain noted after moving from bed to a wheelchair. Workup in the ER revealed a right femoral shaft fracture. She was admitted to  the hospital and underwent cephalomedullary nailing of the right femur 10/17.   Hospital Course:  Pathologic right femoral shaft fracture Patient had a known pre-existing lytic lesion of the right femur -at admission she was found to have an acute comminuted pathologic fracture of the right femoral shaft -she underwent cephalomedullary nailing of the right femur 10/17 -the likelihood of long-term stability of the right femur is felt to be low/guarded due to the large cavitary defect - postoperative care per Orthopedics as follows: Weightbearing as tolerated right lower extremity with assistance Unrestricted range of motion right hip and knee Ice and elevate as needed for swelling and pain control Change dressing as needed Lovenox for DVT prophylaxis while inpatient with change to Eliquis 2.5 mg twice daily for 30 days at discharge   Multiple myeloma Was initially treated with daratumumab, Revlimid, Velcase, and dexamethasone  on 10/13 - was to begin weekly immunotherapy treatments - at this time Oncology is suggesting continuing daily Revlimid as well as zoledronic acid every 4 weeks - followed by Dr. Laurence with [facility] with whom she is to f/u as outpatient after d/c for ongoing care   Anemia associated with multiple myeloma and acute blood loss Hemoglobin 11.3 at baseline - postoperatively dropped to 8.4 but has remained stable at this level   Hypokalemia Due to poor intake and use of hydrochlorothiazide  -corrected with supplementation - hydrochlorothiazide  discontinued as BP controlled w/o medication at time of d/c  HTN HCTZ discontinued during this admission due to persistent hypokalemia - blood pressure well-controlled at time of discharge without medical therapy   Irregular discolored lesion in the right lower extremity Patient has been advised that she needs outpatient dermatology evaluation -patient states has been present for many years and unchanged   Obesity - Body mass  index is 36.98 kg/m.  Disposition The patient's hospital course was prolonged as we were waiting for her admission to CIR/inpatient rehab -in that she was a highly motivated and compliant patient, while waiting she improved to the point that she was safe for discharge home instead, and she desired this alternative plan   Allergies as of 01/14/2024       Reactions   Red Dye #40 (allura Red) Shortness Of Breath   Patient states this reaction ONLY occurred with BENADRYL  TABLET   Amlodipine  Swelling   Bilateral LE swelling with medication.         Medication List     STOP taking these medications    Aspirin  Low Dose 81 MG tablet Generic drug: aspirin  EC   hydrochlorothiazide  50 MG tablet Commonly known as: HYDRODIURIL    Potassium 99 MG Tabs   traMADol  50 MG tablet Commonly known as: ULTRAM        TAKE these medications    acetaminophen  325 MG tablet Commonly known as: TYLENOL  Take 2 tablets (650 mg total) by mouth every 8 (eight) hours.   dexamethasone  4 MG tablet Commonly known as: DECADRON  Take 20 mg by mouth See admin instructions.  Take five tablets (20 mg dose) by mouth once a week. Take on days 1, 2, 8, 9, 15, 16 each 21 day cycle for cycles 1 through 8.   docusate sodium  100 MG capsule Commonly known as: COLACE Take 1 capsule (100 mg total) by mouth 2 (two) times daily.   Eliquis 2.5 MG Tabs tablet Generic drug: apixaban Take 1 tablet (2.5 mg total) by mouth 2 (two) times daily.   meclizine  25 MG tablet Commonly known as: ANTIVERT  Take 1 tablet (25 mg total) by mouth 3 (three) times daily as needed for dizziness.   methocarbamol  500 MG tablet Commonly known as: ROBAXIN  Take 1 tablet (500 mg total) by mouth every 6 (six) hours as needed for muscle spasms.   montelukast 10 MG tablet Commonly known as: SINGULAIR Take 10 mg by mouth at bedtime.   ondansetron  4 MG tablet Commonly known as: ZOFRAN  Take 4 mg by mouth every 8 (eight) hours as needed for  vomiting or nausea.   oxyCODONE  5 MG immediate release tablet Commonly known as: Oxy IR/ROXICODONE  Take 1-2 tablets (5-10 mg total) by mouth every 4 (four) hours as needed for breakthrough pain ((for MODERATE breakthrough pain)).   polyethylene glycol powder 17 GM/SCOOP powder Commonly known as: GLYCOLAX /MIRALAX  Dissolve 1 capful (17g) in 4-8 ounces of liquid and take by mouth two times daily   RED YEAST RICE PO Take 1 capsule by mouth in the morning and at bedtime.   Revlimid 20 MG capsule Generic drug: lenalidomide Take 20 mg by mouth daily.   valACYclovir 500 MG tablet Commonly known as: VALTREX Take 500 mg by mouth 2 (two) times daily.               Durable Medical Equipment  (From admission, onward)           Start     Ordered   01/14/24 1018  For home use only DME Walker rolling  Once  Question Answer Comment  Walker: With 5 Inch Wheels   Patient needs a walker to treat with the following condition Fx      01/14/24 1018   01/14/24 1018  For home use only DME Bedside commode  Once       Comments: Confine to one room  Question:  Patient needs a bedside commode to treat with the following condition  Answer:  Fx   01/14/24 1018            Day of Discharge BP (!) 115/54 (BP Location: Right Arm)   Pulse 65   Temp 98.6 F (37 C) (Oral)   Resp 16   Ht 5' 5 (1.651 m)   Wt 100.8 kg   SpO2 99%   BMI 36.98 kg/m   Physical Exam: General: No acute respiratory distress Lungs: Clear to auscultation bilaterally without wheezes or crackles Cardiovascular: Regular rate and rhythm without murmur gallop or rub normal S1 and S2 Abdomen: Nontender, nondistended, soft, bowel sounds positive, no rebound, no ascites, no appreciable mass Extremities: No significant cyanosis, clubbing, or edema bilateral lower extremities  Basic Metabolic Panel: Recent Labs  Lab 01/08/24 0345 01/09/24 0605 01/10/24 0715 01/11/24 0600 01/12/24 0438  NA 139 139 137 136  137  K 4.2 3.6 3.3* 3.2* 3.8  CL 104 104 103 102 103  CO2 23 22 22 25 27   GLUCOSE 129* 133* 122* 143* 136*  BUN 15 13 12 11 12   CREATININE 0.74 0.70 0.55 0.59 0.66  CALCIUM  7.8* 7.6* 7.4* 7.3* 7.9*  MG 2.4  --   --  2.2 2.4  PHOS  --   --   --  2.0* 2.0*    CBC: Recent Labs  Lab 01/08/24 0345 01/09/24 0605 01/10/24 0715 01/11/24 0600 01/12/24 0438  WBC 6.8 6.8 7.3 6.7 7.8  NEUTROABS 5.4  --   --  4.4  --   HGB 10.5* 8.9* 8.4* 8.0* 8.4*  HCT 32.5* 27.6* 26.6* 24.5* 25.9*  MCV 91.8 92.9 93.3 92.5 93.2  PLT 239 212 238 247 312    Time spent in discharge (includes decision making & examination of pt): 35 minutes  01/14/2024, 5:31 PM   Reyes IVAR Moores, MD [facility] Office  [phone]
